# Patient Record
Sex: Female | Born: 1955 | Race: White | Hispanic: No | Marital: Married | Smoking: Current every day smoker
Health system: Southern US, Community
[De-identification: ages and names within clinical notes are randomized; demographics above are authoritative.]

## PROBLEM LIST (undated history)

## (undated) DIAGNOSIS — I471 Supraventricular tachycardia, unspecified: Secondary | ICD-10-CM

## (undated) DIAGNOSIS — C50919 Malignant neoplasm of unspecified site of unspecified female breast: Secondary | ICD-10-CM

## (undated) HISTORY — PX: TUBAL LIGATION: SHX77

## (undated) HISTORY — PX: CHOLECYSTECTOMY: SHX55

## (undated) HISTORY — PX: BACK SURGERY: SHX140

## (undated) HISTORY — PX: CARPAL TUNNEL RELEASE: SHX101

---

## 2015-08-24 ENCOUNTER — Emergency Department (HOSPITAL_COMMUNITY): Payer: Self-pay

## 2015-08-24 ENCOUNTER — Emergency Department (HOSPITAL_COMMUNITY)
Admission: EM | Admit: 2015-08-24 | Discharge: 2015-08-24 | Disposition: A | Discharge status: Home / Self Care | Payer: Self-pay | Attending: Emergency Medicine | Admitting: Emergency Medicine

## 2015-08-24 ENCOUNTER — Encounter (HOSPITAL_COMMUNITY): Payer: Self-pay | Admitting: Emergency Medicine

## 2015-08-24 DIAGNOSIS — Z8679 Personal history of other diseases of the circulatory system: Secondary | ICD-10-CM | POA: Insufficient documentation

## 2015-08-24 DIAGNOSIS — M7981 Nontraumatic hematoma of soft tissue: Secondary | ICD-10-CM | POA: Insufficient documentation

## 2015-08-24 DIAGNOSIS — F17200 Nicotine dependence, unspecified, uncomplicated: Secondary | ICD-10-CM | POA: Insufficient documentation

## 2015-08-24 DIAGNOSIS — M25532 Pain in left wrist: Secondary | ICD-10-CM | POA: Insufficient documentation

## 2015-08-24 DIAGNOSIS — T148XXA Other injury of unspecified body region, initial encounter: Secondary | ICD-10-CM

## 2015-08-24 HISTORY — DX: Supraventricular tachycardia: I47.1

## 2015-08-24 HISTORY — DX: Supraventricular tachycardia, unspecified: I47.10

## 2015-08-24 MED ORDER — ACETAMINOPHEN 325 MG PO TABS
650.0000 mg | ORAL_TABLET | Freq: Once | ORAL | Status: AC
  Administered 2015-08-24: 650 mg via ORAL
  Filled 2015-08-24: qty 2

## 2015-08-24 NOTE — ED Provider Notes (Signed)
CSN: [id number]     Arrival date & time 08/24/15  2007 History   First MD Initiated Contact with Patient 08/24/15 2131     Chief Complaint  Patient presents with  . Wrist Problem     (Consider location/radiation/quality/duration/timing/severity/associated sxs/prior Treatment) HPI Comments: Kristina Mcfarland is a 60 y.o. female with a PMHx of SVT with a significant PSHx of b/l carpal tunnel release, who presents to the ED with complaints of left wrist pain that began around 7 PM. Patient states she had shoveled snow earlier in the day, and approximately 1-2 hours after she finished she developed some itching in her left wrist, with down and noticed that it was mildly swollen. Eventually it turned into a bruise. She went to urgent care and was told to go to the ER for evaluation of a "burst blood vessel". She describes the pain as 8/10 constant throbbing in the left wrist, nonradiating, worse with palpation to the area, and with no treatments tried prior to arrival. Associated symptoms include bruising and swelling to the volar aspect of the wrist. She denies any loss of range of motion, weakness, numbness, tingling, erythema, or warmth. No skin injuries. She also denies fevers, chills, CP, SOB, recent travel/surgery/immobilization, estrogen use, active cancer, hx of DVT/PE, abd pain, N/V/D/C, hematuria, dysuria, myalgias, or rashes. No other arm swelling. +smoker. States she has no other medical conditions aside from SVT.  Patient is a 60 y.o. female presenting with wrist pain. The history is provided by the patient. No language interpreter was used.  Wrist Pain This is a new problem. The current episode started today. The problem occurs constantly. The problem has been unchanged. Associated symptoms include arthralgias (L wrist) and joint swelling (L wrist). Pertinent negatives include no abdominal pain, chest pain, chills, fever, myalgias, nausea, numbness, rash, vomiting or weakness. Exacerbated by:  palpation of wrist. She has tried nothing for the symptoms. The treatment provided no relief.    Past Medical History  Diagnosis Date  . SVT (supraventricular tachycardia) [facility])    Past Surgical History  Procedure Laterality Date  . Carpal tunnel release Bilateral   . Tubal ligation    . Back surgery    . Cholecystectomy     No family history on file. Social History  Substance Use Topics  . Smoking status: Current Every Day Smoker  . Smokeless tobacco: Not on file  . Alcohol Use: No   OB History    No data available     Review of Systems  Constitutional: Negative for fever and chills.  Respiratory: Negative for shortness of breath.   Cardiovascular: Negative for chest pain.  Gastrointestinal: Negative for nausea, vomiting, abdominal pain, diarrhea and constipation.  Genitourinary: Negative for dysuria and hematuria.  Musculoskeletal: Positive for joint swelling (L wrist) and arthralgias (L wrist). Negative for myalgias.  Skin: Positive for color change (bruising to L wrist). Negative for rash and wound.  Allergic/Immunologic: Negative for immunocompromised state.  Neurological: Negative for weakness and numbness.  Psychiatric/Behavioral: Negative for confusion.   10 Systems reviewed and are negative for acute change except as noted in the HPI.    Allergies  Other  Home Medications   Prior to Admission medications   Medication Sig Start Date End Date Taking? Authorizing Provider  Aspirin-Salicylamide-Caffeine (ARTHRITIS STRENGTH BC POWDER PO) Take 1 Package by mouth daily as needed (arthritis pain).   Yes Historical Provider, MD   BP 137/93 mmHg  Pulse 89  Temp(Src) 98.1 F (36.7 C) (Oral)  Resp 20  SpO2 97% Physical Exam  Constitutional: She is oriented to person, place, and time. Vital signs are normal. She appears well-developed and well-nourished.  Non-toxic appearance. No distress.  Afebrile, nontoxic, NAD  HENT:  Head: Normocephalic and atraumatic.    Mouth/Throat: Oropharynx is clear and moist and mucous membranes are normal.  Eyes: Conjunctivae and EOM are normal. Right eye exhibits no discharge. Left eye exhibits no discharge.  Neck: Normal range of motion. Neck supple.  Cardiovascular: Normal rate, regular rhythm, normal heart sounds and intact distal pulses.  Exam reveals no gallop and no friction rub.   No murmur heard. Pulmonary/Chest: Effort normal and breath sounds normal. No respiratory distress. She has no decreased breath sounds. She has no wheezes. She has no rhonchi. She has no rales.  Abdominal: Soft. Normal appearance and bowel sounds are normal. She exhibits no distension. There is no tenderness. There is no rigidity, no rebound, no guarding and no CVA tenderness.  Musculoskeletal: Normal range of motion.       Left wrist: She exhibits tenderness, bony tenderness and swelling. She exhibits normal range of motion, no crepitus, no deformity and no laceration.       Arms: L wrist with FROM intact, with bruising and superficial swelling at volar aspect of wrist over what appears to be a superficial vein and hematoma, no effusion or crepitus, no deformity, no skin injury or warmth, no erythema. Strength and sensation grossly intact, distal pulses intact, cap refill brisk and present. SEE PICTURE BELOW  Neurological: She is alert and oriented to person, place, and time. She has normal strength. No sensory deficit.  Skin: Skin is warm, dry and intact. Bruising noted. No rash noted.  L wrist bruise as noted above and pictured below  Psychiatric: She has a normal mood and affect.  Nursing note and vitals reviewed.     ED Course  Procedures (including critical care time) Labs Review Labs Reviewed - No data to display  Imaging Review Dg Wrist Complete Left  08/24/2015  CLINICAL DATA:  Acute left wrist pain and swelling without known injury. EXAM: LEFT WRIST - COMPLETE 3+ VIEW COMPARISON:  None. FINDINGS: There is no evidence of  fracture or dislocation. There is no evidence of arthropathy or other focal bone abnormality. Soft tissues are unremarkable. IMPRESSION: Normal left wrist. Electronically Signed   By: Marijo Conception, M.D.   On: 08/24/2015 22:13   I have personally reviewed and evaluated these images and lab results as part of my medical decision-making.   EKG Interpretation None      MDM   Final diagnoses:  Wrist pain, acute, left  Hematoma    61 y.o. female here with sudden onset L wrist pain, bruising, and hematoma swelling after she was shoveling snow. Went to urgent care and they told her that it was a burst blood vessel and to come to the ER for ongoing management. Nursing note states she was sent for blood clot r/o but pt denies this. On exam, bruising and swelling around superficial vein of volar aspect of wrist, mild TTP in wrist. ROM intact. NVI with soft compartments. No other arm swelling. No risk factors for DVT, highly doubt this. Will obtain xray and give tylenol (pt does not want narcotic since she has to drive tonight), and reassess shortly.   10:21 PM Xray negative. Likely burst blood vessel vs possible partially torn ligament from repetitive motions performed today. Will give velcro wrist splint as needed for comfort.  Tylenol/motrin as needed for pain. RICE discussed. F/up with hand specialist in 5-7 days for ongoing management. I explained the diagnosis and have given explicit precautions to return to the ER including for any other new or worsening symptoms. The patient understands and accepts the medical plan as it's been dictated and I have answered their questions. Discharge instructions concerning home care and prescriptions have been given. The patient is STABLE and is discharged to home in good condition.  BP 118/84 mmHg  Pulse 77  Temp(Src) 98.1 F (36.7 C) (Oral)  Resp 20  SpO2 97%  Meds ordered this encounter  Medications  . acetaminophen (TYLENOL) tablet 650 mg    Sig:       Montae Stager Camprubi-Soms, PA-C 08/24/15 2226  Dorie Rank, MD 08/25/15 [phone]

## 2015-08-24 NOTE — ED Notes (Signed)
Pt states that she noticed today that her anterior wrist is swollen, bruised and painful. Sent here from [facility] to rule out blood clot. Alert and oriented. Denies injury.

## 2015-08-24 NOTE — ED Notes (Signed)
Patient transported to X-ray 

## 2015-08-24 NOTE — Discharge Instructions (Signed)
Your symptoms are likely from a small burst blood vessel or could be from a torn ligament. Wear wrist brace for as needed for comfort. Ice and elevate wrist throughout the day. Alternate between tylenol and motrin as needed for pain. Call hand specialist follow up today or tomorrow to schedule followup appointment for recheck of ongoing wrist pain in 5-7 days. Return to the ER for changes or worsening symptoms.    Wrist Pain There are many things that can cause wrist pain. Some common causes include:  An injury to the wrist area.  Overuse of the joint.  A condition that causes too much pressure to be put on a nerve in the wrist (carpal tunnel syndrome).  Wear and tear of the joints that happens as a person gets older (osteoarthritis).  Other types of arthritis. Sometimes, the cause is not known. The pain often goes away when you follow instructions from your doctor about relieving pain at home. If your wrist pain does not go away, tests may need to be done to find the cause. HOME CARE Pay attention to any changes in your symptoms. Take these actions to help with your pain:  Rest your wrist for at least 48 hours or as told by your doctor.  If your doctor tells you to, put ice on the injured area:  Put ice in a plastic bag.  Place a towel between your skin and the bag.  Leave the ice on for 20 minutes, 2-3 times per day.  Keep your arm raised (elevated) above the level of your heart while you are sitting or lying down.  If a splint or elastic bandage has been put on the injured area:  Wear it as told by your doctor.  Take the splint or bandage off only as told by your doctor.  Loosen the splint or bandage if your fingers lose feeling (are numb) or have a tingling feeling, or if they turn cold or blue.  Take over-the-counter and prescription medicines only as told by your doctor.  Keep all follow-up visits as told by your doctor. This is important. GET HELP IF:  Your pain is  not helped by treatment.  Your pain gets worse. GET HELP RIGHT AWAY IF:   Your fingers swell.  Your fingers turn white, very red, or cold and blue.  Your fingers lose feeling or have a tingling feeling.  You have trouble moving your fingers.   This information is not intended to replace advice given to you by your health care provider. Make sure you discuss any questions you have with your health care provider.   Document Released: 01/17/2008 Document Revised: 04/21/2015 Document Reviewed: 12/16/2014 Elsevier Interactive Patient Education 2016 Elsevier [employer]  Cryotherapy Cryotherapy is when you put ice on your injury. Ice helps lessen pain and puffiness (swelling) after an injury. Ice works the best when you start using it in the first 24 to 48 hours after an injury. HOME CARE  Put a dry or damp towel between the ice pack and your skin.  You may press gently on the ice pack.  Leave the ice on for no more than 10 to 20 minutes at a time.  Check your skin after 5 minutes to make sure your skin is okay.  Rest at least 20 minutes between ice pack uses.  Stop using ice when your skin loses feeling (numbness).  Do not use ice on someone who cannot tell you when it hurts. This includes small children and people  with memory problems (dementia). GET HELP RIGHT AWAY IF:  You have white spots on your skin.  Your skin turns blue or pale.  Your skin feels waxy or hard.  Your puffiness gets worse. MAKE SURE YOU:   Understand these instructions.  Will watch your condition.  Will get help right away if you are not doing well or get worse.   This information is not intended to replace advice given to you by your health care provider. Make sure you discuss any questions you have with your health care provider.   Document Released: 01/17/2008 Document Revised: 10/23/2011 Document Reviewed: 03/23/2011 Elsevier Interactive Patient Education [employer].

## 2017-08-14 DIAGNOSIS — C50919 Malignant neoplasm of unspecified site of unspecified female breast: Secondary | ICD-10-CM

## 2017-08-14 HISTORY — DX: Malignant neoplasm of unspecified site of unspecified female breast: C50.919

## 2018-04-12 ENCOUNTER — Telehealth (HOSPITAL_COMMUNITY): Payer: Self-pay | Admitting: *Deleted

## 2018-04-12 NOTE — Telephone Encounter (Signed)
error 

## 2018-08-08 ENCOUNTER — Emergency Department (HOSPITAL_COMMUNITY): Payer: Self-pay

## 2018-08-08 ENCOUNTER — Other Ambulatory Visit: Payer: Self-pay

## 2018-08-08 ENCOUNTER — Encounter (HOSPITAL_COMMUNITY): Payer: Self-pay | Admitting: Emergency Medicine

## 2018-08-08 ENCOUNTER — Observation Stay (HOSPITAL_COMMUNITY): Payer: Self-pay

## 2018-08-08 ENCOUNTER — Ambulatory Visit
Admit: 2018-08-08 | Discharge: 2018-08-08 | Disposition: A | Discharge status: Home / Self Care | Payer: Self-pay | Source: Ambulatory Visit | Attending: Radiation Oncology | Admitting: Radiation Oncology

## 2018-08-08 ENCOUNTER — Inpatient Hospital Stay (HOSPITAL_COMMUNITY)
Admission: EM | Admit: 2018-08-08 | Discharge: 2018-08-18 | DRG: 478 | Disposition: A | Discharge status: Home / Self Care | Payer: Self-pay | Attending: Internal Medicine | Admitting: Internal Medicine

## 2018-08-08 DIAGNOSIS — M5441 Lumbago with sciatica, right side: Secondary | ICD-10-CM

## 2018-08-08 DIAGNOSIS — T380X5A Adverse effect of glucocorticoids and synthetic analogues, initial encounter: Secondary | ICD-10-CM | POA: Diagnosis present

## 2018-08-08 DIAGNOSIS — K5903 Drug induced constipation: Secondary | ICD-10-CM | POA: Diagnosis present

## 2018-08-08 DIAGNOSIS — D72829 Elevated white blood cell count, unspecified: Secondary | ICD-10-CM | POA: Diagnosis present

## 2018-08-08 DIAGNOSIS — M8458XA Pathological fracture in neoplastic disease, other specified site, initial encounter for fracture: Secondary | ICD-10-CM | POA: Diagnosis present

## 2018-08-08 DIAGNOSIS — R590 Localized enlarged lymph nodes: Secondary | ICD-10-CM | POA: Diagnosis present

## 2018-08-08 DIAGNOSIS — F17210 Nicotine dependence, cigarettes, uncomplicated: Secondary | ICD-10-CM | POA: Diagnosis present

## 2018-08-08 DIAGNOSIS — R627 Adult failure to thrive: Secondary | ICD-10-CM | POA: Diagnosis present

## 2018-08-08 DIAGNOSIS — G893 Neoplasm related pain (acute) (chronic): Secondary | ICD-10-CM

## 2018-08-08 DIAGNOSIS — Z515 Encounter for palliative care: Secondary | ICD-10-CM

## 2018-08-08 DIAGNOSIS — Z6821 Body mass index (BMI) 21.0-21.9, adult: Secondary | ICD-10-CM

## 2018-08-08 DIAGNOSIS — I471 Supraventricular tachycardia, unspecified: Secondary | ICD-10-CM | POA: Diagnosis present

## 2018-08-08 DIAGNOSIS — N6320 Unspecified lump in the left breast, unspecified quadrant: Secondary | ICD-10-CM

## 2018-08-08 DIAGNOSIS — C50912 Malignant neoplasm of unspecified site of left female breast: Secondary | ICD-10-CM | POA: Diagnosis present

## 2018-08-08 DIAGNOSIS — M549 Dorsalgia, unspecified: Secondary | ICD-10-CM | POA: Diagnosis present

## 2018-08-08 DIAGNOSIS — Z23 Encounter for immunization: Secondary | ICD-10-CM

## 2018-08-08 DIAGNOSIS — R945 Abnormal results of liver function studies: Secondary | ICD-10-CM | POA: Diagnosis present

## 2018-08-08 DIAGNOSIS — T40605A Adverse effect of unspecified narcotics, initial encounter: Secondary | ICD-10-CM | POA: Diagnosis present

## 2018-08-08 DIAGNOSIS — C7951 Secondary malignant neoplasm of bone: Principal | ICD-10-CM | POA: Diagnosis present

## 2018-08-08 DIAGNOSIS — C799 Secondary malignant neoplasm of unspecified site: Secondary | ICD-10-CM

## 2018-08-08 DIAGNOSIS — K5900 Constipation, unspecified: Secondary | ICD-10-CM

## 2018-08-08 DIAGNOSIS — R7989 Other specified abnormal findings of blood chemistry: Secondary | ICD-10-CM

## 2018-08-08 DIAGNOSIS — Z7189 Other specified counseling: Secondary | ICD-10-CM

## 2018-08-08 DIAGNOSIS — R911 Solitary pulmonary nodule: Secondary | ICD-10-CM

## 2018-08-08 HISTORY — DX: Malignant neoplasm of unspecified site of unspecified female breast: C50.919

## 2018-08-08 LAB — PROTIME-INR
INR: 1.06
Prothrombin Time: 13.7 seconds (ref 11.4–15.2)

## 2018-08-08 LAB — CBC WITH DIFFERENTIAL/PLATELET
Abs Immature Granulocytes: 0.08 10*3/uL — ABNORMAL HIGH (ref 0.00–0.07)
Basophils Absolute: 0 10*3/uL (ref 0.0–0.1)
Basophils Relative: 1 %
EOS ABS: 0.1 10*3/uL (ref 0.0–0.5)
EOS PCT: 1 %
HEMATOCRIT: 43.5 % (ref 36.0–46.0)
Hemoglobin: 14.1 g/dL (ref 12.0–15.0)
IMMATURE GRANULOCYTES: 1 %
LYMPHS ABS: 2.2 10*3/uL (ref 0.7–4.0)
Lymphocytes Relative: 26 %
MCH: 28.8 pg (ref 26.0–34.0)
MCHC: 32.4 g/dL (ref 30.0–36.0)
MCV: 89 fL (ref 80.0–100.0)
MONOS PCT: 9 %
Monocytes Absolute: 0.8 10*3/uL (ref 0.1–1.0)
Neutro Abs: 5.2 10*3/uL (ref 1.7–7.7)
Neutrophils Relative %: 62 %
Platelets: 213 10*3/uL (ref 150–400)
RBC: 4.89 MIL/uL (ref 3.87–5.11)
RDW: 16.2 % — AB (ref 11.5–15.5)
WBC: 8.4 10*3/uL (ref 4.0–10.5)
nRBC: 0 % (ref 0.0–0.2)

## 2018-08-08 LAB — COMPREHENSIVE METABOLIC PANEL
ALBUMIN: 4.1 g/dL (ref 3.5–5.0)
ALK PHOS: 134 U/L — AB (ref 38–126)
ALT: 50 U/L — AB (ref 0–44)
AST: 54 U/L — ABNORMAL HIGH (ref 15–41)
Anion gap: 12 (ref 5–15)
BUN: 18 mg/dL (ref 8–23)
CALCIUM: 10.1 mg/dL (ref 8.9–10.3)
CO2: 20 mmol/L — AB (ref 22–32)
CREATININE: 0.63 mg/dL (ref 0.44–1.00)
Chloride: 108 mmol/L (ref 98–111)
GFR calc Af Amer: >60 mL/min (ref >60)
GFR calc non Af Amer: >60 mL/min (ref >60)
GLUCOSE: 105 mg/dL — AB (ref 70–99)
Potassium: 3.5 mmol/L (ref 3.5–5.1)
SODIUM: 140 mmol/L (ref 135–145)
Total Bilirubin: 0.5 mg/dL (ref 0.3–1.2)
Total Protein: 6.8 g/dL (ref 6.5–8.1)

## 2018-08-08 LAB — PHOSPHORUS: PHOSPHORUS: 4.9 mg/dL — AB (ref 2.5–4.6)

## 2018-08-08 LAB — MAGNESIUM: Magnesium: 2.3 mg/dL (ref 1.7–2.4)

## 2018-08-08 MED ORDER — POTASSIUM CHLORIDE IN NACL 20-0.9 MEQ/L-% IV SOLN
INTRAVENOUS | Status: DC
  Administered 2018-08-08 – 2018-08-09 (×2): via INTRAVENOUS
  Filled 2018-08-08 (×2): qty 1000

## 2018-08-08 MED ORDER — DEXAMETHASONE SODIUM PHOSPHATE 10 MG/ML IJ SOLN
10.0000 mg | Freq: Once | INTRAMUSCULAR | Status: AC
  Administered 2018-08-08: 10 mg via INTRAVENOUS
  Filled 2018-08-08: qty 1

## 2018-08-08 MED ORDER — HYDROMORPHONE HCL 1 MG/ML IJ SOLN
0.5000 mg | Freq: Once | INTRAMUSCULAR | Status: AC
  Administered 2018-08-08: 0.5 mg via INTRAVENOUS
  Filled 2018-08-08: qty 1

## 2018-08-08 MED ORDER — DEXAMETHASONE 4 MG PO TABS
8.0000 mg | ORAL_TABLET | Freq: Three times a day (TID) | ORAL | Status: DC
  Administered 2018-08-08 – 2018-08-18 (×30): 8 mg via ORAL
  Filled 2018-08-08 (×31): qty 2

## 2018-08-08 MED ORDER — KETOROLAC TROMETHAMINE 30 MG/ML IJ SOLN
30.0000 mg | Freq: Once | INTRAMUSCULAR | Status: AC
  Administered 2018-08-08: 30 mg via INTRAVENOUS
  Filled 2018-08-08: qty 1

## 2018-08-08 MED ORDER — METHOCARBAMOL 1000 MG/10ML IJ SOLN
1000.0000 mg | Freq: Once | INTRAMUSCULAR | Status: AC
  Administered 2018-08-08: 1000 mg via INTRAVENOUS
  Filled 2018-08-08: qty 10

## 2018-08-08 MED ORDER — KETOROLAC TROMETHAMINE 30 MG/ML IJ SOLN
30.0000 mg | Freq: Four times a day (QID) | INTRAMUSCULAR | Status: DC | PRN
  Administered 2018-08-08: 30 mg via INTRAVENOUS
  Filled 2018-08-08: qty 1

## 2018-08-08 MED ORDER — ONDANSETRON HCL 4 MG/2ML IJ SOLN
4.0000 mg | Freq: Once | INTRAMUSCULAR | Status: AC
  Administered 2018-08-08: 4 mg via INTRAVENOUS
  Filled 2018-08-08: qty 2

## 2018-08-08 MED ORDER — HEPARIN SODIUM (PORCINE) 5000 UNIT/ML IJ SOLN
5000.0000 [IU] | Freq: Three times a day (TID) | INTRAMUSCULAR | Status: DC
  Administered 2018-08-08: 5000 [IU] via SUBCUTANEOUS
  Filled 2018-08-08: qty 1

## 2018-08-08 MED ORDER — ONDANSETRON HCL 4 MG PO TABS
4.0000 mg | ORAL_TABLET | Freq: Four times a day (QID) | ORAL | Status: DC | PRN
  Administered 2018-08-16: 4 mg via ORAL
  Filled 2018-08-08: qty 1

## 2018-08-08 MED ORDER — FAMOTIDINE 20 MG PO TABS
20.0000 mg | ORAL_TABLET | Freq: Two times a day (BID) | ORAL | Status: DC
  Administered 2018-08-08 – 2018-08-18 (×21): 20 mg via ORAL
  Filled 2018-08-08 (×21): qty 1

## 2018-08-08 MED ORDER — LORAZEPAM 2 MG/ML IJ SOLN
0.5000 mg | Freq: Once | INTRAMUSCULAR | Status: AC
  Administered 2018-08-08: 0.5 mg via INTRAVENOUS

## 2018-08-08 MED ORDER — LORAZEPAM 2 MG/ML IJ SOLN
INTRAMUSCULAR | Status: AC
  Administered 2018-08-08: 0.5 mg via INTRAVENOUS
  Filled 2018-08-08: qty 1

## 2018-08-08 MED ORDER — SODIUM CHLORIDE (PF) 0.9 % IJ SOLN
INTRAMUSCULAR | Status: AC
  Filled 2018-08-08: qty 50

## 2018-08-08 MED ORDER — IOHEXOL 300 MG/ML  SOLN
100.0000 mL | Freq: Once | INTRAMUSCULAR | Status: AC | PRN
  Administered 2018-08-08: 100 mL via INTRAVENOUS

## 2018-08-08 MED ORDER — ENSURE ENLIVE PO LIQD
237.0000 mL | Freq: Two times a day (BID) | ORAL | Status: DC
  Administered 2018-08-09 – 2018-08-14 (×9): 237 mL via ORAL

## 2018-08-08 MED ORDER — HYDROMORPHONE HCL 1 MG/ML IJ SOLN
1.0000 mg | Freq: Once | INTRAMUSCULAR | Status: AC
  Administered 2018-08-08: 1 mg via INTRAVENOUS
  Filled 2018-08-08: qty 1

## 2018-08-08 MED ORDER — HYDROMORPHONE HCL 1 MG/ML IJ SOLN
1.0000 mg | INTRAMUSCULAR | Status: DC | PRN
  Administered 2018-08-08 – 2018-08-09 (×2): 1 mg via INTRAVENOUS
  Filled 2018-08-08 (×2): qty 1

## 2018-08-08 MED ORDER — ONDANSETRON HCL 4 MG/2ML IJ SOLN
4.0000 mg | Freq: Four times a day (QID) | INTRAMUSCULAR | Status: DC | PRN
  Administered 2018-08-09 – 2018-08-15 (×4): 4 mg via INTRAVENOUS
  Filled 2018-08-08 (×4): qty 2

## 2018-08-08 MED ORDER — ALPRAZOLAM 0.5 MG PO TABS
0.5000 mg | ORAL_TABLET | Freq: Every evening | ORAL | Status: DC | PRN
  Administered 2018-08-08 – 2018-08-10 (×3): 0.5 mg via ORAL
  Filled 2018-08-08 (×3): qty 1

## 2018-08-08 NOTE — ED Notes (Signed)
ED TO INPATIENT HANDOFF REPORT  Name/Age/Gender Kristina Mcfarland 62 y.o. female  Code Status   Home/SNF/Other Home  Chief Complaint Back Pain  Level of Care/Admitting Diagnosis ED Disposition    ED Disposition Condition [facility]: [place] [[id number]]  Level of Care: Med-Surg [16]  Diagnosis: Breast cancer metastasized to bone [facility]) [[id number]]  Admitting Physician: Reubin Milan [[id number]]  Attending Physician: Reubin Milan [[id number]]  PT Class (Do Not Modify): Observation [104]  PT Acc Code (Do Not Modify): Observation [10022]       Medical History Past Medical History:  Diagnosis Date  . Breast cancer ([place]) 2019  . SVT (supraventricular tachycardia) ([facility])     Allergies Allergies  Allergen Reactions  . Oxycodone-Acetaminophen Nausea And Vomiting  . Other     Medication for SVT, Anderal?    IV Location/Drains/Wounds Patient Lines/Drains/Airways Status   Active Line/Drains/Airways    Name:   Placement date:   Placement time:   Site:   Days:   Peripheral IV 08/08/18 Right Forearm   08/08/18    0653    Forearm   less than 1          Labs/Imaging Results for orders placed or performed during the hospital encounter of 08/08/18 (from the past 48 hour(s))  Comprehensive metabolic panel     Status: Abnormal   Collection Time: 08/08/18  6:47 AM  Result Value Ref Range   Sodium 140 135 - 145 mmol/L   Potassium 3.5 3.5 - 5.1 mmol/L   Chloride 108 98 - 111 mmol/L   CO2 20 (L) 22 - 32 mmol/L   Glucose, Bld 105 (H) 70 - 99 mg/dL   BUN 18 8 - 23 mg/dL   Creatinine, Ser 0.63 0.44 - 1.00 mg/dL   Calcium 10.1 8.9 - 10.3 mg/dL   Total Protein 6.8 6.5 - 8.1 g/dL   Albumin 4.1 3.5 - 5.0 g/dL   AST 54 (H) 15 - 41 U/L   ALT 50 (H) 0 - 44 U/L   Alkaline Phosphatase 134 (H) 38 - 126 U/L   Total Bilirubin 0.5 0.3 - 1.2 mg/dL   GFR calc non Af Amer >60 >60 mL/min   GFR calc Af Amer >60 >60 mL/min   Anion gap 12 5 - 15     Comment: Performed at [facility], [place] [address] [place], [place] [address]  CBC with Differential     Status: Abnormal   Collection Time: 08/08/18  6:47 AM  Result Value Ref Range   WBC 8.4 4.0 - 10.5 K/uL   RBC 4.89 3.87 - 5.11 MIL/uL   Hemoglobin 14.1 12.0 - 15.0 g/dL   HCT 43.5 36.0 - 46.0 %   MCV 89.0 80.0 - 100.0 fL   MCH 28.8 26.0 - 34.0 pg   MCHC 32.4 30.0 - 36.0 g/dL   RDW 16.2 (H) 11.5 - 15.5 %   Platelets 213 150 - 400 K/uL   nRBC 0.0 0.0 - 0.2 %   Neutrophils Relative % 62 %   Neutro Abs 5.2 1.7 - 7.7 K/uL   Lymphocytes Relative 26 %   Lymphs Abs 2.2 0.7 - 4.0 K/uL   Monocytes Relative 9 %   Monocytes Absolute 0.8 0.1 - 1.0 K/uL   Eosinophils Relative 1 %   Eosinophils Absolute 0.1 0.0 - 0.5 K/uL   Basophils Relative 1 %   Basophils Absolute 0.0 0.0 - 0.1 K/uL   Immature  Granulocytes 1 %   Abs Immature Granulocytes 0.08 (H) 0.00 - 0.07 K/uL    Comment: Performed at [facility], [place] [address] [place], [place] [address]   Ct Chest Wo Contrast  Result Date: 08/08/2018 CLINICAL DATA:  Evaluate lung nodule EXAM: CT CHEST WITHOUT CONTRAST TECHNIQUE: Multidetector CT imaging of the chest was performed following the standard protocol without IV contrast. COMPARISON:  None available FINDINGS: Cardiovascular: No significant vascular findings. Normal heart size. No pericardial effusion. Mediastinum/Nodes: Noncalcified normal size mediastinal lymph nodes Lungs/Pleura: 6 mm nodule in the RIGHT upper lobe (image 61/5) Upper Abdomen: Limited view of the liver, kidneys, pancreas are unremarkable. Normal adrenal glands. Musculoskeletal: Extensive lytic skeletal metastasis throughout the thoracic spine. Several rib tubal bodies are nearly completely replaced by lytic process (for example T1). Example lesion at T8 extends through the pedicle measures 2.3 cm. Multiple lytic lesions within the ribs and several pathologic fractures. Lytic lesion within  the manubrium additionally. IMPRESSION: 1. Indeterminate RIGHT upper lobe pulmonary nodule. In patient with metastatic breast cancer, recommend short-term CT follow-up (3 months). 2. Calcified mediastinal lymph nodes could represent breast cancer metastasis, potentially treatment effect. 3. Extensive expansile metastatic lytic lesions throughout the spine and ribs. Electronically Signed   By: Suzy Bouchard M.D.   On: 08/08/2018 08:08   Mr Lumbar Spine Wo Contrast  Result Date: 08/08/2018 CLINICAL DATA:  Low back hip pain. EXAM: MRI LUMBAR SPINE WITHOUT CONTRAST TECHNIQUE: Multiplanar, multisequence MR imaging of the lumbar spine was performed. No intravenous contrast was administered. COMPARISON:  Overlapping portions from CT chest dated 08/08/2018 FINDINGS: Segmentation: The lowest lumbar type non-rib-bearing vertebra is labeled as L5. Alignment:  No vertebral subluxation is observed. Vertebrae: Extensive tumor infiltration of the visualized osseous structures. There are bulging margins of a variety of cortical surfaces including posteriorly and anteriorly along the sacrum, posteriorly along the L2 vertebral body eccentric to the right, and posteriorly along the L3 vertebral body as well as in both iliac bones, suspicious for malignancy. Schmorl's nodes or small central endplate fractures along the superior and inferior endplates of L3 raise the possibility that the posterior bulging at L3 could be from mild posterior bony retropulsion. Heterogeneous lesion throughout the posterior elements of the lumbar spine. Incidental 2.2 cm hemangioma the L2 vertebral body. Congenitally short pedicles. Conus medullaris and cauda equina: Conus extends to the L1 level. Conus and cauda equina appear normal. Paraspinal and other soft tissues: Unremarkable Disc levels: L1-2: Unremarkable. L2-3: Mild right subarticular lateral recess stenosis due to posterior tumor extension along the vertebral margin, epidural extension of  tumor is a distinct possibility. Borderline central narrowing of the thecal sac. L3-4: Posterior bony convexity at L3 potentially from epidural tumor or cortical bulging from tumor. Prominent central narrowing of the thecal sac primarily from short pedicles. L4-5: Prominent central narrowing of the thecal sac along with mild bilateral foraminal stenosis primarily from short pedicles, disc bulge, and facet arthropathy. L5-S1: Borderline right foraminal stenosis due to facet and intervertebral spurring. Suspected prior right laminectomy. IMPRESSION: 1. Nearly diffuse malignant involvement of the visualized bony structures. Posterior bony convexity at multiple levels especially L2 and L3 and also in the sacrum possibly from epidural extension of tumor as well as vertebral expansion from tumor. Given the left axillary adenopathy in left posterior breast mass on prior CT chest, this is presumably from metastatic breast cancer, correlate with patient history. 2. Lumbar spondylosis, degenerative disc disease, and congenitally short pedicles contribute to prominent central narrowing  of the thecal sac L3-4 and L4-5 allow with mild foraminal impingement at L2-3 and L4-5, as detailed above. Electronically Signed   By: Van Clines M.D.   On: 08/08/2018 09:53    Pending Labs Unresulted Labs (From admission, onward)   None      Vitals/Pain Today's Vitals   08/08/18 1138 08/08/18 1149 08/08/18 1200 08/08/18 1209  BP: 137/67  119/67   Pulse: 75  65   Resp: 16  16   Temp:      TempSrc:      SpO2: 94%  93%   Weight:      Height:      PainSc: 4  Asleep Asleep Asleep    Isolation Precautions No active isolations  Medications Medications  ondansetron (ZOFRAN) injection 4 mg (4 mg Intravenous Given 08/08/18 0654)  HYDROmorphone (DILAUDID) injection 1 mg (1 mg Intravenous Given 08/08/18 0654)  methocarbamol (ROBAXIN) 1,000 mg in dextrose 5 % 50 mL IVPB (0 mg Intravenous Stopped 08/08/18 0748)   ketorolac (TORADOL) 30 MG/ML injection 30 mg (30 mg Intravenous Given 08/08/18 0654)  LORazepam (ATIVAN) injection 0.5 mg (0.5 mg Intravenous Given 08/08/18 0904)  HYDROmorphone (DILAUDID) injection 0.5 mg (0.5 mg Intravenous Given 08/08/18 1138)  dexamethasone (DECADRON) injection 10 mg (10 mg Intravenous Given 08/08/18 1151)    Mobility walks

## 2018-08-08 NOTE — Progress Notes (Signed)
Aware of IR request for possible image-guided biopsy (lymph node vs bone vs breast mass).  Images reviewed by Dr. Pascal Lux who recommends CT chest/abdomen/pelvis (with contrast) to assess best location to biopsy. Anticipate for procedure 08/09/2018 pending CT. Will make NPO at midnight and hold all blood thinners. INR pending. PA to consent 08/09/2018 pending CT. Patient aware of plan.  Bea Graff Raymund Manrique, PA-C 08/08/2018, 4:15 PM

## 2018-08-08 NOTE — ED Notes (Signed)
Per Pt: Pt can tolerate MRI. Pt is alert and oriented.

## 2018-08-08 NOTE — ED Notes (Signed)
Patient transported to MRI 

## 2018-08-08 NOTE — ED Notes (Signed)
MRI called and informed RN that patient is now claustrophobic. RN informed Primary RN and Primary RN is informing ED Provider now.

## 2018-08-08 NOTE — ED Provider Notes (Signed)
[facility] DEPT Provider Note   CSN: [id number] Arrival date & time: 08/08/18  0551     History   Chief Complaint Chief Complaint  Patient presents with  . Back Pain    HPI Kristina Mcfarland is a 62 y.o. female.  The history is provided by the patient.  She comes in complaining of severe right lumbar pain radiating down the right thigh and into the right calf.  She has been having ongoing pain for the last several months and it has been getting worse.  She has been taking hydrocodone-acetaminophen which gives slight, temporary relief.  She has also been taking BC powder.  She is often take any other medication.  She rates pain at 10/10.  This morning, she was unable to get up out of bed because of pain.  Pain seems to be worse with any movement, nothing seems to make it better.  She has had numbness in the same area.  She denies urinary or fecal incontinence.  She has been having problems with constipation.  She has been seeing an orthopedic doctor, but states that she has not been getting relief with the treatments.  She was recently found to have breast lumps with some enlarged lymph nodes in her neck on the left side and in the left axilla and was supposed to go for biopsy, but missed that appointment.  She does endorse approximately 30 pound weight loss over the last 6 months, and also has had night sweats.  Past Medical History:  Diagnosis Date  . Breast cancer ([place]) 2019  . SVT (supraventricular tachycardia) ([facility])     There are no active problems to display for this patient.   Past Surgical History:  Procedure Laterality Date  . BACK SURGERY    . CARPAL TUNNEL RELEASE Bilateral   . CHOLECYSTECTOMY    . TUBAL LIGATION       OB History   No obstetric history on file.      Home Medications    Prior to Admission medications   Medication Sig Start Date End Date Taking? Authorizing Provider  Aspirin-Salicylamide-Caffeine (ARTHRITIS STRENGTH  BC POWDER PO) Take 1 Package by mouth daily as needed (arthritis pain).    [provider]    Family History No family history on file.  Social History Social History   Tobacco Use  . Smoking status: Current Every Day Smoker  Substance Use Topics  . Alcohol use: No  . Drug use: No     Allergies   Other   Review of Systems Review of Systems  All other systems reviewed and are negative.    Physical Exam Updated Vital Signs BP (!) 154/93 (BP Location: Right Arm)   Pulse 79   Temp 97.6 F (36.4 C) (Oral)   Resp 16   Ht 5\' 2"  (1.575 m)   Wt 54.4 kg   SpO2 97%   BMI 21.95 kg/m   Physical Exam Vitals signs and nursing note reviewed.    62 year old female, laying almost in a fetal position in the left lateral decubitus position and appears uncomfortable, but is in no acute distress. Vital signs are significant for elevated blood pressure. Oxygen saturation is 97%, which is normal. Head is normocephalic and atraumatic. PERRLA, EOMI. Oropharynx is clear. Neck has moderate tenderness rather diffusely in the lower cervical region. Back has moderate to severe tenderness rather diffusely throughout the lumbar region and extending up into the thoracic region.  There is no  point tenderness.  Unable to perform straight leg raise due to baseline pain.  There is no CVA tenderness. Lungs are clear without rales, wheezes, or rhonchi. Chest is nontender.  Left supraclavicular and axillary lymph nodes are palpable.  Left axillary lymph nodes are tender.  There hard, but movable measure up to about 1 cm. Heart has regular rate and rhythm without murmur. Abdomen is soft, flat, nontender without masses or hepatosplenomegaly and peristalsis is normoactive. Extremities have no cyanosis or edema. Skin is warm and dry without rash. Neurologic: Mental status is normal, cranial nerves are intact, there are no motor or sensory deficits.  ED Treatments / Results  Labs (all labs  ordered are listed, but only abnormal results are displayed) Labs Reviewed - No data to display  EKG None  Radiology No results found.  Procedures Procedures  Medications Ordered in ED Medications - No data to display   Initial Impression / Assessment and Plan / ED Course  I have reviewed the triage vital signs and the nursing notes.  Pertinent labs & imaging results that were available during my care of the patient were reviewed by me and considered in my medical decision making (see chart for details).  Low back pain with sciatica.  I have reviewed her records on care everywhere, and she has been having musculoskeletal pain since April.  Lumbar spine x-rays showed spondylolysis.  She had cervical x-rays which showed degenerative changes.  Chest x-ray showed possible lung nodule.  Mammogram and breast ultrasound showed 3 rest nodules on the left and enlarged lymph nodes.  CBC and chemistry profile in August were unremarkable.  She also had ED visits for chest wall pain.  Over approximately the last 8 months, she has had new onset of back, neck, rib cage pain, weight loss, night sweats.  This is certainly concerning for metastatic cancer.  Of note, she does smoke 1 pack of cigarettes a day.  It is possible that her low back pain is strictly musculoskeletal, but I do believe that metastatic disease needs to be ruled out.  She will be sent for MRI of the lumbar spine, will get CT of her chest, which would include visualization of thoracic spine and portions of the cervical and lumbar spine.  In the meantime, she will be given hydromorphone for pain and will be given methocarbamol to try to treat muscle spasm.  Case is signed out to Dr. Winfred Leeds.  Final Clinical Impressions(s) / ED Diagnoses   Final diagnoses:  Low back pain with right-sided sciatica, unspecified back pain laterality, unspecified chronicity  Left breast mass  Lung nodule    ED Discharge Orders    None       Delora Fuel, MD 05/69/79 [id number]

## 2018-08-08 NOTE — ED Notes (Signed)
Bed: WA14 Expected date:  Expected time:  Means of arrival:  Comments: EMS 

## 2018-08-08 NOTE — H&P (Signed)
History and Physical    Kristina Mcfarland VEL:[id number] DOB: March 23, 1956 DOA: 08/08/2018  PCP: Willeen Niece, PA   Patient coming from: Home.  I have personally briefly reviewed patient's old medical records in [place]  Chief Complaint: Back pain.  HPI: Kristina Mcfarland is a 62 y.o. female with medical history significant of breast cancer diagnosed in August after mammogram who is coming to the emergency department with complaints of progressively worse back pain radiated to her legs, particularly her right side, for 5 to 6 weeks.  She also complains of having LUE numbness and weakness.  She denies fever, chills, headache, sore throat, rhinorrhea, dyspnea, chest pain, palpitations, dizziness, diaphoresis, orthopnea, PND or lower extremity edema.  No abdominal pain, nausea, emesis, diarrhea, constipation, melena or hematochezia.  No dysuria, frequency or hematuria.  No fecal or urinary incontinence.  Denies polyuria, polydipsia, polyphagia or blurred vision.  ED Course: Initial vital signs temperature 36.4 F, pulse 79, respirations 16, blood pressure 154/93 mmHg and O2 sat 98% on room air.  White count is 8.4, hemoglobin 14.1 and platelets 213.  PT and INR were normal.  CO2 is 20 mmol/L and phosphorus 4.9 mg/dL.  All other electrolytes are within normal limits.  Glucose, nonfasting, was 105 mg/dL.  Normal renal function, total protein and albumin.  However AST is 54, ALT 50 and alkaline phosphatase 134 units/L.  The [place] Hives is normal.  Review of Systems: As per HPI otherwise 10 point review of systems negative.  Past Medical History:  Diagnosis Date  . Breast cancer ([place]) 2019  . SVT (supraventricular tachycardia) (HCC)     Past Surgical History:  Procedure Laterality Date  . BACK SURGERY    . CARPAL TUNNEL RELEASE Bilateral   . CHOLECYSTECTOMY    . TUBAL LIGATION       reports that she has been smoking. She does not have any smokeless tobacco history on file. She reports that  she does not drink alcohol or use drugs.  Allergies  Allergen Reactions  . Oxycodone-Acetaminophen Nausea And Vomiting  . Other     Medication for SVT, Anderal?   Family history. MGM- Hypertension  Prior to Admission medications   Medication Sig Start Date End Date Taking? Authorizing Provider  Aspirin-Salicylamide-Caffeine (ARTHRITIS STRENGTH BC POWDER PO) Take 1 Package by mouth daily as needed (arthritis pain).   Yes [provider]  MELATONIN PO Take 1 tablet by mouth at bedtime as needed (sleep).   Yes [provider]    Physical Exam: Vitals:   08/08/18 1138 08/08/18 1200 08/08/18 1304 08/08/18 1404  BP: 137/67 119/67 110/70 117/66  Pulse: 75 65 65 70  Resp: 16 16  16   Temp:   98.3 F (36.8 C) 98 F (36.7 C)  TempSrc:   Oral Oral  SpO2: 94% 93% 97% 98%  Weight:    54.4 kg  Height:    5\' 2"  (1.575 m)    Constitutional: Looks chronically ill.  NAD, calm, comfortable Eyes: PERRL, lids and conjunctivae normal ENMT: Mucous membranes are moist. Posterior pharynx clear of any exudate or lesions. Neck: normal, supple, no masses, no thyromegaly Respiratory: clear to auscultation bilaterally, no wheezing, no crackles. Normal respiratory effort. No accessory muscle use.  Cardiovascular: Regular rate and rhythm, no murmurs / rubs / gallops. No extremity edema. 2+ pedal pulses. No carotid bruits.  Abdomen: Soft, no tenderness, no masses palpated. No hepatosplenomegaly. Bowel sounds positive.  Musculoskeletal: no clubbing / cyanosis.  Good ROM, no contractures.  Normal muscle tone.  Skin: no rashes, lesions, ulcers on limited dermatological examination. Neurologic: CN 2-12 grossly intact. Sensation intact, DTR normal.  4.5/5 weakness on LUE.  Positive tremor on left upper extremity, when checking pronator drift, Psychiatric: Normal judgment and insight. Alert and oriented x 3. Normal mood.   Labs on Admission: I have personally reviewed following labs and imaging  studies  CBC: Recent Labs  Lab 08/08/18 0647  WBC 8.4  NEUTROABS 5.2  HGB 14.1  HCT 43.5  MCV 89.0  PLT 503   Basic Metabolic Panel: Recent Labs  Lab 08/08/18 0647 08/08/18 0700  NA 140  --   K 3.5  --   CL 108  --   CO2 20*  --   GLUCOSE 105*  --   BUN 18  --   CREATININE 0.63  --   CALCIUM 10.1  --   MG  --  2.3  PHOS  --  4.9*   GFR: Estimated Creatinine Clearance: 57.7 mL/min (by C-G formula based on SCr of 0.63 mg/dL). Liver Function Tests: Recent Labs  Lab 08/08/18 0647  AST 54*  ALT 50*  ALKPHOS 134*  BILITOT 0.5  PROT 6.8  ALBUMIN 4.1   No results for input(s): LIPASE, AMYLASE in the last 168 hours. No results for input(s): AMMONIA in the last 168 hours. Coagulation Profile: Recent Labs  Lab 08/08/18 1632  INR 1.06   Cardiac Enzymes: No results for input(s): CKTOTAL, CKMB, CKMBINDEX, TROPONINI in the last 168 hours. BNP (last 3 results) No results for input(s): PROBNP in the last 8760 hours. HbA1C: No results for input(s): HGBA1C in the last 72 hours. CBG: No results for input(s): GLUCAP in the last 168 hours. Lipid Profile: No results for input(s): CHOL, HDL, LDLCALC, TRIG, CHOLHDL, LDLDIRECT in the last 72 hours. Thyroid Function Tests: No results for input(s): TSH, T4TOTAL, FREET4, T3FREE, THYROIDAB in the last 72 hours. Anemia Panel: No results for input(s): VITAMINB12, FOLATE, FERRITIN, TIBC, IRON, RETICCTPCT in the last 72 hours. Urine analysis: No results found for: COLORURINE, APPEARANCEUR, LABSPEC, [place], GLUCOSEU, [place], BILIRUBINUR, KETONESUR, PROTEINUR, UROBILINOGEN, NITRITE, LEUKOCYTESUR  Radiological Exams on Admission: Ct Chest Wo Contrast  Result Date: 08/08/2018 CLINICAL DATA:  Evaluate lung nodule EXAM: CT CHEST WITHOUT CONTRAST TECHNIQUE: Multidetector CT imaging of the chest was performed following the standard protocol without IV contrast. COMPARISON:  None available FINDINGS: Cardiovascular: No significant vascular  findings. Normal heart size. No pericardial effusion. Mediastinum/Nodes: Noncalcified normal size mediastinal lymph nodes Lungs/Pleura: 6 mm nodule in the RIGHT upper lobe (image 61/5) Upper Abdomen: Limited view of the liver, kidneys, pancreas are unremarkable. Normal adrenal glands. Musculoskeletal: Extensive lytic skeletal metastasis throughout the thoracic spine. Several rib tubal bodies are nearly completely replaced by lytic process (for example T1). Example lesion at T8 extends through the pedicle measures 2.3 cm. Multiple lytic lesions within the ribs and several pathologic fractures. Lytic lesion within the manubrium additionally. IMPRESSION: 1. Indeterminate RIGHT upper lobe pulmonary nodule. In patient with metastatic breast cancer, recommend short-term CT follow-up (3 months). 2. Calcified mediastinal lymph nodes could represent breast cancer metastasis, potentially treatment effect. 3. Extensive expansile metastatic lytic lesions throughout the spine and ribs. Electronically Signed   By: Suzy Bouchard M.D.   On: 08/08/2018 08:08   Ct Chest W Contrast  Result Date: 08/08/2018 CLINICAL DATA:  Metastatic breast cancer. EXAM: CT CHEST, ABDOMEN, AND PELVIS WITH CONTRAST TECHNIQUE: Multidetector CT imaging of the chest, abdomen and pelvis was performed following the standard  protocol during bolus administration of intravenous contrast. CONTRAST:  162mL OMNIPAQUE IOHEXOL 300 MG/ML  SOLN COMPARISON:  Chest CT 08/08/2018 and lumbar spine MRI FINDINGS: CT CHEST FINDINGS Cardiovascular: The heart is normal in size. No pericardial effusion. Normal caliber thoracic aorta without dissection or significant atherosclerotic calcifications. The branch vessels are patent. No definite coronary artery calcifications. Mediastinum/Nodes: Mediastinal and bilateral hilar lymphadenopathy as demonstrated on the prior recent chest CT. 8.5 mm pretracheal lymph node on image number 22. 13 mm AP window node on image number  21. 15 mm right hilar node on image number 28. 11 mm left hilar lymph node on image number 23. Lungs/Pleura: Stable 6 mm right upper lobe pulmonary nodule on image number 65 most likely a metastatic focus. A do not however see any other definite metastatic pulmonary nodules. Stable emphysematous changes and areas of pulmonary scarring. Chest wall/musculoskeletal: Skin thickening of the left breast is noted along with subareolar density. Deeper in the left breast there is a 16 mm soft tissue mass with a probable biopsy clip nearby. Extensive left axillary lymphadenopathy. Largest node measures 2.1 x 2.3 cm on image number 13. 10.5 mm left subclavicular node on image 4. 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 Evidence of tumor in the spinal canal on the left at T5, dorsally at T7, on the left side at T8 and ventrally and to the right at L2. There is no significant canal compromise at this time but findings are worrisome. CT ABDOMEN PELVIS FINDINGS Hepatobiliary: No focal hepatic lesions to suggest metastatic disease. Simple appearing cyst noted in segment 3. The portal and hepatic veins are patent. The gallbladder is surgically absent. No common bile duct dilatation. Pancreas: No mass, inflammation or ductal dilatation. Spleen: Normal size.  No gall lesions. Adrenals/Urinary Tract: The adrenal glands and kidneys are unremarkable. Small bilateral lower pole renal calculi. The bladder is normal. Stomach/Bowel: The stomach, duodenum, small bowel and colon are unremarkable. No acute inflammatory changes, mass lesions or obstructive findings. The terminal ileum and appendix are normal. Vascular/Lymphatic: Scattered atherosclerotic calcifications involving the abdominal aorta and iliac arteries, advanced for age. No aneurysm or dissection. The branch vessels are patent. The major venous structures are patent. No mesenteric or retroperitoneal mass or adenopathy.  Reproductive: The uterus and ovaries are normal. Other: No pelvic mass or adenopathy. No free pelvic fluid collections. No inguinal adenopathy. No subcutaneous lesions. 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 Areas of cortical breakthrough and associated soft tissue masses involving the spine and pelvis. IMPRESSION: 1. Skin thickening of the left breast, subareolar masslike density and a smaller mass in the deep aspect of the left breast. Associated bulky metastatic left axillary lymphadenopathy. 2. Mediastinal and hilar lymphadenopathy and a single right upper lobe pulmonary nodule which is likely metastatic. 3. Widespread aggressive/lytic/destructive bony metastatic disease with areas of spinal canal involvement and areas of cortical breakthrough and associated soft tissue mass is most notably in the pelvis. 4. No findings to suggest abdominal/pelvic metastatic disease. Electronically Signed   By: Marijo Sanes M.D.   On: 08/08/2018 17:40   Mr Lumbar Spine Wo Contrast  Result Date: 08/08/2018 CLINICAL DATA:  Low back hip pain. EXAM: MRI LUMBAR SPINE WITHOUT CONTRAST TECHNIQUE: Multiplanar, multisequence MR imaging of the lumbar spine was performed. No intravenous contrast was administered. COMPARISON:  Overlapping portions from CT chest dated 08/08/2018 FINDINGS: Segmentation: The lowest lumbar type non-rib-bearing vertebra is labeled as L5. Alignment:  No vertebral subluxation is observed. Vertebrae: Extensive tumor infiltration of the visualized osseous structures. There are bulging margins of a variety of cortical surfaces including posteriorly and anteriorly along the sacrum, posteriorly along the L2 vertebral body eccentric to the right, and posteriorly along the L3 vertebral body as well as in both iliac bones, suspicious for malignancy. Schmorl's nodes or small central endplate fractures along the superior and  inferior endplates of L3 raise the possibility that the posterior bulging at L3 could be from mild posterior bony retropulsion. Heterogeneous lesion throughout the posterior elements of the lumbar spine. Incidental 2.2 cm hemangioma the L2 vertebral body. Congenitally short pedicles. Conus medullaris and cauda equina: Conus extends to the L1 level. Conus and cauda equina appear normal. Paraspinal and other soft tissues: Unremarkable Disc levels: L1-2: Unremarkable. L2-3: Mild right subarticular lateral recess stenosis due to posterior tumor extension along the vertebral margin, epidural extension of tumor is a distinct possibility. Borderline central narrowing of the thecal sac. L3-4: Posterior bony convexity at L3 potentially from epidural tumor or cortical bulging from tumor. Prominent central narrowing of the thecal sac primarily from short pedicles. L4-5: Prominent central narrowing of the thecal sac along with mild bilateral foraminal stenosis primarily from short pedicles, disc bulge, and facet arthropathy. L5-S1: Borderline right foraminal stenosis due to facet and intervertebral spurring. Suspected prior right laminectomy. IMPRESSION: 1. Nearly diffuse malignant involvement of the visualized bony structures. Posterior bony convexity at multiple levels especially L2 and L3 and also in the sacrum possibly from epidural extension of tumor as well as vertebral expansion from tumor. Given the left axillary adenopathy in left posterior breast mass on prior CT chest, this is presumably from metastatic breast cancer, correlate with patient history. 2. Lumbar spondylosis, degenerative disc disease, and congenitally short pedicles contribute to prominent central narrowing of the thecal sac L3-4 and L4-5 allow with mild foraminal impingement at L2-3 and L4-5, as detailed above. Electronically Signed   By: Van Clines M.D.   On: 08/08/2018 09:53   Ct Abdomen Pelvis W Contrast  Result Date:  08/08/2018 CLINICAL DATA:  Metastatic breast cancer. EXAM: CT CHEST, ABDOMEN, AND PELVIS WITH CONTRAST TECHNIQUE: Multidetector CT imaging of the chest, abdomen and pelvis was performed following the standard protocol during bolus administration of intravenous contrast. CONTRAST:  131mL OMNIPAQUE IOHEXOL 300 MG/ML  SOLN COMPARISON:  Chest CT 08/08/2018 and lumbar spine MRI FINDINGS: CT CHEST FINDINGS Cardiovascular: The heart is normal in size. No pericardial effusion. Normal caliber thoracic aorta without dissection or significant atherosclerotic calcifications. The branch vessels are patent. No definite coronary artery calcifications. Mediastinum/Nodes: Mediastinal and bilateral hilar lymphadenopathy as demonstrated on the prior recent chest CT. 8.5 mm pretracheal lymph node on image number 22. 13 mm AP window node on image number 21. 15 mm right hilar node on image number 28. 11 mm left hilar lymph node on image number 23. Lungs/Pleura: Stable 6 mm right upper lobe pulmonary nodule on image number 65 most likely a metastatic focus. A do not however see any other definite metastatic pulmonary nodules. Stable emphysematous changes and areas of pulmonary scarring. Chest wall/musculoskeletal: Skin thickening of the left breast is noted along with subareolar density. Deeper in the left breast there is a 16 mm soft tissue mass with a probable biopsy clip nearby. Extensive left axillary lymphadenopathy. Largest node measures 2.1 x 2.3 cm on image number 13. 10.5 mm left subclavicular node on image 4. 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 Evidence  of tumor in the spinal canal on the left at T5, dorsally at T7, on the left side at T8 and ventrally and to the right at L2. There is no significant canal compromise at this time but findings are worrisome. CT ABDOMEN PELVIS FINDINGS Hepatobiliary: No focal hepatic lesions to suggest metastatic disease. Simple appearing cyst  noted in segment 3. The portal and hepatic veins are patent. The gallbladder is surgically absent. No common bile duct dilatation. Pancreas: No mass, inflammation or ductal dilatation. Spleen: Normal size.  No gall lesions. Adrenals/Urinary Tract: The adrenal glands and kidneys are unremarkable. Small bilateral lower pole renal calculi. The bladder is normal. Stomach/Bowel: The stomach, duodenum, small bowel and colon are unremarkable. No acute inflammatory changes, mass lesions or obstructive findings. The terminal ileum and appendix are normal. Vascular/Lymphatic: Scattered atherosclerotic calcifications involving the abdominal aorta and iliac arteries, advanced for age. No aneurysm or dissection. The branch vessels are patent. The major venous structures are patent. No mesenteric or retroperitoneal mass or adenopathy. Reproductive: The uterus and ovaries are normal. Other: No pelvic mass or adenopathy. No free pelvic fluid collections. No inguinal adenopathy. No subcutaneous lesions. 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 Areas of cortical breakthrough and associated soft tissue masses involving the spine and pelvis. IMPRESSION: 1. Skin thickening of the left breast, subareolar masslike density and a smaller mass in the deep aspect of the left breast. Associated bulky metastatic left axillary lymphadenopathy. 2. Mediastinal and hilar lymphadenopathy and a single right upper lobe pulmonary nodule which is likely metastatic. 3. Widespread aggressive/lytic/destructive bony metastatic disease with areas of spinal canal involvement and areas of cortical breakthrough and associated soft tissue mass is most notably in the pelvis. 4. No findings to suggest abdominal/pelvic metastatic disease. Electronically Signed   By: Marijo Sanes M.D.   On: 08/08/2018 17:40    EKG: Independently reviewed.    Assessment/Plan Principal  Problem:   Breast cancer metastasized to bone [facility]) Observation/telemetry. Continue dexamethasone 8 mg p.o. every 8 hours.. Continue Toradol 30 mg IVP every 6 hours as needed. Continue hydromorphone 1 mg IVP every 4 hours as needed for pain. Oncology consult appreciated.  Active Problems:   Back pain Secondary to metastatic disease. As above. Continue analgesics as needed.    Paroxysmal SVT (supraventricular tachycardia) (HCC) Keep electrolytes optimized. Consider low-dose beta-blocker if she becomes tachycardic.    Abnormal LFTs CT abdomen/pelvis to be done tomorrow. Follow-up hepatic functions in the morning.   DVT prophylaxis: SCDs. Code Status: Full code. Family Communication: Disposition Plan: Observation for pain control and further work-up. Consults called: Oncology (Dr. Burr Medico). Admission status: Observation/MedSurg.   Reubin Milan MD [facility]   If 7PM-7AM, please contact night-coverage [email or web address] Password [facility]  08/08/2018, 6:33 PM

## 2018-08-08 NOTE — ED Triage Notes (Signed)
Pt arriving via [facility] from home for lower back and hip pain Pt has hx of ruptured disc and sciatica. Pt reports the pain woke her up last night. Pt reports numbness in the left arm that has been present x2 weeks. Denies chest pain, N/V.

## 2018-08-08 NOTE — ED Notes (Signed)
Patient transported to CT 

## 2018-08-08 NOTE — Consult Note (Signed)
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  Her MRI of the lumbar spine showed a diffuse bone metastasis.  I was called to evaluate her.   Patient noticed palpable mass in the left breast in August 2019, and went in for mammogram at [facility] on April 01, 2018, which showed cluster of 3 solid nodules at the lateral and slightly superior aspect of the left breast, largest 1.9 cm, with additional enlarged abnormal lymph nodes at the left axilla.  Biopsy was recommended but the patient did not follow.  Around same time, she also noticed intermittent low back pain, per pt, she has been busy taking care of her elderly father-in-law, and her husband who is alcoholic.  She has not seen any doctor for the back pain.  She has been taking over-the-counter pain medication.  In the past 2 weeks, her back pain has been getting much worse, almost unbearable, with radiation to bilateral hip area, she also developed numbness on the lateral of left arm and shoulder.  She is only able to walk with a walker for short distance.  She presented to [facility] emergency room today for further management.  She was tearful when I interviewed her, has been under a lot of stress stress during her family situation, she has 3 children, only her son lives close to her and has been able to help her some.   Patient lives with her husband, father-in-law.  She has heavy smoking history with 1 pack daily for 40 years, she has cut  down to half pack a day lately.  She drinks alcohol occasionally.  She denies any illicit drug abuse.  MEDICAL HISTORY:  Past Medical History:  Diagnosis Date  . Breast cancer ([place]) 2019  . SVT (supraventricular tachycardia) ([place])     SURGICAL HISTORY: Past Surgical History:  Procedure Laterality Date  . BACK SURGERY    . CARPAL TUNNEL RELEASE Bilateral   . CHOLECYSTECTOMY    . TUBAL LIGATION      SOCIAL HISTORY: Social History   Socioeconomic History  . Marital status: Married    Spouse name: Not on file  . Number of children: Not on file  . Years of education: Not on file  . Highest education level: Not on file  Occupational History  . Not on file  Social Needs  . Financial resource strain: Not on file  . Food insecurity:    Worry: Not on file    Inability: Not on file  . Transportation needs:    Medical: Not on file    Non-medical: Not on file  Tobacco Use  . Smoking status: Current Every Day Smoker  Substance and Sexual Activity  . Alcohol use: No  . Drug use: No  . Sexual activity: Not on file  Lifestyle  . Physical activity:    Days per week: Not on file    Minutes per session: Not on file  . Stress: Not on file  Relationships  . Social connections:    Talks on phone: Not  on file    Gets together: Not on file    Attends religious service: Not on file    Active member of club or organization: Not on file    Attends meetings of clubs or organizations: Not on file    Relationship status: Not on file  . Intimate partner violence:    Fear of current or ex partner: Not on file    Emotionally abused: Not on file    Physically abused: Not on file    Forced sexual activity: Not on file  Other Topics Concern  . Not on file  Social History Narrative  . Not on file    FAMILY HISTORY: History reviewed. No pertinent family history.  ALLERGIES:  is allergic to oxycodone-acetaminophen and other.  MEDICATIONS:  Current Facility-Administered Medications    Medication Dose Route Frequency Provider Last Rate Last Dose  . 0.9 % NaCl with KCl 20 mEq/ L  infusion   Intravenous Continuous Reubin Milan, MD 88 mL/hr at 08/08/18 1517    . famotidine (PEPCID) tablet 20 mg  20 mg Oral BID Reubin Milan, MD   20 mg at 08/08/18 1417  . heparin injection 5,000 Units  5,000 Units Subcutaneous Q8H Reubin Milan, MD   5,000 Units at 08/08/18 1417  . HYDROmorphone (DILAUDID) injection 1 mg  1 mg Intravenous Q4H PRN Reubin Milan, MD      . ketorolac (TORADOL) 30 MG/ML injection 30 mg  30 mg Intravenous Q6H PRN Reubin Milan, MD      . ondansetron [facility]) tablet 4 mg  4 mg Oral Q6H PRN Reubin Milan, MD       Or  . ondansetron [facility]) injection 4 mg  4 mg Intravenous Q6H PRN Reubin Milan, MD        REVIEW OF SYSTEMS:   Constitutional: Denies fevers, chills or abnormal night sweats Eyes: Denies blurriness of vision, double vision or watery eyes Ears, nose, mouth, throat, and face: Denies mucositis or sore throat Respiratory: Denies cough, dyspnea or wheezes Cardiovascular: Denies palpitation, chest discomfort or lower extremity swelling Gastrointestinal:  Denies nausea, heartburn or change in bowel habits Skin: Denies abnormal skin rashes Lymphatics: Denies new lymphadenopathy or easy bruising Neurological:Denies numbness, tingling or new weaknesses Behavioral/Psych: Mood is stable, no new changes  All other systems were reviewed with the patient and are negative.  PHYSICAL EXAMINATION: ECOG PERFORMANCE STATUS: 4 - Bedbound  Vitals:   08/08/18 1304 08/08/18 1404  BP: 110/70 117/66  Pulse: 65 70  Resp:  16  Temp: 98.3 F (36.8 C) 98 F (36.7 C)  SpO2: 97% 98%   Filed Weights   08/08/18 0613 08/08/18 1404  Weight: 120 lb (54.4 kg) 120 lb (54.4 kg)    GENERAL:alert, mild distress due to her back pain SKIN: skin color, texture, turgor are normal, no rashes or significant lesions EYES: normal,  conjunctiva are pink and non-injected, sclera clear OROPHARYNX:no exudate, no erythema and lips, buccal mucosa, and tongue normal  NECK: supple, thyroid normal size, non-tender, without nodularity LYMPH: There is a 1.5 x 2.5 cm firm lymph nodes in the left supraclavicular area, and a 2 cm movable lymph node in the left axilla. LUNGS: clear to auscultation and percussion with normal breathing effort HEART: regular rate & rhythm and no murmurs and no lower extremity edema ABDOMEN:abdomen soft, non-tender and normal bowel sounds Musculoskeletal:no cyanosis of digits and no clubbing, diffuse mild tenderness in the lumbar spine. PSYCH: alert & oriented  x 3 with fluent speech NEURO: Oriented with fluent speech.  She moves all extremity, but could not lift either lower extremity above the bed, mainly due to her hip and back pain  Breasts: Breast inspection showed them to be symmetrical with no nipple discharge. Palpation of the breasts and axilla revealed a 1X3cm mass at 2:00 position of left breast, and a 2cm movable lymph node in the left axilla.     LABORATORY DATA:  I have reviewed the data as listed Lab Results  Component Value Date   WBC 8.4 08/08/2018   HGB 14.1 08/08/2018   HCT 43.5 08/08/2018   MCV 89.0 08/08/2018   PLT 213 08/08/2018   Recent Labs    08/08/18 0647  NA 140  K 3.5  CL 108  CO2 20*  GLUCOSE 105*  BUN 18  CREATININE 0.63  CALCIUM 10.1  GFRNONAA >60  GFRAA >60  PROT 6.8  ALBUMIN 4.1  AST 54*  ALT 50*  ALKPHOS 134*  BILITOT 0.5    RADIOGRAPHIC STUDIES: I have personally reviewed the radiological images as listed and agreed with the findings in the report. Ct Chest Wo Contrast  Result Date: 08/08/2018 CLINICAL DATA:  Evaluate lung nodule EXAM: CT CHEST WITHOUT CONTRAST TECHNIQUE: Multidetector CT imaging of the chest was performed following the standard protocol without IV contrast. COMPARISON:  None available FINDINGS: Cardiovascular: No significant  vascular findings. Normal heart size. No pericardial effusion. Mediastinum/Nodes: Noncalcified normal size mediastinal lymph nodes Lungs/Pleura: 6 mm nodule in the RIGHT upper lobe (image 61/5) Upper Abdomen: Limited view of the liver, kidneys, pancreas are unremarkable. Normal adrenal glands. Musculoskeletal: Extensive lytic skeletal metastasis throughout the thoracic spine. Several rib tubal bodies are nearly completely replaced by lytic process (for example T1). Example lesion at T8 extends through the pedicle measures 2.3 cm. Multiple lytic lesions within the ribs and several pathologic fractures. Lytic lesion within the manubrium additionally. IMPRESSION: 1. Indeterminate RIGHT upper lobe pulmonary nodule. In patient with metastatic breast cancer, recommend short-term CT follow-up (3 months). 2. Calcified mediastinal lymph nodes could represent breast cancer metastasis, potentially treatment effect. 3. Extensive expansile metastatic lytic lesions throughout the spine and ribs. Electronically Signed   By: Suzy Bouchard M.D.   On: 08/08/2018 08:08   Mr Lumbar Spine Wo Contrast  Result Date: 08/08/2018 CLINICAL DATA:  Low back hip pain. EXAM: MRI LUMBAR SPINE WITHOUT CONTRAST TECHNIQUE: Multiplanar, multisequence MR imaging of the lumbar spine was performed. No intravenous contrast was administered. COMPARISON:  Overlapping portions from CT chest dated 08/08/2018 FINDINGS: Segmentation: The lowest lumbar type non-rib-bearing vertebra is labeled as L5. Alignment:  No vertebral subluxation is observed. Vertebrae: Extensive tumor infiltration of the visualized osseous structures. There are bulging margins of a variety of cortical surfaces including posteriorly and anteriorly along the sacrum, posteriorly along the L2 vertebral body eccentric to the right, and posteriorly along the L3 vertebral body as well as in both iliac bones, suspicious for malignancy. Schmorl's nodes or small central endplate fractures  along the superior and inferior endplates of L3 raise the possibility that the posterior bulging at L3 could be from mild posterior bony retropulsion. Heterogeneous lesion throughout the posterior elements of the lumbar spine. Incidental 2.2 cm hemangioma the L2 vertebral body. Congenitally short pedicles. Conus medullaris and cauda equina: Conus extends to the L1 level. Conus and cauda equina appear normal. Paraspinal and other soft tissues: Unremarkable Disc levels: L1-2: Unremarkable. L2-3: Mild right subarticular lateral recess stenosis due to posterior tumor  extension along the vertebral margin, epidural extension of tumor is a distinct possibility. Borderline central narrowing of the thecal sac. L3-4: Posterior bony convexity at L3 potentially from epidural tumor or cortical bulging from tumor. Prominent central narrowing of the thecal sac primarily from short pedicles. L4-5: Prominent central narrowing of the thecal sac along with mild bilateral foraminal stenosis primarily from short pedicles, disc bulge, and facet arthropathy. L5-S1: Borderline right foraminal stenosis due to facet and intervertebral spurring. Suspected prior right laminectomy. IMPRESSION: 1. Nearly diffuse malignant involvement of the visualized bony structures. Posterior bony convexity at multiple levels especially L2 and L3 and also in the sacrum possibly from epidural extension of tumor as well as vertebral expansion from tumor. Given the left axillary adenopathy in left posterior breast mass on prior CT chest, this is presumably from metastatic breast cancer, correlate with patient history. 2. Lumbar spondylosis, degenerative disc disease, and congenitally short pedicles contribute to prominent central narrowing of the thecal sac L3-4 and L4-5 allow with mild foraminal impingement at L2-3 and L4-5, as detailed above. Electronically Signed   By: Van Clines M.D.   On: 08/08/2018 09:53    ASSESSMENT & PLAN: 61 year old  postmenopausal Caucasian female, with past medical history of SVT, palpable left breast mass 4 months ago, presented with severe low back and bilateral hip pain  1. 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 Left breast mass and axillary, [place] adenopathy, likely malignant  3.  Severe back pain and low extremity weakness, secondary to #1 4. History of SVT    Recommendations: -please obtain CT abdomen and pelvis with contrast to complete a staging -Please obtain MRI of cervical and thoracic spine  -I have spoke with IR about biopsy, they will likely biopsy the left [place] node, hopefully tomorrow  -pending neurosurgery evaluation, called by ED physician  -I have informed rad/onc Dr. Lisbeth Renshaw and his PA Bryson Ha, they will see her and evaluate her candidacy for palliative RT  -she received dexa 10mg  in ED this morning, I will order dexa 8mg  q8h  -palliative consult for her pain control  -I will f/u    All questions were answered. The patient knows to call the clinic with any problems, questions or concerns.      Truitt Merle, MD 08/08/2018 3:43 PM

## 2018-08-08 NOTE — ED Notes (Signed)
ED Provider at bedside. 

## 2018-08-08 NOTE — ED Provider Notes (Addendum)
89 aM seen by me.  Complains of low back pain rating to right leg to just below the calf onset approximately a month ago.  She complains of numbness in left arm at ulnar aspect of the little finger for the past 2 weeks.  She has no trouble moving her arm or hand.  She denies fever denies loss of bladder or bowel control.  She is been walking with a walker since October 2019.  Her pain is improved since treatment here.  On exam she is alert nontoxic appearing lungs clear to auscultation abdomen nontender.  Back without point tenderness neurologic moves all extremities well motor strength 5/5 overall.  Cranial nerves II through XII grossly intact   Orlie Dakin, MD 08/08/18 0804   11:35 AM patient requesting more pain medicine.  Additional intravenous hydromorphone ordered.  I consulted Dr.Feng from oncology service who will see patient in hospital.  She requested Decadron 10 mg IV.  Ordered by me.  I also spoke with Dr. Saintclair Halsted by telephone who reviewed patient's MRI scan and deemed that she does not require neurosurgical consultation as her spinal lesions are not surgical.  Hospitalist service consulted Dr Olevia Bowens for admission   Orlie Dakin, MD 08/08/18 1217 Palliative care consult ordered to discuss goals of care.   Orlie Dakin, MD 08/08/18 1218

## 2018-08-09 ENCOUNTER — Observation Stay (HOSPITAL_COMMUNITY): Payer: Self-pay

## 2018-08-09 ENCOUNTER — Ambulatory Visit
Admit: 2018-08-09 | Discharge: 2018-08-09 | Disposition: A | Discharge status: Home / Self Care | Payer: Self-pay | Attending: Radiation Oncology | Admitting: Radiation Oncology

## 2018-08-09 ENCOUNTER — Encounter (HOSPITAL_COMMUNITY): Payer: Self-pay

## 2018-08-09 DIAGNOSIS — C7951 Secondary malignant neoplasm of bone: Secondary | ICD-10-CM

## 2018-08-09 DIAGNOSIS — Z7189 Other specified counseling: Secondary | ICD-10-CM

## 2018-08-09 DIAGNOSIS — Z515 Encounter for palliative care: Secondary | ICD-10-CM

## 2018-08-09 LAB — CBC
HCT: 41.2 % (ref 36.0–46.0)
Hemoglobin: 13 g/dL (ref 12.0–15.0)
MCH: 28.4 pg (ref 26.0–34.0)
MCHC: 31.6 g/dL (ref 30.0–36.0)
MCV: 90 fL (ref 80.0–100.0)
NRBC: 0 % (ref 0.0–0.2)
Platelets: 233 10*3/uL (ref 150–400)
RBC: 4.58 MIL/uL (ref 3.87–5.11)
RDW: 15.8 % — ABNORMAL HIGH (ref 11.5–15.5)
WBC: 6.8 10*3/uL (ref 4.0–10.5)

## 2018-08-09 LAB — COMPREHENSIVE METABOLIC PANEL
ALBUMIN: 3.4 g/dL — AB (ref 3.5–5.0)
ALT: 51 U/L — ABNORMAL HIGH (ref 0–44)
AST: 54 U/L — ABNORMAL HIGH (ref 15–41)
Alkaline Phosphatase: 123 U/L (ref 38–126)
Anion gap: 11 (ref 5–15)
BUN: 20 mg/dL (ref 8–23)
CO2: 21 mmol/L — ABNORMAL LOW (ref 22–32)
Calcium: 9.4 mg/dL (ref 8.9–10.3)
Chloride: 108 mmol/L (ref 98–111)
Creatinine, Ser: 0.6 mg/dL (ref 0.44–1.00)
GFR calc Af Amer: >60 mL/min (ref >60)
GFR calc non Af Amer: >60 mL/min (ref >60)
Glucose, Bld: 166 mg/dL — ABNORMAL HIGH (ref 70–99)
POTASSIUM: 4.4 mmol/L (ref 3.5–5.1)
Sodium: 140 mmol/L (ref 135–145)
Total Bilirubin: 0.5 mg/dL (ref 0.3–1.2)
Total Protein: 6.3 g/dL — ABNORMAL LOW (ref 6.5–8.1)

## 2018-08-09 MED ORDER — HYDROCODONE-ACETAMINOPHEN 5-325 MG PO TABS: 1.0000 | ORAL_TABLET | ORAL | Status: DC | PRN

## 2018-08-09 MED ORDER — SODIUM CHLORIDE 0.9 % IV SOLN
INTRAVENOUS | Status: DC
  Administered 2018-08-09 – 2018-08-10 (×3): via INTRAVENOUS
  Administered 2018-08-11: 1000 mL via INTRAVENOUS
  Administered 2018-08-11 – 2018-08-13 (×3): via INTRAVENOUS

## 2018-08-09 MED ORDER — METHOCARBAMOL 500 MG PO TABS
500.0000 mg | ORAL_TABLET | Freq: Three times a day (TID) | ORAL | Status: DC
  Administered 2018-08-09 – 2018-08-18 (×28): 500 mg via ORAL
  Filled 2018-08-09 (×28): qty 1

## 2018-08-09 MED ORDER — KETOROLAC TROMETHAMINE 30 MG/ML IJ SOLN
30.0000 mg | Freq: Four times a day (QID) | INTRAMUSCULAR | Status: AC
  Administered 2018-08-09 – 2018-08-14 (×19): 30 mg via INTRAVENOUS
  Filled 2018-08-09 (×19): qty 1

## 2018-08-09 MED ORDER — NALOXONE HCL 0.4 MG/ML IJ SOLN
INTRAMUSCULAR | Status: AC
  Filled 2018-08-09: qty 1

## 2018-08-09 MED ORDER — POLYETHYLENE GLYCOL 3350 17 G PO PACK
17.0000 g | PACK | Freq: Every day | ORAL | Status: DC
  Administered 2018-08-09 – 2018-08-11 (×3): 17 g via ORAL
  Filled 2018-08-09 (×3): qty 1

## 2018-08-09 MED ORDER — LIDOCAINE HCL (PF) 1 % IJ SOLN
INTRAMUSCULAR | Status: AC | PRN
  Administered 2018-08-09: 10 mL via SUBCUTANEOUS

## 2018-08-09 MED ORDER — ACETAMINOPHEN 325 MG PO TABS
650.0000 mg | ORAL_TABLET | Freq: Four times a day (QID) | ORAL | Status: DC | PRN
  Administered 2018-08-12: 650 mg via ORAL
  Filled 2018-08-09: qty 2

## 2018-08-09 MED ORDER — MIDAZOLAM HCL 2 MG/2ML IJ SOLN
INTRAMUSCULAR | Status: AC
  Filled 2018-08-09: qty 4

## 2018-08-09 MED ORDER — HYDROMORPHONE HCL 1 MG/ML IJ SOLN
0.5000 mg | INTRAMUSCULAR | Status: DC | PRN
  Administered 2018-08-09 – 2018-08-15 (×13): 1 mg via INTRAVENOUS
  Filled 2018-08-09 (×13): qty 1

## 2018-08-09 MED ORDER — FLUMAZENIL 0.5 MG/5ML IV SOLN
INTRAVENOUS | Status: AC
  Filled 2018-08-09: qty 5

## 2018-08-09 MED ORDER — FENTANYL CITRATE (PF) 100 MCG/2ML IJ SOLN
INTRAMUSCULAR | Status: AC
  Filled 2018-08-09: qty 4

## 2018-08-09 MED ORDER — MIDAZOLAM HCL 2 MG/2ML IJ SOLN
INTRAMUSCULAR | Status: AC | PRN
  Administered 2018-08-09 (×3): 1 mg via INTRAVENOUS

## 2018-08-09 MED ORDER — FENTANYL CITRATE (PF) 100 MCG/2ML IJ SOLN
INTRAMUSCULAR | Status: AC | PRN
  Administered 2018-08-09 (×2): 50 ug via INTRAVENOUS

## 2018-08-09 MED ORDER — OXYCODONE HCL 5 MG PO TABS
5.0000 mg | ORAL_TABLET | ORAL | Status: DC | PRN
  Administered 2018-08-12 (×3): 5 mg via ORAL
  Filled 2018-08-09 (×4): qty 1

## 2018-08-09 MED ORDER — SENNOSIDES-DOCUSATE SODIUM 8.6-50 MG PO TABS
1.0000 | ORAL_TABLET | Freq: Two times a day (BID) | ORAL | Status: DC
  Administered 2018-08-09 – 2018-08-18 (×15): 1 via ORAL
  Filled 2018-08-09 (×16): qty 1

## 2018-08-09 NOTE — Progress Notes (Signed)
[facility] Progress Note  Patient: Kristina Mcfarland PXT:[id number]   PCP: Willeen Niece, PA DOB: [birth year]   DOA: 08/08/2018   DOS: 08/09/2018   Date of Service: the patient was seen and examined on 08/09/2018  Brief hospital course: Pt. with PMH of recently diagnosed breast cancer, chronic back pain; admitted on 08/08/2018, presented with complaint of worsening back pain, was found to have extensive metastatic disease. Currently further plan is continue pain control and further work-up.  Subjective: Reports multiple trips are causing severe pain.  No nausea no vomiting.  Also reports constipation.  No abdominal pain.  Breathing is okay.  Assessment and Plan: 1.  Stage IV metastatic breast cancer. Extensive metastatic disease based on the CT and MRI. Oncology consulted. Recommended radiology consultation.  Patient underwent CT simulation. IR was consulted for biopsy of the lesions as well.  2.  Cancer-related pain. Reports severe pain in his back. Will adjust pain medication regimen and monitor. Palliative care is also consulted for pain management. Appreciate their assistance.  3.  History of SVT. Monitor for now.  4.  Abnormal LFT. Monitor for now. Secondary to static disease.   Diet: regular diet DVT Prophylaxis: subcutaneous Heparin  Advance goals of care discussion: full code  Family Communication: no family was present at bedside, at the time of interview.   Disposition:  Discharge to be determined.  Consultants: Oncology, palliative care, interventional radiology, radiation oncology Procedures: IR guided biopsy  Scheduled Meds: . dexamethasone  8 mg Oral Q8H  . famotidine  20 mg Oral BID  . feeding supplement (ENSURE ENLIVE)  237 mL Oral BID BM  . ketorolac  30 mg Intravenous Q6H  . methocarbamol  500 mg Oral TID  . polyethylene glycol  17 g Oral Daily  . senna-docusate  1 tablet Oral BID   Continuous Infusions: . sodium chloride 75 mL/hr at  08/09/18 1724   PRN Meds: acetaminophen, ALPRAZolam, HYDROmorphone (DILAUDID) injection, ondansetron **OR** ondansetron (ZOFRAN) IV, oxyCODONE Antibiotics: Anti-infectives (From admission, onward)   None       Objective: Physical Exam: Vitals:   08/09/18 1340 08/09/18 1345 08/09/18 1350 08/09/18 1424  BP: 122/72 119/63 111/64 122/81  Pulse: 72 68 76 70  Resp: (!) 9 10 10 18   Temp:    97.8 F (36.6 C)  TempSrc:    Oral  SpO2: 98% 98% 98% 97%  Weight:      Height:        Intake/Output Summary (Last 24 hours) at 08/09/2018 1805 Last data filed at 08/09/2018 1724 Gross per 24 hour  Intake 1565.78 ml  Output 2 ml  Net 1563.78 ml   Filed Weights   08/08/18 0613 08/08/18 1404  Weight: 54.4 kg 54.4 kg   General: Alert, Awake and Oriented to Time, Place and Person. Appear in marked distress, affect appropriate Eyes: PERRL, Conjunctiva normal ENT: Oral Mucosa clear moist. Neck: no JVD, no Abnormal Mass Or lumps Cardiovascular: S1 and S2 Present, no Murmur, Peripheral Pulses Present Respiratory: normal respiratory effort, Bilateral Air entry equal and Decreased, no use of accessory muscle, Clear to Auscultation, no Crackles, no wheezes Abdomen: Bowel Sound present, Soft and no tenderness, no hernia Skin: no redness, no Rash, no induration Extremities: no Pedal edema, no calf tenderness Neurologic: Grossly no focal neuro deficit. Bilaterally Equal motor strength  Data Reviewed: CBC: Recent Labs  Lab 08/08/18 0647 08/09/18 0355  WBC 8.4 6.8  NEUTROABS 5.2  --   HGB 14.1 13.0  HCT 43.5 41.2  MCV 89.0 90.0  PLT 213 388   Basic Metabolic Panel: Recent Labs  Lab 08/08/18 0647 08/08/18 0700 08/29/2018 0355  NA 140  --  140  K 3.5  --  4.4  CL 108  --  108  CO2 20*  --  21*  GLUCOSE 105*  --  166*  BUN 18  --  20  CREATININE 0.63  --  0.60  CALCIUM 10.1  --  9.4  MG  --  2.3  --   PHOS  --  4.9*  --     Liver Function Tests: Recent Labs  Lab 08/08/18 0647  2018/08/29 0355  AST 54* 54*  ALT 50* 51*  ALKPHOS 134* 123  BILITOT 0.5 0.5  PROT 6.8 6.3*  ALBUMIN 4.1 3.4*   No results for input(s): LIPASE, AMYLASE in the last 168 hours. No results for input(s): AMMONIA in the last 168 hours. Coagulation Profile: Recent Labs  Lab 08/08/18 1632  INR 1.06   Cardiac Enzymes: No results for input(s): CKTOTAL, CKMB, CKMBINDEX, TROPONINI in the last 168 hours. BNP (last 3 results) No results for input(s): PROBNP in the last 8760 hours. CBG: No results for input(s): GLUCAP in the last 168 hours. Studies: Ct Biopsy  Result Date: 08-29-18 INDICATION: Multiple lytic bone lesions EXAM: CT BIOPSY MEDICATIONS: None. ANESTHESIA/SEDATION: Fentanyl 100 mcg IV; Versed 3 mg IV Moderate Sedation Time:  14 minutes The patient was continuously monitored during the procedure by the interventional radiology nurse under my direct supervision. FLUOROSCOPY TIME:  Fluoroscopy Time:  minutes  seconds ( mGy). COMPLICATIONS: None immediate. PROCEDURE: Informed written consent was obtained from the patient after a thorough discussion of the procedural risks, benefits and alternatives. All questions were addressed. Maximal Sterile Barrier Technique was utilized including caps, mask, sterile gowns, sterile gloves, sterile drape, hand hygiene and skin antiseptic. A timeout was performed prior to the initiation of the procedure. Under CT guidance, a(n) 17 gauge guide needle was advanced into the left sacral bone lesion. Subsequently, 3 18 gauge core biopsies were obtained. The guide needle was removed. Post biopsy images demonstrate no hemorrhage. Patient tolerated the procedure well without complication. Vital sign monitoring by nursing staff during the procedure will continue as patient is in the special procedures unit for post procedure observation. FINDINGS: The images document guide needle placement within the left sacral bone lesion. Post biopsy images demonstrate no  hemorrhage. IMPRESSION: Successful CT-guided core biopsy of a left sacral bone lesion. Electronically Signed   By: Marybelle Killings M.D.   On: 08-29-2018 15:47     Time spent: 35 minutes  Author: Berle Mull, MD [facility] Pager: [phone] 08/29/18 6:05 PM  Between 7PM-7AM, please contact night-coverage at [email or web address], password [facility]

## 2018-08-09 NOTE — Consult Note (Signed)
Consultation Note Date: 08/09/2018   Patient Name: Kristina Mcfarland  DOB: [birth year]  MRN: [id number]  Age / Sex: 62 y.o., female  PCP: Willeen Niece, [place] Referring Physician: Lavina Hamman, MD  Reason for Consultation: Establishing goals of care  HPI/Patient Profile: 62 y.o. female  admitted on 08/08/2018   Clinical Assessment and Goals of Care:  Pt. with PMH of recently diagnosed breast cancer, chronic back pain; admitted on 08/08/2018, presented with complaint of worsening back pain, was found to have extensive metastatic disease.  Patient complains of pain. I introduced myself and palliative care as follows: Palliative medicine is specialized medical care for people living with serious illness. It focuses on providing relief from the symptoms and stress of a serious illness. The goal is to improve quality of life for both the patient and the family.  See below  NEXT OF KIN  patient elects for her daughter Docia Barrier to be her designated HCPOA, she has no paper work. The number we have for Leona [phone] is not her number.    SUMMARY OF RECOMMENDATIONS    1. Remains full code, full scope for now. The patient becomes tearful and overwhelmed when discussing about code status, she states,"I now what you are asking, but I'm not ready for this question." 2. Pain regimen noted, will add low dose PO Oxy IR, continue IV Dilaudid PRN. Agree with dexamethasone for bone pain.  3. Spiritual care consult for HCPOA paper work completion, as well as for additional support for the family.  4. Continue current mode of care, monitor hospital course and disease trajectory to help guide further discussions, decision making and appropriate disposition planning.  Thank you for the consult.   Code Status/Advance Care Planning:  Full code    Symptom Management:    as above   Palliative Prophylaxis:   Delirium  Protocol  Additional Recommendations (Limitations, Scope, Preferences):  Full Scope Treatment  Psycho-social/Spiritual:   Desire for further Chaplaincy support:yes  Additional Recommendations: Caregiving  Support/Resources  Prognosis:   Unable to determine  Discharge Planning: To Be Determined      Primary Diagnoses: Present on Admission: . Breast cancer metastasized to bone ([place]) . Paroxysmal SVT (supraventricular tachycardia) ([place]) . Abnormal LFTs . Back pain   I have reviewed the medical record, interviewed the patient and family, and examined the patient. The following aspects are pertinent.  Past Medical History:  Diagnosis Date  . Breast cancer ([place]) 2019  . SVT (supraventricular tachycardia) ([facility])    Social History   Socioeconomic History  . Marital status: Married    Spouse name: Not on file  . Number of children: Not on file  . Years of education: Not on file  . Highest education level: Not on file  Occupational History  . Not on file  Social Needs  . Financial resource strain: Not on file  . Food insecurity:    Worry: Not on file    Inability: Not on file  . Transportation needs:    Medical:  Not on file    Non-medical: Not on file  Tobacco Use  . Smoking status: Current Every Day Smoker  Substance and Sexual Activity  . Alcohol use: No  . Drug use: No  . Sexual activity: Not on file  Lifestyle  . Physical activity:    Days per week: Not on file    Minutes per session: Not on file  . Stress: Not on file  Relationships  . Social connections:    Talks on phone: Not on file    Gets together: Not on file    Attends religious service: Not on file    Active member of club or organization: Not on file    Attends meetings of clubs or organizations: Not on file    Relationship status: Not on file  Other Topics Concern  . Not on file  Social History Narrative  . Not on file   History reviewed. No pertinent family history. Scheduled Meds: .  dexamethasone  8 mg Oral Q8H  . famotidine  20 mg Oral BID  . feeding supplement (ENSURE ENLIVE)  237 mL Oral BID BM  . fentaNYL      . flumazenil      . ketorolac  30 mg Intravenous Q6H  . methocarbamol  500 mg Oral TID  . midazolam      . naloxone      . polyethylene glycol  17 g Oral Daily  . senna-docusate  1 tablet Oral BID   Continuous Infusions: . sodium chloride     PRN Meds:.acetaminophen, ALPRAZolam, HYDROmorphone (DILAUDID) injection, midazolam, ondansetron **OR** ondansetron (ZOFRAN) IV, oxyCODONE Medications Prior to Admission:  Prior to Admission medications   Medication Sig Start Date End Date Taking? Authorizing Provider  Aspirin-Salicylamide-Caffeine (ARTHRITIS STRENGTH BC POWDER PO) Take 1 Package by mouth daily as needed (arthritis pain).   Yes [provider]  MELATONIN PO Take 1 tablet by mouth at bedtime as needed (sleep).   Yes [provider]   Allergies  Allergen Reactions  . Oxycodone-Acetaminophen Nausea And Vomiting  . Other     Medication for SVT, Anderal?   Review of Systems +back pain  Physical Exam Awake alert In mild to moderate distress Tearful Has enlarged lymph nodes in the L axilla area as well as in the left supraclavicular area S 1 S 2  Clear Noted to have mass in L breast.  Abdomen is soft She has pain in lumbo sacral spine  Vital Signs: BP (!) 147/85 (BP Location: Right Arm)   Pulse 75   Temp 97.7 F (36.5 C) (Oral)   Resp (!) 21   Ht 5\' 2"  (1.575 m)   Wt 54.4 kg   SpO2 100%   BMI 21.95 kg/m  Pain Scale: 0-10 POSS *See Group Information*: S-Acceptable,Sleep, easy to arouse Pain Score: Asleep   SpO2: SpO2: 100 % O2 Device:SpO2: 100 % O2 Flow Rate: .O2 Flow Rate (L/min): 2 L/min  IO: Intake/output summary:   Intake/Output Summary (Last 24 hours) at 08/09/2018 1334 Last data filed at 08/09/2018 1228 Gross per 24 hour  Intake 1392.69 ml  Output 2 ml  Net 1390.69 ml    LBM: Last BM Date:  08/07/18 Baseline Weight: Weight: 54.4 kg Most recent weight: Weight: 54.4 kg     Palliative Assessment/Data:   Flowsheet Rows     Most Recent Value  Intake Tab  Referral Department  -- [ED]  Unit at Time of Referral  ER  Palliative Care Primary Diagnosis  Cancer  Date Notified  08/08/18  Palliative Care Type  New Palliative care  Reason for referral  Clarify Goals of Care  Date of Admission  08/08/18  # of days IP prior to Palliative referral  0  Clinical Assessment  Psychosocial & Spiritual Assessment  Palliative Care Outcomes    PPS 40%  Time In:  11 Time Out:  12.10 Time Total:  70 min  Greater than 50%  of this time was spent counseling and coordinating care related to the above assessment and plan.  Signed by: Loistine Chance, MD [phone] Please contact Palliative Medicine Team phone at [phone] for questions and concerns.  For individual provider: See Shea Evans

## 2018-08-09 NOTE — Consult Note (Signed)
Chief Complaint: Patient was seen in consultation today for CT-guided biopsy of sacral mass Chief Complaint  Patient presents with  . Back Pain    Referring Physician(s): Feng,Y  Supervising Physician: Marybelle Killings  Patient Status: [facility] - In-pt  History of Present Illness: Kristina Mcfarland is a 62 y.o. female smoker who was admitted to [facility] yesterday with persistent back and hip pain along with left lateral arm/shoulder numbness and known left breast mass.  Further imaging has now revealed associated left axillary lymphadenopathy, mediastinal/hilar lymphadenopathy with right  upper lobe pulmonary nodule, widespread aggressive/lytic/destructive bony metastatic disease and associated soft tissue mass in pelvis.  She has no known history of cancer.  Request received for image guided biopsy for further evaluation.  Past Medical History:  Diagnosis Date  . Breast cancer ([place]) 2019  . SVT (supraventricular tachycardia) ([facility])     Past Surgical History:  Procedure Laterality Date  . BACK SURGERY    . CARPAL TUNNEL RELEASE Bilateral   . CHOLECYSTECTOMY    . TUBAL LIGATION      Allergies: Oxycodone-acetaminophen and Other  Medications: Prior to Admission medications   Medication Sig Start Date End Date Taking? Authorizing Provider  Aspirin-Salicylamide-Caffeine (ARTHRITIS STRENGTH BC POWDER PO) Take 1 Package by mouth daily as needed (arthritis pain).   Yes [provider]  MELATONIN PO Take 1 tablet by mouth at bedtime as needed (sleep).   Yes [provider]     History reviewed. No pertinent family history.  Social History   Socioeconomic History  . Marital status: Married    Spouse name: Not on file  . Number of children: Not on file  . Years of education: Not on file  . Highest education level: Not on file  Occupational History  . Not on file  Social Needs  . Financial resource strain: Not on file  . Food insecurity:    Worry: Not  on file    Inability: Not on file  . Transportation needs:    Medical: Not on file    Non-medical: Not on file  Tobacco Use  . Smoking status: Current Every Day Smoker  Substance and Sexual Activity  . Alcohol use: No  . Drug use: No  . Sexual activity: Not on file  Lifestyle  . Physical activity:    Days per week: Not on file    Minutes per session: Not on file  . Stress: Not on file  Relationships  . Social connections:    Talks on phone: Not on file    Gets together: Not on file    Attends religious service: Not on file    Active member of club or organization: Not on file    Attends meetings of clubs or organizations: Not on file    Relationship status: Not on file  Other Topics Concern  . Not on file  Social History Narrative  . Not on file      Review of Systems see above; denies fever, headache, chest pain, dyspnea, abdominal pain, nausea, vomiting or bleeding.  Vital Signs: BP 128/77 (BP Location: Left Arm)   Pulse 71   Temp 97.7 F (36.5 C) (Oral)   Resp 16   Ht 5\' 2"  (1.575 m)   Wt 120 lb (54.4 kg)   SpO2 99%   BMI 21.95 kg/m   Physical Exam awake, alert.  Chest clear to auscultation bilaterally.  Heart with regular rate and rhythm.  Abdomen soft, positive bowel sounds, nontender.  No lower extremity edema.  She does have some weakness/ numbness /tremor left upper extremity  Imaging: Ct Chest Wo Contrast  Result Date: 08/08/2018 CLINICAL DATA:  Evaluate lung nodule EXAM: CT CHEST WITHOUT CONTRAST TECHNIQUE: Multidetector CT imaging of the chest was performed following the standard protocol without IV contrast. COMPARISON:  None available FINDINGS: Cardiovascular: No significant vascular findings. Normal heart size. No pericardial effusion. Mediastinum/Nodes: Noncalcified normal size mediastinal lymph nodes Lungs/Pleura: 6 mm nodule in the RIGHT upper lobe (image 61/5) Upper Abdomen: Limited view of the liver, kidneys, pancreas are unremarkable. Normal  adrenal glands. Musculoskeletal: Extensive lytic skeletal metastasis throughout the thoracic spine. Several rib tubal bodies are nearly completely replaced by lytic process (for example T1). Example lesion at T8 extends through the pedicle measures 2.3 cm. Multiple lytic lesions within the ribs and several pathologic fractures. Lytic lesion within the manubrium additionally. IMPRESSION: 1. Indeterminate RIGHT upper lobe pulmonary nodule. In patient with metastatic breast cancer, recommend short-term CT follow-up (3 months). 2. Calcified mediastinal lymph nodes could represent breast cancer metastasis, potentially treatment effect. 3. Extensive expansile metastatic lytic lesions throughout the spine and ribs. Electronically Signed   By: Suzy Bouchard M.D.   On: 08/08/2018 08:08   Ct Chest W Contrast  Result Date: 08/08/2018 CLINICAL DATA:  Metastatic breast cancer. EXAM: CT CHEST, ABDOMEN, AND PELVIS WITH CONTRAST TECHNIQUE: Multidetector CT imaging of the chest, abdomen and pelvis was performed following the standard protocol during bolus administration of intravenous contrast. CONTRAST:  150mL OMNIPAQUE IOHEXOL 300 MG/ML  SOLN COMPARISON:  Chest CT 08/08/2018 and lumbar spine MRI FINDINGS: CT CHEST FINDINGS Cardiovascular: The heart is normal in size. No pericardial effusion. Normal caliber thoracic aorta without dissection or significant atherosclerotic calcifications. The branch vessels are patent. No definite coronary artery calcifications. Mediastinum/Nodes: Mediastinal and bilateral hilar lymphadenopathy as demonstrated on the prior recent chest CT. 8.5 mm pretracheal lymph node on image number 22. 13 mm AP window node on image number 21. 15 mm right hilar node on image number 28. 11 mm left hilar lymph node on image number 23. Lungs/Pleura: Stable 6 mm right upper lobe pulmonary nodule on image number 65 most likely a metastatic focus. A do not however see any other definite metastatic pulmonary  nodules. Stable emphysematous changes and areas of pulmonary scarring. Chest wall/musculoskeletal: Skin thickening of the left breast is noted along with subareolar density. Deeper in the left breast there is a 16 mm soft tissue mass with a probable biopsy clip nearby. Extensive left axillary lymphadenopathy. Largest node measures 2.1 x 2.3 cm on image number 13. 10.5 mm left subclavicular node on image 4. 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 Evidence of tumor in the spinal canal on the left at T5, dorsally at T7, on the left side at T8 and ventrally and to the right at L2. There is no significant canal compromise at this time but findings are worrisome. CT ABDOMEN PELVIS FINDINGS Hepatobiliary: No focal hepatic lesions to suggest metastatic disease. Simple appearing cyst noted in segment 3. The portal and hepatic veins are patent. The gallbladder is surgically absent. No common bile duct dilatation. Pancreas: No mass, inflammation or ductal dilatation. Spleen: Normal size.  No gall lesions. Adrenals/Urinary Tract: The adrenal glands and kidneys are unremarkable. Small bilateral lower pole renal calculi. The bladder is normal. Stomach/Bowel: The stomach, duodenum, small bowel and colon are unremarkable. No acute inflammatory changes, mass lesions or obstructive findings. The terminal ileum and appendix are normal.  Vascular/Lymphatic: Scattered atherosclerotic calcifications involving the abdominal aorta and iliac arteries, advanced for age. No aneurysm or dissection. The branch vessels are patent. The major venous structures are patent. No mesenteric or retroperitoneal mass or adenopathy. Reproductive: The uterus and ovaries are normal. Other: No pelvic mass or adenopathy. No free pelvic fluid collections. No inguinal adenopathy. No subcutaneous lesions. Musculoskeletal: Extensive/widespread aggressive lytic destructive metastatic bone disease involving the  spine, pelvis and hips. Areas of cortical breakthrough and associated soft tissue masses involving the spine and pelvis. IMPRESSION: 1. Skin thickening of the left breast, subareolar masslike density and a smaller mass in the deep aspect of the left breast. Associated bulky metastatic left axillary lymphadenopathy. 2. Mediastinal and hilar lymphadenopathy and a single right upper lobe pulmonary nodule which is likely metastatic. 3. Widespread aggressive/lytic/destructive bony metastatic disease with areas of spinal canal involvement and areas of cortical breakthrough and associated soft tissue mass is most notably in the pelvis. 4. No findings to suggest abdominal/pelvic metastatic disease. Electronically Signed   By: Marijo Sanes M.D.   On: 08/08/2018 17:40   Mr Lumbar Spine Wo Contrast  Result Date: 08/08/2018 CLINICAL DATA:  Low back hip pain. EXAM: MRI LUMBAR SPINE WITHOUT CONTRAST TECHNIQUE: Multiplanar, multisequence MR imaging of the lumbar spine was performed. No intravenous contrast was administered. COMPARISON:  Overlapping portions from CT chest dated 08/08/2018 FINDINGS: Segmentation: The lowest lumbar type non-rib-bearing vertebra is labeled as L5. Alignment:  No vertebral subluxation is observed. Vertebrae: Extensive tumor infiltration of the visualized osseous structures. There are bulging margins of a variety of cortical surfaces including posteriorly and anteriorly along the sacrum, posteriorly along the L2 vertebral body eccentric to the right, and posteriorly along the L3 vertebral body as well as in both iliac bones, suspicious for malignancy. Schmorl's nodes or small central endplate fractures along the superior and inferior endplates of L3 raise the possibility that the posterior bulging at L3 could be from mild posterior bony retropulsion. Heterogeneous lesion throughout the posterior elements of the lumbar spine. Incidental 2.2 cm hemangioma the L2 vertebral body. Congenitally short  pedicles. Conus medullaris and cauda equina: Conus extends to the L1 level. Conus and cauda equina appear normal. Paraspinal and other soft tissues: Unremarkable Disc levels: L1-2: Unremarkable. L2-3: Mild right subarticular lateral recess stenosis due to posterior tumor extension along the vertebral margin, epidural extension of tumor is a distinct possibility. Borderline central narrowing of the thecal sac. L3-4: Posterior bony convexity at L3 potentially from epidural tumor or cortical bulging from tumor. Prominent central narrowing of the thecal sac primarily from short pedicles. L4-5: Prominent central narrowing of the thecal sac along with mild bilateral foraminal stenosis primarily from short pedicles, disc bulge, and facet arthropathy. L5-S1: Borderline right foraminal stenosis due to facet and intervertebral spurring. Suspected prior right laminectomy. IMPRESSION: 1. Nearly diffuse malignant involvement of the visualized bony structures. Posterior bony convexity at multiple levels especially L2 and L3 and also in the sacrum possibly from epidural extension of tumor as well as vertebral expansion from tumor. Given the left axillary adenopathy in left posterior breast mass on prior CT chest, this is presumably from metastatic breast cancer, correlate with patient history. 2. Lumbar spondylosis, degenerative disc disease, and congenitally short pedicles contribute to prominent central narrowing of the thecal sac L3-4 and L4-5 allow with mild foraminal impingement at L2-3 and L4-5, as detailed above. Electronically Signed   By: Van Clines M.D.   On: 08/08/2018 09:53   Ct Abdomen Pelvis W Contrast  Result Date: 08/08/2018 CLINICAL DATA:  Metastatic breast cancer. EXAM: CT CHEST, ABDOMEN, AND PELVIS WITH CONTRAST TECHNIQUE: Multidetector CT imaging of the chest, abdomen and pelvis was performed following the standard protocol during bolus administration of intravenous contrast. CONTRAST:  170mL  OMNIPAQUE IOHEXOL 300 MG/ML  SOLN COMPARISON:  Chest CT 08/08/2018 and lumbar spine MRI FINDINGS: CT CHEST FINDINGS Cardiovascular: The heart is normal in size. No pericardial effusion. Normal caliber thoracic aorta without dissection or significant atherosclerotic calcifications. The branch vessels are patent. No definite coronary artery calcifications. Mediastinum/Nodes: Mediastinal and bilateral hilar lymphadenopathy as demonstrated on the prior recent chest CT. 8.5 mm pretracheal lymph node on image number 22. 13 mm AP window node on image number 21. 15 mm right hilar node on image number 28. 11 mm left hilar lymph node on image number 23. Lungs/Pleura: Stable 6 mm right upper lobe pulmonary nodule on image number 65 most likely a metastatic focus. A do not however see any other definite metastatic pulmonary nodules. Stable emphysematous changes and areas of pulmonary scarring. Chest wall/musculoskeletal: Skin thickening of the left breast is noted along with subareolar density. Deeper in the left breast there is a 16 mm soft tissue mass with a probable biopsy clip nearby. Extensive left axillary lymphadenopathy. Largest node measures 2.1 x 2.3 cm on image number 13. 10.5 mm left subclavicular node on image 4. 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 Evidence of tumor in the spinal canal on the left at T5, dorsally at T7, on the left side at T8 and ventrally and to the right at L2. There is no significant canal compromise at this time but findings are worrisome. CT ABDOMEN PELVIS FINDINGS Hepatobiliary: No focal hepatic lesions to suggest metastatic disease. Simple appearing cyst noted in segment 3. The portal and hepatic veins are patent. The gallbladder is surgically absent. No common bile duct dilatation. Pancreas: No mass, inflammation or ductal dilatation. Spleen: Normal size.  No gall lesions. Adrenals/Urinary Tract: The adrenal glands and kidneys are  unremarkable. Small bilateral lower pole renal calculi. The bladder is normal. Stomach/Bowel: The stomach, duodenum, small bowel and colon are unremarkable. No acute inflammatory changes, mass lesions or obstructive findings. The terminal ileum and appendix are normal. Vascular/Lymphatic: Scattered atherosclerotic calcifications involving the abdominal aorta and iliac arteries, advanced for age. No aneurysm or dissection. The branch vessels are patent. The major venous structures are patent. No mesenteric or retroperitoneal mass or adenopathy. Reproductive: The uterus and ovaries are normal. Other: No pelvic mass or adenopathy. No free pelvic fluid collections. No inguinal adenopathy. No subcutaneous lesions. 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 Areas of cortical breakthrough and associated soft tissue masses involving the spine and pelvis. IMPRESSION: 1. Skin thickening of the left breast, subareolar masslike density and a smaller mass in the deep aspect of the left breast. Associated bulky metastatic left axillary lymphadenopathy. 2. Mediastinal and hilar lymphadenopathy and a single right upper lobe pulmonary nodule which is likely metastatic. 3. Widespread aggressive/lytic/destructive bony metastatic disease with areas of spinal canal involvement and areas of cortical breakthrough and associated soft tissue mass is most notably in the pelvis. 4. No findings to suggest abdominal/pelvic metastatic disease. Electronically Signed   By: Marijo Sanes M.D.   On: 08/08/2018 17:40    Labs:  CBC: Recent Labs    08/08/18 0647 08/09/18 0355  WBC 8.4 6.8  HGB 14.1 13.0  HCT 43.5 41.2  PLT 213 233  COAGS: Recent Labs    08/08/18 1632  INR 1.06    BMP: Recent Labs    08/08/18 0647 08/09/18 0355  NA 140 140  K 3.5 4.4  CL 108 108  CO2 20* 21*  GLUCOSE 105* 166*  BUN 18 20  CALCIUM 10.1 9.4    CREATININE 0.63 0.60  GFRNONAA >60 >60  GFRAA >60 >60    LIVER FUNCTION TESTS: Recent Labs    08/08/18 0647 08/09/18 0355  BILITOT 0.5 0.5  AST 54* 54*  ALT 50* 51*  ALKPHOS 134* 123  PROT 6.8 6.3*  ALBUMIN 4.1 3.4*    TUMOR MARKERS: No results for input(s): AFPTM, CEA, CA199, CHROMGRNA in the last 8760 hours.  Assessment and Plan: 62 y.o. female smoker who was admitted to [facility] yesterday with persistent back and hip pain along with left lateral arm/shoulder numbness and known left breast mass.  Further imaging has now revealed associated left axillary lymphadenopathy, mediastinal/hilar lymphadenopathy with right  upper lobe pulmonary nodule, widespread aggressive/lytic/destructive bony metastatic disease and associated soft tissue mass in pelvis.  She has no known history of cancer.  Request received for image guided biopsy for further evaluation.  Imaging studies have been reviewed by Dr. Barbie Banner and sacral mass appears amenable to biopsy.Risks and benefits discussed with the patient including, but not limited to bleeding, infection, damage to adjacent structures or low yield requiring additional tests.  All of the patient's questions were answered, patient is agreeable to proceed. Consent signed and in chart.  Procedure scheduled for today   Thank you for this interesting consult.  I greatly enjoyed meeting Gianina Olinde and look forward to participating in their care.  A copy of this report was sent to the requesting provider on this date.  Electronically Signed: D. Rowe Robert, PA-C 08/09/2018, 8:59 AM   I spent a total of  30 minutes   in face to face in clinical consultation, greater than 50% of which was counseling/coordinating care for CT guided sacral mass biopsy

## 2018-08-09 NOTE — Progress Notes (Signed)
Initial Nutrition Assessment  DOCUMENTATION CODES:   Not applicable  INTERVENTION:  Monitor for diet advancement and rec ONS, MVI at that time    NUTRITION DIAGNOSIS:   Increased nutrient needs related to cancer and cancer related treatments(breast cancer mets to bone) as evidenced by estimated needs, NPO status.   GOAL:   Patient will meet greater than or equal to 90% of their needs   MONITOR:   Diet advancement, Labs, Weight trends  REASON FOR ASSESSMENT:   Malnutrition Screening Tool    ASSESSMENT:  62 year old patient with medical history significant of breast cancer diagnosed in August after mammogram presented to ED with back and leg pain and LUE numbness x 5-6 weeks. CT shows widespread aggressive bone mets and mass in pelvis  Unable to see patient today, patient out of room for procedure  Per oncology note,patient was recommended to have breast biopsy and patient did not follow. 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   Patient has 100% of  1 documented meal and currently NPO  Medications reviewed and include: dexamethasone, pepcid, dilaudid  0.9% NaCl with KCl 63mEq/L @ 89mL/hr  Labs: Glucose 166 (H)    NUTRITION - FOCUSED PHYSICAL EXAM:  Unable to assess at initial visit  Diet Order:   Diet Order            Diet regular Room service appropriate? Yes; Fluid consistency: Thin  Diet effective now              EDUCATION NEEDS:   Not appropriate for education at this time  Skin:  Skin Assessment: Reviewed RN Assessment  Last BM:  08/07/2018  Height:   Ht Readings from Last 1 Encounters:  08/08/18 5\' 2"  (1.575 m)    Weight:   Wt Readings from Last 1 Encounters:  08/08/18 54.4 kg    Ideal Body Weight:  50 kg  BMI:  Body mass index is 21.95 kg/m.  Estimated Nutritional Needs:   Kcal:  9371-6967 (30-35g/kg)  Protein:  70-89g  Fluid:  </=1.9L/day    Lajuan Lines, RD, LDN  After  Hours/Weekend Pager: [phone]

## 2018-08-09 NOTE — Progress Notes (Signed)
   08/09/18 1500  Clinical Encounter Type  Visited With Patient  Visit Type Initial;Psychological support;Spiritual support  Referral From Nurse  Consult/Referral To Chaplain  Spiritual Encounters  Spiritual Needs Emotional;Grief support;Other (Comment) (Spiritual Care conversation/support)  Stress Factors  Patient Stress Factors Family relationships;Health changes;Major life changes;Financial concerns   I visited with Kristina Mcfarland per spiritual care consult. Kristina Mcfarland was very tearful during our visit. She stated that she was having a hard time dealing with her recent diagnosis and the thought of dying before seeing her grandchildren. Kristina Mcfarland talked about needing to "shut down for the night." She lacks family support due to her fear of being a burden on her family . Her children are currently helping to take care of her mother who has breast cancer. She said that her son's paternal grandparents and father passed away of cancer. She also has a long line of cancer in her own family. Kristina Mcfarland states that her father passed away from leukemia.  She currently is having anxiety around having to wait for her biopsy results. She fears that she won't get "100 percent" answers next week. She wants to wait and tell her family all at once about her medical issues so that she doesn't have to keep repeating herself. I provided spiritual support and talked with her about ways to ease her anxiety over the weekend.  She asked for me to follow-up with her.   Please, contact Spiritual care for further assistance.   Chaplain Shanon Ace M.Div., [facility]

## 2018-08-09 NOTE — Progress Notes (Signed)
Kristina Mcfarland   VQQ:[id number]   FI#:[id number]   CZY#:[id number]  Oncology follow up   Subjective: Patient underwent left sacral bone biopsy by Dr. Barbie Banner this afternoon, still has some pain, but overall tolerated procedure well.  She was seen by radiation oncology yesterday, and had CT simulation today.  She was sitting in the bed and eating dinner when I saw her.  She states her pain was severely aggravated during the transportation for multiple trips today.    Objective:  Vitals:   08/09/18 1350 08/09/18 1424  BP: 111/64 122/81  Pulse: 76 70  Resp: 10 18  Temp:  97.8 F (36.6 C)  SpO2: 98% 97%    Body mass index is 21.95 kg/m.  Intake/Output Summary (Last 24 hours) at 08/09/2018 1745 Last data filed at 08/09/2018 1724 Gross per 24 hour  Intake 1565.78 ml  Output 2 ml  Net 1563.78 ml     Sclerae unicteric  Oropharynx clear  (+) Bulky left axillary and left supraclavicular adenopathy  Lungs clear -- no rales or rhonchi  Heart regular rate and rhythm  Abdomen benign  MSK no focal spinal tenderness, no peripheral edema  Neuro nonfocal   CBG (last 3)  No results for input(s): GLUCAP in the last 72 hours.   Labs:  Lab Results  Component Value Date   WBC 6.8 08/09/2018   HGB 13.0 08/09/2018   HCT 41.2 08/09/2018   MCV 90.0 08/09/2018   PLT 233 08/09/2018   NEUTROABS 5.2 08/08/2018   CMP Latest Ref Rng & Units 08/09/2018 08/08/2018  Glucose 70 - 99 mg/dL 166(H) 105(H)  BUN 8 - 23 mg/dL 20 18  Creatinine 0.44 - 1.00 mg/dL 0.60 0.63  Sodium 135 - 145 mmol/L 140 140  Potassium 3.5 - 5.1 mmol/L 4.4 3.5  Chloride 98 - 111 mmol/L 108 108  CO2 22 - 32 mmol/L 21(L) 20(L)  Calcium 8.9 - 10.3 mg/dL 9.4 10.1  Total Protein 6.5 - 8.1 g/dL 6.3(L) 6.8  Total Bilirubin 0.3 - 1.2 mg/dL 0.5 0.5  Alkaline Phos 38 - 126 U/L 123 134(H)  AST 15 - 41 U/L 54(H) 54(H)  ALT 0 - 44 U/L 51(H) 50(H)    Urine Studies No results for input(s): UHGB, CRYS in the last 72 hours.  Invalid  input(s): UACOL, UAPR, USPG, UPH, UTP, UGL, UKET, UBIL, UNIT, UROB, ULEU, UEPI, UWBC, URBC, UBAC, CAST, UCOM, [place]  Basic Metabolic Panel: Recent Labs  Lab 08/08/18 0647 08/08/18 0700 08/09/18 0355  NA 140  --  140  K 3.5  --  4.4  CL 108  --  108  CO2 20*  --  21*  GLUCOSE 105*  --  166*  BUN 18  --  20  CREATININE 0.63  --  0.60  CALCIUM 10.1  --  9.4  MG  --  2.3  --   PHOS  --  4.9*  --    GFR Estimated Creatinine Clearance: 57.7 mL/min (by C-G formula based on SCr of 0.6 mg/dL). Liver Function Tests: Recent Labs  Lab 08/08/18 0647 08/09/18 0355  AST 54* 54*  ALT 50* 51*  ALKPHOS 134* 123  BILITOT 0.5 0.5  PROT 6.8 6.3*  ALBUMIN 4.1 3.4*   No results for input(s): LIPASE, AMYLASE in the last 168 hours. No results for input(s): AMMONIA in the last 168 hours. Coagulation profile Recent Labs  Lab 08/08/18 1632  INR 1.06    CBC: Recent Labs  Lab 08/08/18 0647 08/09/18 0355  WBC 8.4 6.8  NEUTROABS 5.2  --   HGB 14.1 13.0  HCT 43.5 41.2  MCV 89.0 90.0  PLT 213 233   Cardiac Enzymes: No results for input(s): CKTOTAL, CKMB, CKMBINDEX, TROPONINI in the last 168 hours. BNP: Invalid input(s): POCBNP CBG: No results for input(s): GLUCAP in the last 168 hours. D-Dimer No results for input(s): DDIMER in the last 72 hours. Hgb A1c No results for input(s): HGBA1C in the last 72 hours. Lipid Profile No results for input(s): CHOL, HDL, LDLCALC, TRIG, CHOLHDL, LDLDIRECT in the last 72 hours. Thyroid function studies No results for input(s): TSH, T4TOTAL, T3FREE, THYROIDAB in the last 72 hours.  Invalid input(s): FREET3 Anemia work up No results for input(s): VITAMINB12, FOLATE, FERRITIN, TIBC, IRON, RETICCTPCT in the last 72 hours. Microbiology No results found for this or any previous visit (from the past 240 hour(s)).    Studies:  Ct Chest Wo Contrast  Result Date: 08/08/2018 CLINICAL DATA:  Evaluate lung nodule EXAM: CT CHEST WITHOUT CONTRAST  TECHNIQUE: Multidetector CT imaging of the chest was performed following the standard protocol without IV contrast. COMPARISON:  None available FINDINGS: Cardiovascular: No significant vascular findings. Normal heart size. No pericardial effusion. Mediastinum/Nodes: Noncalcified normal size mediastinal lymph nodes Lungs/Pleura: 6 mm nodule in the RIGHT upper lobe (image 61/5) Upper Abdomen: Limited view of the liver, kidneys, pancreas are unremarkable. Normal adrenal glands. Musculoskeletal: Extensive lytic skeletal metastasis throughout the thoracic spine. Several rib tubal bodies are nearly completely replaced by lytic process (for example T1). Example lesion at T8 extends through the pedicle measures 2.3 cm. Multiple lytic lesions within the ribs and several pathologic fractures. Lytic lesion within the manubrium additionally. IMPRESSION: 1. Indeterminate RIGHT upper lobe pulmonary nodule. In patient with metastatic breast cancer, recommend short-term CT follow-up (3 months). 2. Calcified mediastinal lymph nodes could represent breast cancer metastasis, potentially treatment effect. 3. Extensive expansile metastatic lytic lesions throughout the spine and ribs. Electronically Signed   By: Suzy Bouchard M.D.   On: 08/08/2018 08:08   Ct Chest W Contrast  Result Date: 08/08/2018 CLINICAL DATA:  Metastatic breast cancer. EXAM: CT CHEST, ABDOMEN, AND PELVIS WITH CONTRAST TECHNIQUE: Multidetector CT imaging of the chest, abdomen and pelvis was performed following the standard protocol during bolus administration of intravenous contrast. CONTRAST:  174m OMNIPAQUE IOHEXOL 300 MG/ML  SOLN COMPARISON:  Chest CT 08/08/2018 and lumbar spine MRI FINDINGS: CT CHEST FINDINGS Cardiovascular: The heart is normal in size. No pericardial effusion. Normal caliber thoracic aorta without dissection or significant atherosclerotic calcifications. The branch vessels are patent. No definite coronary artery calcifications.  Mediastinum/Nodes: Mediastinal and bilateral hilar lymphadenopathy as demonstrated on the prior recent chest CT. 8.5 mm pretracheal lymph node on image number 22. 13 mm AP window node on image number 21. 15 mm right hilar node on image number 28. 11 mm left hilar lymph node on image number 23. Lungs/Pleura: Stable 6 mm right upper lobe pulmonary nodule on image number 65 most likely a metastatic focus. A do not however see any other definite metastatic pulmonary nodules. Stable emphysematous changes and areas of pulmonary scarring. Chest wall/musculoskeletal: Skin thickening of the left breast is noted along with subareolar density. Deeper in the left breast there is a 16 mm soft tissue mass with a probable biopsy clip nearby. Extensive left axillary lymphadenopathy. Largest node measures 2.1 x 2.3 cm on image number 13. 10.5 mm left subclavicular node on image 4. 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  Evidence of tumor in the spinal canal on the left at T5, dorsally at T7, on the left side at T8 and ventrally and to the right at L2. There is no significant canal compromise at this time but findings are worrisome. CT ABDOMEN PELVIS FINDINGS Hepatobiliary: No focal hepatic lesions to suggest metastatic disease. Simple appearing cyst noted in segment 3. The portal and hepatic veins are patent. The gallbladder is surgically absent. No common bile duct dilatation. Pancreas: No mass, inflammation or ductal dilatation. Spleen: Normal size.  No gall lesions. Adrenals/Urinary Tract: The adrenal glands and kidneys are unremarkable. Small bilateral lower pole renal calculi. The bladder is normal. Stomach/Bowel: The stomach, duodenum, small bowel and colon are unremarkable. No acute inflammatory changes, mass lesions or obstructive findings. The terminal ileum and appendix are normal. Vascular/Lymphatic: Scattered atherosclerotic calcifications involving the abdominal aorta  and iliac arteries, advanced for age. No aneurysm or dissection. The branch vessels are patent. The major venous structures are patent. No mesenteric or retroperitoneal mass or adenopathy. Reproductive: The uterus and ovaries are normal. Other: No pelvic mass or adenopathy. No free pelvic fluid collections. No inguinal adenopathy. No subcutaneous lesions. 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 Areas of cortical breakthrough and associated soft tissue masses involving the spine and pelvis. IMPRESSION: 1. Skin thickening of the left breast, subareolar masslike density and a smaller mass in the deep aspect of the left breast. Associated bulky metastatic left axillary lymphadenopathy. 2. Mediastinal and hilar lymphadenopathy and a single right upper lobe pulmonary nodule which is likely metastatic. 3. Widespread aggressive/lytic/destructive bony metastatic disease with areas of spinal canal involvement and areas of cortical breakthrough and associated soft tissue mass is most notably in the pelvis. 4. No findings to suggest abdominal/pelvic metastatic disease. Electronically Signed   By: Marijo Sanes M.D.   On: 08/08/2018 17:40   Mr Lumbar Spine Wo Contrast  Result Date: 08/08/2018 CLINICAL DATA:  Low back hip pain. EXAM: MRI LUMBAR SPINE WITHOUT CONTRAST TECHNIQUE: Multiplanar, multisequence MR imaging of the lumbar spine was performed. No intravenous contrast was administered. COMPARISON:  Overlapping portions from CT chest dated 08/08/2018 FINDINGS: Segmentation: The lowest lumbar type non-rib-bearing vertebra is labeled as L5. Alignment:  No vertebral subluxation is observed. Vertebrae: Extensive tumor infiltration of the visualized osseous structures. There are bulging margins of a variety of cortical surfaces including posteriorly and anteriorly along the sacrum, posteriorly along the L2 vertebral body eccentric to the  right, and posteriorly along the L3 vertebral body as well as in both iliac bones, suspicious for malignancy. Schmorl's nodes or small central endplate fractures along the superior and inferior endplates of L3 raise the possibility that the posterior bulging at L3 could be from mild posterior bony retropulsion. Heterogeneous lesion throughout the posterior elements of the lumbar spine. Incidental 2.2 cm hemangioma the L2 vertebral body. Congenitally short pedicles. Conus medullaris and cauda equina: Conus extends to the L1 level. Conus and cauda equina appear normal. Paraspinal and other soft tissues: Unremarkable Disc levels: L1-2: Unremarkable. L2-3: Mild right subarticular lateral recess stenosis due to posterior tumor extension along the vertebral margin, epidural extension of tumor is a distinct possibility. Borderline central narrowing of the thecal sac. L3-4: Posterior bony convexity at L3 potentially from epidural tumor or cortical bulging from tumor. Prominent central narrowing of the thecal sac primarily from short pedicles. L4-5: Prominent central narrowing of the thecal sac along with mild bilateral foraminal stenosis primarily from short pedicles, disc  bulge, and facet arthropathy. L5-S1: Borderline right foraminal stenosis due to facet and intervertebral spurring. Suspected prior right laminectomy. IMPRESSION: 1. Nearly diffuse malignant involvement of the visualized bony structures. Posterior bony convexity at multiple levels especially L2 and L3 and also in the sacrum possibly from epidural extension of tumor as well as vertebral expansion from tumor. Given the left axillary adenopathy in left posterior breast mass on prior CT chest, this is presumably from metastatic breast cancer, correlate with patient history. 2. Lumbar spondylosis, degenerative disc disease, and congenitally short pedicles contribute to prominent central narrowing of the thecal sac L3-4 and L4-5 allow with mild foraminal  impingement at L2-3 and L4-5, as detailed above. Electronically Signed   By: Van Clines M.D.   On: 08/08/2018 09:53   Ct Abdomen Pelvis W Contrast  Result Date: 08/08/2018 CLINICAL DATA:  Metastatic breast cancer. EXAM: CT CHEST, ABDOMEN, AND PELVIS WITH CONTRAST TECHNIQUE: Multidetector CT imaging of the chest, abdomen and pelvis was performed following the standard protocol during bolus administration of intravenous contrast. CONTRAST:  113m OMNIPAQUE IOHEXOL 300 MG/ML  SOLN COMPARISON:  Chest CT 08/08/2018 and lumbar spine MRI FINDINGS: CT CHEST FINDINGS Cardiovascular: The heart is normal in size. No pericardial effusion. Normal caliber thoracic aorta without dissection or significant atherosclerotic calcifications. The branch vessels are patent. No definite coronary artery calcifications. Mediastinum/Nodes: Mediastinal and bilateral hilar lymphadenopathy as demonstrated on the prior recent chest CT. 8.5 mm pretracheal lymph node on image number 22. 13 mm AP window node on image number 21. 15 mm right hilar node on image number 28. 11 mm left hilar lymph node on image number 23. Lungs/Pleura: Stable 6 mm right upper lobe pulmonary nodule on image number 65 most likely a metastatic focus. A do not however see any other definite metastatic pulmonary nodules. Stable emphysematous changes and areas of pulmonary scarring. Chest wall/musculoskeletal: Skin thickening of the left breast is noted along with subareolar density. Deeper in the left breast there is a 16 mm soft tissue mass with a probable biopsy clip nearby. Extensive left axillary lymphadenopathy. Largest node measures 2.1 x 2.3 cm on image number 13. 10.5 mm left subclavicular node on image 4. 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 Evidence of tumor in the spinal canal on the left at T5, dorsally at T7, on the left side at T8 and ventrally and to the right at L2. There is no significant  canal compromise at this time but findings are worrisome. CT ABDOMEN PELVIS FINDINGS Hepatobiliary: No focal hepatic lesions to suggest metastatic disease. Simple appearing cyst noted in segment 3. The portal and hepatic veins are patent. The gallbladder is surgically absent. No common bile duct dilatation. Pancreas: No mass, inflammation or ductal dilatation. Spleen: Normal size.  No gall lesions. Adrenals/Urinary Tract: The adrenal glands and kidneys are unremarkable. Small bilateral lower pole renal calculi. The bladder is normal. Stomach/Bowel: The stomach, duodenum, small bowel and colon are unremarkable. No acute inflammatory changes, mass lesions or obstructive findings. The terminal ileum and appendix are normal. Vascular/Lymphatic: Scattered atherosclerotic calcifications involving the abdominal aorta and iliac arteries, advanced for age. No aneurysm or dissection. The branch vessels are patent. The major venous structures are patent. No mesenteric or retroperitoneal mass or adenopathy. Reproductive: The uterus and ovaries are normal. Other: No pelvic mass or adenopathy. No free pelvic fluid collections. No inguinal adenopathy. No subcutaneous lesions. 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 Areas of  cortical breakthrough and associated soft tissue masses involving the spine and pelvis. IMPRESSION: 1. Skin thickening of the left breast, subareolar masslike density and a smaller mass in the deep aspect of the left breast. Associated bulky metastatic left axillary lymphadenopathy. 2. Mediastinal and hilar lymphadenopathy and a single right upper lobe pulmonary nodule which is likely metastatic. 3. Widespread aggressive/lytic/destructive bony metastatic disease with areas of spinal canal involvement and areas of cortical breakthrough and associated soft tissue mass is most notably in the pelvis. 4. No findings to suggest  abdominal/pelvic metastatic disease. Electronically Signed   By: Marijo Sanes M.D.   On: 08/08/2018 17:40   Ct Biopsy  Result Date: 08/09/2018 INDICATION: Multiple lytic bone lesions EXAM: CT BIOPSY MEDICATIONS: None. ANESTHESIA/SEDATION: Fentanyl 100 mcg IV; Versed 3 mg IV Moderate Sedation Time:  14 minutes The patient was continuously monitored during the procedure by the interventional radiology nurse under my direct supervision. FLUOROSCOPY TIME:  Fluoroscopy Time:  minutes  seconds ( mGy). COMPLICATIONS: None immediate. PROCEDURE: Informed written consent was obtained from the patient after a thorough discussion of the procedural risks, benefits and alternatives. All questions were addressed. Maximal Sterile Barrier Technique was utilized including caps, mask, sterile gowns, sterile gloves, sterile drape, hand hygiene and skin antiseptic. A timeout was performed prior to the initiation of the procedure. Under CT guidance, a(n) 17 gauge guide needle was advanced into the left sacral bone lesion. Subsequently, 3 18 gauge core biopsies were obtained. The guide needle was removed. Post biopsy images demonstrate no hemorrhage. Patient tolerated the procedure well without complication. Vital sign monitoring by nursing staff during the procedure will continue as patient is in the special procedures unit for post procedure observation. FINDINGS: The images document guide needle placement within the left sacral bone lesion. Post biopsy images demonstrate no hemorrhage. IMPRESSION: Successful CT-guided core biopsy of a left sacral bone lesion. Electronically Signed   By: Marybelle Killings M.D.   On: 08/09/2018 15:47    Assessment: 61 y.o. postmenopausal Caucasian female, with past medical history of SVT, palpable left breast mass 4 months ago, presented with severe low back and bilateral hip pain  1. 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 Left breast mass and axillary, [place] adenopathy, likely  malignant  3.  Severe back pain and low extremity weakness, secondary to #1 4. History of SVT    Recommendations: -I reviewed her staging CT scan findings with patient, which showed diffuse bone metastasis, left breast mass, left axillary and mediastinal adenopathy.  -Her bone biopsy results still pending, probably will be available by Monday.  However immunostain results, including ER, PR, HER-2 will take additional several days. -She has been seen by right arm, plan to start palliative radiation on Monday. -Please consult palliative care, to discuss the goal of care and symptom management.  Patient is very anxious and distressed due to the probable metastatic breast cancer.  I did reassure her that it is very treatable disease, and that she will improve significantly with the treatment. -I will see her back on Monday    Truitt Merle, MD 08/09/2018  5:45 PM

## 2018-08-09 NOTE — Consult Note (Signed)
Radiation Oncology         [phone]) [phone] ________________________________  Name: Kristina Mcfarland        MRN: [id number]  Date of Service: 08/08/2018 DOB: Jun 20, 1956  CC:[place], [place], [place]  No ref. provider found     REFERRING PHYSICIAN: Dr. Burr Medico  DIAGNOSIS: The primary encounter diagnosis was Low back pain with right-sided sciatica, unspecified back pain laterality, unspecified chronicity. 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   HISTORY OF PRESENT ILLNESS: Kristina Mcfarland is a 62 y.o. female seen at the request of Dr. 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 The patient noted a left breast mass several months ago and it's unclear exactly why she didn't proceed with evaluation but she states taking care of her husband and father in law have taken priority over her receiving medical care. She has been having progressive low back pain and in October quit her job due to this. She noticed increasing pain in the last 48 hours and presented to the ED where a CT chest without contrast revealed multiple lytic lesions throughout the spine and ribs. At T8 there was a lytic lesion extending through the pedicle measuring 2.3 cm, and sites like T1 that are almost completely replaced by tumor. She had an MRI of the lumbar spine without contrast revealed significant disease at L3 with central narrowing of the thecal sac, and similar findings along the L4-5 sites. There was nearly diffuse malignant appearing changes throughout the visualized bone. We were asked to see the patient to discuss options of palliative radiotherapy. She was seen leaving her room, and our consult was had in radiology while she was having her CT CAP and following this back in her room on the floor.     PREVIOUS RADIATION THERAPY: No   PAST MEDICAL HISTORY:    Past Medical History:  Diagnosis Date  . Breast cancer ([place]) 2019  . SVT (supraventricular tachycardia) ([place])        PAST SURGICAL HISTORY: Past Surgical History:  Procedure Laterality Date  . BACK SURGERY    . CARPAL TUNNEL RELEASE Bilateral   . CHOLECYSTECTOMY    . TUBAL LIGATION       FAMILY HISTORY: History reviewed. No pertinent family history.   SOCIAL HISTORY:  reports that she has been smoking. She does not have any smokeless tobacco history on file. She reports that she does not drink alcohol or use drugs. The patient is married and lives in [place]. 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 She has one son who helps her care for her husband and ailing father in [employer].    ALLERGIES: Oxycodone-acetaminophen and Other   MEDICATIONS:  Current Facility-Administered Medications  Medication Dose Route Frequency Provider Last Rate Last Dose  . 0.9 % NaCl with KCl 20 mEq/ L  infusion   Intravenous Continuous Reubin Milan, MD 88 mL/hr at 08/09/18 0500    . ALPRAZolam Duanne Moron) tablet 0.5 mg  0.5 mg Oral QHS PRN Reubin Milan, MD   0.5 mg at 08/08/18 2214  . dexamethasone (DECADRON) tablet 8 mg  8 mg Oral Q8H Truitt Merle, MD   8 mg at 08/09/18 0430  . famotidine (PEPCID) tablet 20 mg  20 mg Oral BID Reubin Milan, MD   20 mg at  08/08/18 2213  . feeding supplement (ENSURE ENLIVE) (ENSURE ENLIVE) liquid 237 mL  237 mL Oral BID BM Reubin Milan, MD      . HYDROmorphone (DILAUDID) injection 1 mg  1 mg Intravenous Q4H PRN Reubin Milan, MD   1 mg at 08/09/18 0426  . ketorolac (TORADOL) 30 MG/ML injection 30 mg  30 mg Intravenous Q6H PRN Reubin Milan, MD   30 mg at 08/08/18 1933  . ondansetron (ZOFRAN) tablet 4 mg  4 mg Oral Q6H PRN Reubin Milan, MD       Or  . ondansetron [facility]) injection 4 mg  4 mg Intravenous Q6H PRN Reubin Milan, MD         REVIEW OF SYSTEMS: On review of systems, the  patient reports that she is doing okay. She admits she's been under a lot of stress recently with caring for her alcoholic husband and ailing 57 year old father in [employer]. She recently found that she was having a harder time with stamina and had a few episodes of instability with walking due to pain she quit her job in October. She reports she has noted a left breat mass for several months. She was seen by a surgeon at [facility] but reports she did not know how to follow up with that provider. She reports she is not having any skin disruption over the breast on the left or nipple bleeding/discharge. She reports she is not having any loss of control of her bowel or bladder activities. She describes her pain as gnawing and aching in the mid back along her left shoulder blade and from her left shoulder all the way down her arm. She has terrible pain that is described as similar in the low back throughout the posterior hips bilaterally. She describes her pain has kept her from being able to stand at times. Her symptoms have been gradual, but in the last 2 days progressed rather quickly. She denies any chest pain, shortness of breath, cough, fevers, chills, night sweats. She's lost  denies any bowel or bladder disturbances, and denies abdominal pain, nausea or vomiting. She denies any additional musculoskeletal or joint aches or pains. A complete review of systems is obtained and is otherwise negative.     PHYSICAL EXAM:  Wt Readings from Last 3 Encounters:  08/08/18 120 lb (54.4 kg)   Temp Readings from Last 3 Encounters:  08/09/18 97.7 F (36.5 C) (Oral)  08/24/15 98.1 F (36.7 C) (Oral)   BP Readings from Last 3 Encounters:  08/09/18 128/77  08/24/15 123/88   Pulse Readings from Last 3 Encounters:  08/09/18 71  08/24/15 72   Pain Assessment Pain Score: Asleep/10  In general this is a chronically ill appearing unkept caucasian female in no acute distress. She is alert and oriented x4 and appropriate  throughout the examination. HEENT reveals that the patient is normocephalic, atraumatic. EOMs are intact. Skin is intact without any evidence of gross lesions. Cardiopulmonary assessment is negative for acute distress and she exhibits normal effort. She has about a 4 cm mass in the lateral left breast. It does not errode through the skin. She has fullness in the left axilla and palpable adenopathy along the left supraclavicular region. Lower extremities are negative for pretibial pitting edema, deep calf tenderness, cyanosis or clubbing. She has intact sensory perception to light touch in the upper and lower extremities bilaterally with 4/5 strength in bilateral lower extremities.    ECOG = 3  0 - Asymptomatic (Fully active, able to carry on all predisease activities without restriction)  1 - Symptomatic but completely ambulatory (Restricted in physically strenuous activity but ambulatory and able to carry out work of a light or sedentary nature. For example, light housework, office work)  2 - Symptomatic, <50% in bed during the day (Ambulatory and capable of all self care but unable to carry out any work activities. Up and about more than 50% of waking hours)  3 - Symptomatic, >50% in bed, but not bedbound (Capable of only limited self-care, confined to bed or chair 50% or more of waking hours)  4 - Bedbound (Completely disabled. Cannot carry on any self-care. Totally confined to bed or chair)  5 - Death   Eustace Pen MM, Creech RH, Tormey DC, et al. [phone]). "Toxicity and response criteria of the [facility]". [place] Oncol. 5 (6): 649-55    LABORATORY DATA:  Lab Results  Component Value Date   WBC 6.8 08/09/2018   HGB 13.0 08/09/2018   HCT 41.2 08/09/2018   MCV 90.0 08/09/2018   PLT 233 08/09/2018   Lab Results  Component Value Date   NA 140 08/09/2018   K 4.4 08/09/2018   CL 108 08/09/2018   CO2 21 (L) 08/09/2018   Lab Results  Component Value Date   ALT  51 (H) 08/09/2018   AST 54 (H) 08/09/2018   ALKPHOS 123 08/09/2018   BILITOT 0.5 08/09/2018      RADIOGRAPHY: Ct Chest Wo Contrast  Result Date: 08/08/2018 CLINICAL DATA:  Evaluate lung nodule EXAM: CT CHEST WITHOUT CONTRAST TECHNIQUE: Multidetector CT imaging of the chest was performed following the standard protocol without IV contrast. COMPARISON:  None available FINDINGS: Cardiovascular: No significant vascular findings. Normal heart size. No pericardial effusion. Mediastinum/Nodes: Noncalcified normal size mediastinal lymph nodes Lungs/Pleura: 6 mm nodule in the RIGHT upper lobe (image 61/5) Upper Abdomen: Limited view of the liver, kidneys, pancreas are unremarkable. Normal adrenal glands. Musculoskeletal: Extensive lytic skeletal metastasis throughout the thoracic spine. Several rib tubal bodies are nearly completely replaced by lytic process (for example T1). Example lesion at T8 extends through the pedicle measures 2.3 cm. Multiple lytic lesions within the ribs and several pathologic fractures. Lytic lesion within the manubrium additionally. IMPRESSION: 1. Indeterminate RIGHT upper lobe pulmonary nodule. In patient with metastatic breast cancer, recommend short-term CT follow-up (3 months). 2. Calcified mediastinal lymph nodes could represent breast cancer metastasis, potentially treatment effect. 3. Extensive expansile metastatic lytic lesions throughout the spine and ribs. Electronically Signed   By: Suzy Bouchard M.D.   On: 08/08/2018 08:08   Ct Chest W Contrast  Result Date: 08/08/2018 CLINICAL DATA:  Metastatic breast cancer. EXAM: CT CHEST, ABDOMEN, AND PELVIS WITH CONTRAST TECHNIQUE: Multidetector CT imaging of the chest, abdomen and pelvis was performed following the standard protocol during bolus administration of intravenous contrast. CONTRAST:  139mL OMNIPAQUE IOHEXOL 300 MG/ML  SOLN COMPARISON:  Chest CT 08/08/2018 and lumbar spine MRI FINDINGS: CT CHEST FINDINGS  Cardiovascular: The heart is normal in size. No pericardial effusion. Normal caliber thoracic aorta without dissection or significant atherosclerotic calcifications. The branch vessels are patent. No definite coronary artery calcifications. Mediastinum/Nodes: Mediastinal and bilateral hilar lymphadenopathy as demonstrated on the prior recent chest CT. 8.5 mm pretracheal lymph node on image number 22. 13 mm AP window node on image number 21. 15 mm right hilar node on image number 28. 11 mm left hilar lymph node on image number 23. Lungs/Pleura:  Stable 6 mm right upper lobe pulmonary nodule on image number 65 most likely a metastatic focus. A do not however see any other definite metastatic pulmonary nodules. Stable emphysematous changes and areas of pulmonary scarring. Chest wall/musculoskeletal: Skin thickening of the left breast is noted along with subareolar density. Deeper in the left breast there is a 16 mm soft tissue mass with a probable biopsy clip nearby. Extensive left axillary lymphadenopathy. Largest node measures 2.1 x 2.3 cm on image number 13. 10.5 mm left subclavicular node on image 4. 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 Evidence of tumor in the spinal canal on the left at T5, dorsally at T7, on the left side at T8 and ventrally and to the right at L2. There is no significant canal compromise at this time but findings are worrisome. CT ABDOMEN PELVIS FINDINGS Hepatobiliary: No focal hepatic lesions to suggest metastatic disease. Simple appearing cyst noted in segment 3. The portal and hepatic veins are patent. The gallbladder is surgically absent. No common bile duct dilatation. Pancreas: No mass, inflammation or ductal dilatation. Spleen: Normal size.  No gall lesions. Adrenals/Urinary Tract: The adrenal glands and kidneys are unremarkable. Small bilateral lower pole renal calculi. The bladder is normal. Stomach/Bowel: The stomach, duodenum,  small bowel and colon are unremarkable. No acute inflammatory changes, mass lesions or obstructive findings. The terminal ileum and appendix are normal. Vascular/Lymphatic: Scattered atherosclerotic calcifications involving the abdominal aorta and iliac arteries, advanced for age. No aneurysm or dissection. The branch vessels are patent. The major venous structures are patent. No mesenteric or retroperitoneal mass or adenopathy. Reproductive: The uterus and ovaries are normal. Other: No pelvic mass or adenopathy. No free pelvic fluid collections. No inguinal adenopathy. No subcutaneous lesions. 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 Areas of cortical breakthrough and associated soft tissue masses involving the spine and pelvis. IMPRESSION: 1. Skin thickening of the left breast, subareolar masslike density and a smaller mass in the deep aspect of the left breast. Associated bulky metastatic left axillary lymphadenopathy. 2. Mediastinal and hilar lymphadenopathy and a single right upper lobe pulmonary nodule which is likely metastatic. 3. Widespread aggressive/lytic/destructive bony metastatic disease with areas of spinal canal involvement and areas of cortical breakthrough and associated soft tissue mass is most notably in the pelvis. 4. No findings to suggest abdominal/pelvic metastatic disease. Electronically Signed   By: Marijo Sanes M.D.   On: 08/08/2018 17:40   Mr Lumbar Spine Wo Contrast  Result Date: 08/08/2018 CLINICAL DATA:  Low back hip pain. EXAM: MRI LUMBAR SPINE WITHOUT CONTRAST TECHNIQUE: Multiplanar, multisequence MR imaging of the lumbar spine was performed. No intravenous contrast was administered. COMPARISON:  Overlapping portions from CT chest dated 08/08/2018 FINDINGS: Segmentation: The lowest lumbar type non-rib-bearing vertebra is labeled as L5. Alignment:  No vertebral subluxation is observed.  Vertebrae: Extensive tumor infiltration of the visualized osseous structures. There are bulging margins of a variety of cortical surfaces including posteriorly and anteriorly along the sacrum, posteriorly along the L2 vertebral body eccentric to the right, and posteriorly along the L3 vertebral body as well as in both iliac bones, suspicious for malignancy. Schmorl's nodes or small central endplate fractures along the superior and inferior endplates of L3 raise the possibility that the posterior bulging at L3 could be from mild posterior bony retropulsion. Heterogeneous lesion throughout the posterior elements of the lumbar spine. Incidental 2.2 cm hemangioma the L2 vertebral body. Congenitally short pedicles. Conus  medullaris and cauda equina: Conus extends to the L1 level. Conus and cauda equina appear normal. Paraspinal and other soft tissues: Unremarkable Disc levels: L1-2: Unremarkable. L2-3: Mild right subarticular lateral recess stenosis due to posterior tumor extension along the vertebral margin, epidural extension of tumor is a distinct possibility. Borderline central narrowing of the thecal sac. L3-4: Posterior bony convexity at L3 potentially from epidural tumor or cortical bulging from tumor. Prominent central narrowing of the thecal sac primarily from short pedicles. L4-5: Prominent central narrowing of the thecal sac along with mild bilateral foraminal stenosis primarily from short pedicles, disc bulge, and facet arthropathy. L5-S1: Borderline right foraminal stenosis due to facet and intervertebral spurring. Suspected prior right laminectomy. IMPRESSION: 1. Nearly diffuse malignant involvement of the visualized bony structures. Posterior bony convexity at multiple levels especially L2 and L3 and also in the sacrum possibly from epidural extension of tumor as well as vertebral expansion from tumor. Given the left axillary adenopathy in left posterior breast mass on prior CT chest, this is presumably  from metastatic breast cancer, correlate with patient history. 2. Lumbar spondylosis, degenerative disc disease, and congenitally short pedicles contribute to prominent central narrowing of the thecal sac L3-4 and L4-5 allow with mild foraminal impingement at L2-3 and L4-5, as detailed above. Electronically Signed   By: Van Clines M.D.   On: 08/08/2018 09:53   Ct Abdomen Pelvis W Contrast  Result Date: 08/08/2018 CLINICAL DATA:  Metastatic breast cancer. EXAM: CT CHEST, ABDOMEN, AND PELVIS WITH CONTRAST TECHNIQUE: Multidetector CT imaging of the chest, abdomen and pelvis was performed following the standard protocol during bolus administration of intravenous contrast. CONTRAST:  138mL OMNIPAQUE IOHEXOL 300 MG/ML  SOLN COMPARISON:  Chest CT 08/08/2018 and lumbar spine MRI FINDINGS: CT CHEST FINDINGS Cardiovascular: The heart is normal in size. No pericardial effusion. Normal caliber thoracic aorta without dissection or significant atherosclerotic calcifications. The branch vessels are patent. No definite coronary artery calcifications. Mediastinum/Nodes: Mediastinal and bilateral hilar lymphadenopathy as demonstrated on the prior recent chest CT. 8.5 mm pretracheal lymph node on image number 22. 13 mm AP window node on image number 21. 15 mm right hilar node on image number 28. 11 mm left hilar lymph node on image number 23. Lungs/Pleura: Stable 6 mm right upper lobe pulmonary nodule on image number 65 most likely a metastatic focus. A do not however see any other definite metastatic pulmonary nodules. Stable emphysematous changes and areas of pulmonary scarring. Chest wall/musculoskeletal: Skin thickening of the left breast is noted along with subareolar density. Deeper in the left breast there is a 16 mm soft tissue mass with a probable biopsy clip nearby. Extensive left axillary lymphadenopathy. Largest node measures 2.1 x 2.3 cm on image number 13. 10.5 mm left subclavicular node on image 4.  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 Evidence of tumor in the spinal canal on the left at T5, dorsally at T7, on the left side at T8 and ventrally and to the right at L2. There is no significant canal compromise at this time but findings are worrisome. CT ABDOMEN PELVIS FINDINGS Hepatobiliary: No focal hepatic lesions to suggest metastatic disease. Simple appearing cyst noted in segment 3. The portal and hepatic veins are patent. The gallbladder is surgically absent. No common bile duct dilatation. Pancreas: No mass, inflammation or ductal dilatation. Spleen: Normal size.  No gall lesions. Adrenals/Urinary Tract: The adrenal glands and kidneys are unremarkable. Small bilateral lower pole renal calculi. The bladder is normal.  Stomach/Bowel: The stomach, duodenum, small bowel and colon are unremarkable. No acute inflammatory changes, mass lesions or obstructive findings. The terminal ileum and appendix are normal. Vascular/Lymphatic: Scattered atherosclerotic calcifications involving the abdominal aorta and iliac arteries, advanced for age. No aneurysm or dissection. The branch vessels are patent. The major venous structures are patent. No mesenteric or retroperitoneal mass or adenopathy. Reproductive: The uterus and ovaries are normal. Other: No pelvic mass or adenopathy. No free pelvic fluid collections. No inguinal adenopathy. No subcutaneous lesions. 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 Areas of cortical breakthrough and associated soft tissue masses involving the spine and pelvis. IMPRESSION: 1. Skin thickening of the left breast, subareolar masslike density and a smaller mass in the deep aspect of the left breast. Associated bulky metastatic left axillary lymphadenopathy. 2. Mediastinal and hilar lymphadenopathy and a single right upper lobe pulmonary nodule which is  likely metastatic. 3. Widespread aggressive/lytic/destructive bony metastatic disease with areas of spinal canal involvement and areas of cortical breakthrough and associated soft tissue mass is most notably in the pelvis. 4. No findings to suggest abdominal/pelvic metastatic disease. Electronically Signed   By: Marijo Sanes M.D.   On: 08/08/2018 17:40       IMPRESSION/PLAN: 1. Probable Stage IV left breast cancer metastasized to nodes and bone. Dr. Lisbeth Renshaw has reviewed this patient's case with Dr. Burr Medico and we've discussed the findings so far. I explained the concerns for advanced breast cancer and we reviewed the CT findings from her ED visit as well as her MRI of the lumbar spine. Her CT from tonight is not yet read but I also discussed that in addition to disease in T1, T8, and the lumbosacral spine, the survey films that the CT techs showed me indicated significant disease in the sacrum at the SI joint and particularly along the right iliac region. These sites account for her sites of pain. She also has symptoms suspicious for disease in the cervical spine. I've discussed with Dr. Lisbeth Renshaw who will decide if she needs additional spinal imaging with MRI. I spent time with the patient and discussed that we would need tissue confirmation of her suspected disease. She is scheduled for IR biopsy of her left supraclavicular adenopathy. We did discuss the  the nature of breast disease. While her systemic therapy is to be determined based on pathologic findings and prognostic panels however we reviewed the rationale for radiotherapy to palliative her symptoms in the thoracic, lumbar, sacral, and iliac sites. We discussed the risks, benefits, short, and long term effects of radiotherapy, and the patient is interested in proceeding. I reviewed the delivery and logistics of radiotherapy and Dr. Lisbeth Renshaw anticipates a course of 2 weeks of radiotherapy. Written consent is obtained and placed in the chart, a copy was provided  to the patient. She will simulate in the morning, and we anticipate starting her therapy on 08/12/18 provided we have her preliminary pathologic results. She is in agreement. She will continue her current pain regimen.   In a visit lasting 70 minutes, greater than 50% of the time was spent face to face discussing her case, and coordinating the patient's care.     Carola Rhine, PAC

## 2018-08-09 NOTE — Procedures (Signed)
Left sacral bone biopsy 18g times three EBL 0 Comp 0

## 2018-08-10 ENCOUNTER — Observation Stay (HOSPITAL_COMMUNITY): Payer: Self-pay

## 2018-08-10 DIAGNOSIS — Z515 Encounter for palliative care: Secondary | ICD-10-CM

## 2018-08-10 DIAGNOSIS — R945 Abnormal results of liver function studies: Secondary | ICD-10-CM

## 2018-08-10 DIAGNOSIS — Z7189 Other specified counseling: Secondary | ICD-10-CM

## 2018-08-10 DIAGNOSIS — G893 Neoplasm related pain (acute) (chronic): Secondary | ICD-10-CM

## 2018-08-10 DIAGNOSIS — K5900 Constipation, unspecified: Secondary | ICD-10-CM

## 2018-08-10 MED ORDER — GADOBUTROL 1 MMOL/ML IV SOLN
7.5000 mL | Freq: Once | INTRAVENOUS | Status: AC | PRN
  Administered 2018-08-10: 6 mL via INTRAVENOUS

## 2018-08-10 MED ORDER — ZOLPIDEM TARTRATE 5 MG PO TABS
5.0000 mg | ORAL_TABLET | Freq: Every evening | ORAL | Status: DC | PRN
  Administered 2018-08-10: 5 mg via ORAL
  Filled 2018-08-10: qty 1

## 2018-08-10 NOTE — Progress Notes (Signed)
PROGRESS NOTE  Kristina Mcfarland TFT:[id number] DOB: [birth year] DOA: 08/08/2018 PCP: Willeen Niece, PA  HPI/Recap of past 24 hours:  Pain is better controlled today, no fever Reports being constipated Poor oral intake, on iv hydration Emotional when talking about her diagnosis  Assessment/Plan: Principal Problem:   Breast cancer metastasized to bone [facility]) Active Problems:   Paroxysmal SVT (supraventricular tachycardia) (HCC)   Abnormal LFTs   Back pain   Palliative care by specialist   Goals of care, counseling/discussion    1.  Stage IV metastatic breast cancer. Extensive metastatic disease based on the CT and MRI. Oncology /rad onc consulted, input appreciated IR was consulted for biopsy of the lesions, biopsy done on 12/27, path result pending  2.  Cancer-related pain. Reports severe pain in his back. Will adjust pain medication regimen and monitor. Palliative care is also consulted for pain management. Appreciate their assistance.  3.  History of SVT. Monitor for now.  4.  Abnormal LFT. Monitor for now. Secondary to static disease.  Constipation: stool softener  FTT: will need PT eval once pain control is adequate   Code Status: full  Family Communication: patient   Disposition Plan: pending   Consultants:  Oncology  Rad onc  IR  Procedures:  Biopsy left sacral bone lesion   Antibiotics:  none   Objective: BP 129/74 (BP Location: Left Arm)   Pulse 72   Temp 97.9 F (36.6 C) (Oral)   Resp 18   Ht 5\' 2"  (1.575 m)   Wt 54.4 kg   SpO2 99%   BMI 21.95 kg/m   Intake/Output Summary (Last 24 hours) at 113-Sep-202019 1405 Last data filed at 113-Sep-202019 0405 Gross per 24 hour  Intake 1454.34 ml  Output 1 ml  Net 1453.34 ml   Filed Weights   08/08/18 0613 08/08/18 1404  Weight: 54.4 kg 54.4 kg    Exam: Patient is examined daily including today on 113-Sep-202019, exams remain the same as of yesterday except that has changed     General:  NAD  Cardiovascular: RRR  Respiratory: CTABL  Abdomen: Soft/ND/NT, positive BS  Musculoskeletal: No Edema  Neuro: alert, oriented   Data Reviewed: Basic Metabolic Panel: Recent Labs  Lab 08/08/18 0647 08/08/18 0700 08/09/18 0355  NA 140  --  140  K 3.5  --  4.4  CL 108  --  108  CO2 20*  --  21*  GLUCOSE 105*  --  166*  BUN 18  --  20  CREATININE 0.63  --  0.60  CALCIUM 10.1  --  9.4  MG  --  2.3  --   PHOS  --  4.9*  --    Liver Function Tests: Recent Labs  Lab 08/08/18 0647 08/09/18 0355  AST 54* 54*  ALT 50* 51*  ALKPHOS 134* 123  BILITOT 0.5 0.5  PROT 6.8 6.3*  ALBUMIN 4.1 3.4*   No results for input(s): LIPASE, AMYLASE in the last 168 hours. No results for input(s): AMMONIA in the last 168 hours. CBC: Recent Labs  Lab 08/08/18 0647 08/09/18 0355  WBC 8.4 6.8  NEUTROABS 5.2  --   HGB 14.1 13.0  HCT 43.5 41.2  MCV 89.0 90.0  PLT 213 233   Cardiac Enzymes:   No results for input(s): CKTOTAL, CKMB, CKMBINDEX, TROPONINI in the last 168 hours. BNP (last 3 results) No results for input(s): BNP in the last 8760 hours.  ProBNP (last 3 results) No results for input(s): PROBNP in  the last 8760 hours.  CBG: No results for input(s): GLUCAP in the last 168 hours.  No results found for this or any previous visit (from the past 240 hour(s)).   Studies: Ct Biopsy  Result Date: Sep 04, 2018 INDICATION: Multiple lytic bone lesions EXAM: CT BIOPSY MEDICATIONS: None. ANESTHESIA/SEDATION: Fentanyl 100 mcg IV; Versed 3 mg IV Moderate Sedation Time:  14 minutes The patient was continuously monitored during the procedure by the interventional radiology nurse under my direct supervision. FLUOROSCOPY TIME:  Fluoroscopy Time:  minutes  seconds ( mGy). COMPLICATIONS: None immediate. PROCEDURE: Informed written consent was obtained from the patient after a thorough discussion of the procedural risks, benefits and alternatives. All questions were  addressed. Maximal Sterile Barrier Technique was utilized including caps, mask, sterile gowns, sterile gloves, sterile drape, hand hygiene and skin antiseptic. A timeout was performed prior to the initiation of the procedure. Under CT guidance, a(n) 17 gauge guide needle was advanced into the left sacral bone lesion. Subsequently, 3 18 gauge core biopsies were obtained. The guide needle was removed. Post biopsy images demonstrate no hemorrhage. Patient tolerated the procedure well without complication. Vital sign monitoring by nursing staff during the procedure will continue as patient is in the special procedures unit for post procedure observation. FINDINGS: The images document guide needle placement within the left sacral bone lesion. Post biopsy images demonstrate no hemorrhage. IMPRESSION: Successful CT-guided core biopsy of a left sacral bone lesion. Electronically Signed   By: Marybelle Killings M.D.   On: 09/04/2018 15:47    Scheduled Meds: . dexamethasone  8 mg Oral Q8H  . famotidine  20 mg Oral BID  . feeding supplement (ENSURE ENLIVE)  237 mL Oral BID BM  . ketorolac  30 mg Intravenous Q6H  . methocarbamol  500 mg Oral TID  . polyethylene glycol  17 g Oral Daily  . senna-docusate  1 tablet Oral BID    Continuous Infusions: . sodium chloride 75 mL/hr at 08/10/18 0433     Time spent: 65mins I have personally reviewed and interpreted on  110-12-202019 daily labs,  imagings as discussed above under date review session and assessment and plans.  I reviewed all nursing notes, pharmacy notes, consultant notes,  vitals, pertinent old records  I have discussed plan of care as described above with RN , patient  on 110-12-202019   Florencia Reasons MD, PhD  [facility] Pager [phone]. If 7PM-7AM, please contact night-coverage at [email or web address], password [facility] 110-12-202019, 2:05 PM  LOS: 0 days

## 2018-08-10 NOTE — Progress Notes (Signed)
The patient has undergone simulation for radiation treatment planning.  We will follow-up on her upcoming biopsy results and plan to proceed radiation treatment as soon as possible early next week.  2 separate target areas have been identified to treat both the lumbar spine/pelvis as well as the mid to lower thoracic spine.  The patient has extensive lytic disease throughout the spine and elsewhere as seen on her recent CT imaging.  Multiple areas are noted in close proximity to the spinal canal.  I have therefore ordered an MRI scan of the cervical and thoracic spine to be completed as well to make sure there are not additional concerning areas which would require radiation treatment.  ------------------------------------------------  Jodelle Gross, MD, PhD

## 2018-08-11 ENCOUNTER — Encounter (HOSPITAL_COMMUNITY): Payer: Self-pay | Admitting: *Deleted

## 2018-08-11 MED ORDER — POLYETHYLENE GLYCOL 3350 17 G PO PACK
17.0000 g | PACK | Freq: Two times a day (BID) | ORAL | Status: DC
  Administered 2018-08-11 – 2018-08-18 (×8): 17 g via ORAL
  Filled 2018-08-11 (×9): qty 1

## 2018-08-11 MED ORDER — INFLUENZA VAC SPLIT QUAD 0.5 ML IM SUSY
0.5000 mL | PREFILLED_SYRINGE | INTRAMUSCULAR | Status: AC
  Administered 2018-08-11: 0.5 mL via INTRAMUSCULAR
  Filled 2018-08-11: qty 0.5

## 2018-08-11 MED ORDER — LIDOCAINE 5 % EX PTCH
1.0000 | MEDICATED_PATCH | CUTANEOUS | Status: DC
  Administered 2018-08-11 – 2018-08-18 (×8): 1 via TRANSDERMAL
  Filled 2018-08-11 (×9): qty 1

## 2018-08-11 MED ORDER — ALPRAZOLAM 0.5 MG PO TABS
0.5000 mg | ORAL_TABLET | Freq: Every evening | ORAL | Status: DC | PRN
  Administered 2018-08-11 – 2018-08-14 (×4): 0.5 mg via ORAL
  Filled 2018-08-11 (×5): qty 1

## 2018-08-11 NOTE — Progress Notes (Signed)
MD, Patient needs VTE prophylaxis ordered please. Thanks.Roderick Pee

## 2018-08-11 NOTE — Progress Notes (Signed)
PMT progress note  The patient is resting in bed, on one side, with covers drawn over her face.   She states that her back is hurting all over.  She doesn't wish to talk further or discuss with me.   There is no family at the bedside.   BP 113/75 (BP Location: Right Arm)   Pulse 71   Temp 98.2 F (36.8 C) (Oral)   Resp 16   Ht 5\' 2"  (1.575 m)   Wt 54.4 kg   SpO2 97%   BMI 21.95 kg/m  Labs and imaging noted Chart reviewed  PPS 40%  Biopsy pending Rad onc following, appreciate their input  Pain medication needs noted, will add heating pad, will add Lidoderm patch and monitor. Continue current opioids.   Resting in bed Avoids physical exam Appears weak Breathing ok No edema  Stage IV metastatic breast cancer S/p biopsy, path results pending Seen by rad onc simulation done Continue opioids for cancer related pain Continue bowel regimen Will need SNF rehab with palliative care to follow on discharge.   25 minutes spent Loistine Chance MD [facility] health palliative medicine team [phone]

## 2018-08-11 NOTE — Progress Notes (Signed)
PROGRESS NOTE  Lorelle Macaluso JJH:[id number] DOB: [birth year] DOA: 08/08/2018 PCP: Willeen Niece, PA  HPI/Recap of past 24 hours:  Talking to visitors,   Still has pain (lower back, pelvic region) with movement, prefer stay in bed, no fever Reports still  being constipated Poor oral intake, on iv hydration, she prefers to stay on ivf today Emotional when talking about her diagnosis at times  Assessment/Plan: Principal Problem:   Breast cancer metastasized to bone [facility]) Active Problems:   Paroxysmal SVT (supraventricular tachycardia) (HCC)   Abnormal LFTs   Back pain   Palliative care by specialist   Goals of care, counseling/discussion   Cancer associated pain   Constipation    1.  Stage IV metastatic breast cancer. Extensive metastatic disease based on the CT and MRI. Diffuse metastatic disease involving the entire cervical and thoracic spine with multiple pathologic fractures, Epidural enhancement after contrast administration in the cervical and thoracic spine consistent with tumor On dexamthasone, toradol, lidocaine patch, robaxin, prn dilaudid Oncology /rad onc consulted, input appreciated IR was consulted for biopsy of the lesions, biopsy done on 12/27, path result pending  2.  Cancer-related pain. Reports severe pain in her back, pelvic region Will adjust pain medication regimen and monitor. Palliative care is also consulted for pain management. Appreciate their assistance.  3.  History of SVT. Monitor for now.  4.  Abnormal LFT. Monitor for now. Secondary to static disease.  Constipation: stool softener  FTT: will need PT eval once pain control is adequate   Code Status: full  Family Communication: patient   Disposition Plan: pending   Consultants:  Oncology  Rad onc  IR  Palliative care  Procedures:  Biopsy left sacral bone lesion   Antibiotics:  none   Objective: BP (!) 150/94 (BP Location: Left Arm)   Pulse 61   Temp  97.9 F (36.6 C) (Oral)   Resp 16   Ht 5\' 2"  (1.575 m)   Wt 54.4 kg   SpO2 95%   BMI 21.95 kg/m   Intake/Output Summary (Last 24 hours) at 08/11/2018 1359 Last data filed at 08/11/2018 1100 Gross per 24 hour  Intake 2465.84 ml  Output 1000 ml  Net 1465.84 ml   Filed Weights   08/08/18 0613 08/08/18 1404  Weight: 54.4 kg 54.4 kg    Exam: Patient is examined daily including today on 08/11/2018, exams remain the same as of yesterday except that has changed    General:  NAD  Cardiovascular: RRR  Respiratory: CTABL  Abdomen: Soft/ND/NT, positive BS  Musculoskeletal: No Edema  Neuro: alert, oriented   Data Reviewed: Basic Metabolic Panel: Recent Labs  Lab 08/08/18 0647 08/08/18 0700 08/09/18 0355  NA 140  --  140  K 3.5  --  4.4  CL 108  --  108  CO2 20*  --  21*  GLUCOSE 105*  --  166*  BUN 18  --  20  CREATININE 0.63  --  0.60  CALCIUM 10.1  --  9.4  MG  --  2.3  --   PHOS  --  4.9*  --    Liver Function Tests: Recent Labs  Lab 08/08/18 0647 08/09/18 0355  AST 54* 54*  ALT 50* 51*  ALKPHOS 134* 123  BILITOT 0.5 0.5  PROT 6.8 6.3*  ALBUMIN 4.1 3.4*   No results for input(s): LIPASE, AMYLASE in the last 168 hours. No results for input(s): AMMONIA in the last 168 hours. CBC: Recent Labs  Lab  08/08/18 0647 08/09/18 0355  WBC 8.4 6.8  NEUTROABS 5.2  --   HGB 14.1 13.0  HCT 43.5 41.2  MCV 89.0 90.0  PLT 213 233   Cardiac Enzymes:   No results for input(s): CKTOTAL, CKMB, CKMBINDEX, TROPONINI in the last 168 hours. BNP (last 3 results) No results for input(s): BNP in the last 8760 hours.  ProBNP (last 3 results) No results for input(s): PROBNP in the last 8760 hours.  CBG: No results for input(s): GLUCAP in the last 168 hours.  No results found for this or any previous visit (from the past 240 hour(s)).   Studies: Mr Cervical Spine W Wo Contrast  Result Date: 104-12-202019 CLINICAL DATA:  Back pain.  Metastatic breast cancer. EXAM:  MRI CERVICAL SPINE WITHOUT AND WITH CONTRAST TECHNIQUE: Multiplanar and multiecho pulse sequences of the cervical spine, to include the craniocervical junction and cervicothoracic junction, were obtained without and with intravenous contrast. CONTRAST:  6 cc Gadavist. COMPARISON:  None. FINDINGS: Alignment: Physiologic. Vertebrae: There is metastatic disease involving the entire cervical spine and the visualized portion of the upper thoracic spine. There are pathologic fractures of C4 and T1 and T3. The tumor involves in the vertebral bodies as well as the posterior elements at multiple levels. There is abnormal dural enhancement throughout the visualized portion of the spine after contrast administration consistent with dural extension of tumor. The patient is at school significant risk for further pathologic fractures. Cord: There is no evidence of metastatic disease to the cervical spinal cord. The AP dimension of the spinal canal is narrowed from C3-4 through C6-7 by bulging discs and by posterior protrusion of the C4 vertebral body into the spinal canal. However, there is no myelopathy. Posterior Fossa, vertebral arteries, paraspinal tissues: No discrete abnormality of the paraspinal soft tissues. Disc levels: C2-3: No disc protrusion or bulging. Multiple areas of tumor within the L2 vertebra. C3-4: No disc protrusion. Tumor infiltrates C3 and C4 with a pathologic fracture of C4 with slight protrusion of the posterior margin of the C4 vertebral body into the spinal canal. Epidural enhancement consistent with tumor. C4-5: No disc protrusion. Tumor involves both vertebra. Epidural enhancement. C5-6: Disc space narrowing. Small broad-based disc bulge with accompanying osteophytes without neural impingement. Epidural enhancement. C6-7: Diffuse tumor in the bones. No disc protrusion. Epidural enhancement. C7-T1: Diffuse infiltration of the vertebral bodies and posterior elements by tumor. Pathologic fracture of T1  with slight protrusion of the posterior margin of T1 into the spinal canal without spinal cord impingement. Abnormal epidural enhancement. EXAM: MRI THORACIC WITHOUT AND WITH CONTRAST TECHNIQUE: Multiplanar and multiecho pulse sequences of the thoracic spine were obtained without and with intravenous contrast. CONTRAST:  6 mL Gadavist COMPARISON:  None.  CT scan of the chest dated 08/08/2018 FINDINGS: MRI THORACIC SPINE FINDINGS Alignment:  Physiologic. Vertebrae: Extensive metastatic disease involving the entire visualized portion of the spine including the vertebral bodies and posterior elements. Pathologic fractures of T1 and T3 and of the superior endplate of T5. Cord:  Normal.  Tip of the conus is at L1-2. Paraspinal and other soft tissues: Negative. Disc levels: T1-2: There is a pathologic compression fracture of T1. The posterior margin of the vertebral body extends slightly into the spinal canal without spinal cord compression. T4-5: Prominent tumor in the left pedicle and lamina of T5 slightly indents the thecal sac. Tumor in the lamina of T7 slightly compresses the posterior aspect of the thecal sac. The other levels in the thoracic spine demonstrate  no significant impingement upon the thecal sac or spinal cord. Tumor in the right side of the vertebral body of L2 and in the right pedicle and posterior elements does compress the thecal sac. There is slight epidural enhancement in the upper and midthoracic spine consistent with tumor. IMPRESSION: 1. 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 The patient is at substantial risk for more pathologic fractures. 2. No discrete spinal cord compression at this time 3. Epidural enhancement after contrast administration in the cervical and thoracic spine consistent with tumor. Electronically Signed   By: Lorriane Shire M.D.   On: 12020/11/1417 17:47   Mr Thoracic Spine W Wo Contrast  Result Date:  12020/11/1417 CLINICAL DATA:  Back pain.  Metastatic breast cancer. EXAM: MRI CERVICAL SPINE WITHOUT AND WITH CONTRAST TECHNIQUE: Multiplanar and multiecho pulse sequences of the cervical spine, to include the craniocervical junction and cervicothoracic junction, were obtained without and with intravenous contrast. CONTRAST:  6 cc Gadavist. COMPARISON:  None. FINDINGS: Alignment: Physiologic. Vertebrae: There is metastatic disease involving the entire cervical spine and the visualized portion of the upper thoracic spine. There are pathologic fractures of C4 and T1 and T3. The tumor involves in the vertebral bodies as well as the posterior elements at multiple levels. There is abnormal dural enhancement throughout the visualized portion of the spine after contrast administration consistent with dural extension of tumor. The patient is at school significant risk for further pathologic fractures. Cord: There is no evidence of metastatic disease to the cervical spinal cord. The AP dimension of the spinal canal is narrowed from C3-4 through C6-7 by bulging discs and by posterior protrusion of the C4 vertebral body into the spinal canal. However, there is no myelopathy. Posterior Fossa, vertebral arteries, paraspinal tissues: No discrete abnormality of the paraspinal soft tissues. Disc levels: C2-3: No disc protrusion or bulging. Multiple areas of tumor within the L2 vertebra. C3-4: No disc protrusion. Tumor infiltrates C3 and C4 with a pathologic fracture of C4 with slight protrusion of the posterior margin of the C4 vertebral body into the spinal canal. Epidural enhancement consistent with tumor. C4-5: No disc protrusion. Tumor involves both vertebra. Epidural enhancement. C5-6: Disc space narrowing. Small broad-based disc bulge with accompanying osteophytes without neural impingement. Epidural enhancement. C6-7: Diffuse tumor in the bones. No disc protrusion. Epidural enhancement. C7-T1: Diffuse infiltration of the  vertebral bodies and posterior elements by tumor. Pathologic fracture of T1 with slight protrusion of the posterior margin of T1 into the spinal canal without spinal cord impingement. Abnormal epidural enhancement. EXAM: MRI THORACIC WITHOUT AND WITH CONTRAST TECHNIQUE: Multiplanar and multiecho pulse sequences of the thoracic spine were obtained without and with intravenous contrast. CONTRAST:  6 mL Gadavist COMPARISON:  None.  CT scan of the chest dated 08/08/2018 FINDINGS: MRI THORACIC SPINE FINDINGS Alignment:  Physiologic. Vertebrae: Extensive metastatic disease involving the entire visualized portion of the spine including the vertebral bodies and posterior elements. Pathologic fractures of T1 and T3 and of the superior endplate of T5. Cord:  Normal.  Tip of the conus is at L1-2. Paraspinal and other soft tissues: Negative. Disc levels: T1-2: There is a pathologic compression fracture of T1. The posterior margin of the vertebral body extends slightly into the spinal canal without spinal cord compression. T4-5: Prominent tumor in the left pedicle and lamina of T5 slightly indents the thecal sac. Tumor in the lamina of T7 slightly compresses the posterior aspect of the thecal sac. The other  levels in the thoracic spine demonstrate no significant impingement upon the thecal sac or spinal cord. Tumor in the right side of the vertebral body of L2 and in the right pedicle and posterior elements does compress the thecal sac. There is slight epidural enhancement in the upper and midthoracic spine consistent with tumor. IMPRESSION: 1. 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 The patient is at substantial risk for more pathologic fractures. 2. No discrete spinal cord compression at this time 3. Epidural enhancement after contrast administration in the cervical and thoracic spine consistent with tumor. Electronically Signed   By: Lorriane Shire M.D.    On: 1Oct 14, 202019 17:47    Scheduled Meds: . dexamethasone  8 mg Oral Q8H  . famotidine  20 mg Oral BID  . feeding supplement (ENSURE ENLIVE)  237 mL Oral BID BM  . [START ON 08/12/2018] Influenza vac split quadrivalent PF  0.5 mL Intramuscular Tomorrow-1000  . ketorolac  30 mg Intravenous Q6H  . lidocaine  1 patch Transdermal Q24H  . methocarbamol  500 mg Oral TID  . polyethylene glycol  17 g Oral Daily  . senna-docusate  1 tablet Oral BID    Continuous Infusions: . sodium chloride 1,000 mL (08/11/18 1111)     Time spent: 46mins I have personally reviewed and interpreted on  08/11/2018 daily labs,  imagings as discussed above under date review session and assessment and plans.  I reviewed all nursing notes, pharmacy notes, consultant notes,  vitals, pertinent old records  I have discussed plan of care as described above with RN , patient  on 08/11/2018   Florencia Reasons MD, PhD  [facility] Pager [phone]. If 7PM-7AM, please contact night-coverage at [email or web address], password [facility] 08/11/2018, 1:59 PM  LOS: 0 days

## 2018-08-12 ENCOUNTER — Ambulatory Visit
Admit: 2018-08-12 | Discharge: 2018-08-12 | Disposition: A | Discharge status: Home / Self Care | Payer: Self-pay | Attending: Radiation Oncology | Admitting: Radiation Oncology

## 2018-08-12 DIAGNOSIS — M5441 Lumbago with sciatica, right side: Secondary | ICD-10-CM

## 2018-08-12 MED ORDER — LIDOCAINE 5 % EX PTCH
1.0000 | MEDICATED_PATCH | CUTANEOUS | Status: DC
  Administered 2018-08-12 – 2018-08-17 (×6): 1 via TRANSDERMAL
  Filled 2018-08-12 (×8): qty 1

## 2018-08-12 MED ORDER — BISACODYL 10 MG RE SUPP
10.0000 mg | Freq: Every day | RECTAL | Status: AC
  Administered 2018-08-12: 10 mg via RECTAL
  Filled 2018-08-12 (×2): qty 1

## 2018-08-12 MED ORDER — BISACODYL 10 MG RE SUPP: 10.0000 mg | Freq: Every day | RECTAL | Status: DC | PRN

## 2018-08-12 NOTE — Progress Notes (Addendum)
Kristina Mcfarland   HKF:[birth year]   LK#:[id number]   JQD#:[id number]  Oncology follow up   Subjective: Pt reports severe constipation, has not had BM since last Saturday. Back pain is still severe, on oral oxycodone, and IV Dilaudid as needed, and lidocaine patch. She is still emotional when we discussed about her cancer diagnosis, likely needs SNF placement when ready, started radiation today.    Objective:  Vitals:   08/12/18 0422 08/12/18 1408  BP: (!) 154/88 (!) 155/94  Pulse: (!) 58 82  Resp: 16 16  Temp: 97.7 F (36.5 C) 97.8 F (36.6 C)  SpO2: 96% 98%    Body mass index is 21.95 kg/m.  Intake/Output Summary (Last 24 hours) at 08/12/2018 2058 Last data filed at 08/12/2018 0940 Gross per 24 hour  Intake 913.97 ml  Output 900 ml  Net 13.97 ml     Sclerae unicteric  No peripheral adenopathy  Lungs clear -- no rales or rhonchi  Heart regular rate and rhythm  Abdomen benign  MSK no focal spinal tenderness, no peripheral edema  Neuro nonfocal   CBG (last 3)  No results for input(s): GLUCAP in the last 72 hours.   Labs:  Lab Results  Component Value Date   WBC 6.8 08/09/2018   HGB 13.0 08/09/2018   HCT 41.2 08/09/2018   MCV 90.0 08/09/2018   PLT 233 08/09/2018   NEUTROABS 5.2 08/08/2018   CMP Latest Ref Rng & Units 08/09/2018 08/08/2018  Glucose 70 - 99 mg/dL 166(H) 105(H)  BUN 8 - 23 mg/dL 20 18  Creatinine 0.44 - 1.00 mg/dL 0.60 0.63  Sodium 135 - 145 mmol/L 140 140  Potassium 3.5 - 5.1 mmol/L 4.4 3.5  Chloride 98 - 111 mmol/L 108 108  CO2 22 - 32 mmol/L 21(L) 20(L)  Calcium 8.9 - 10.3 mg/dL 9.4 10.1  Total Protein 6.5 - 8.1 g/dL 6.3(L) 6.8  Total Bilirubin 0.3 - 1.2 mg/dL 0.5 0.5  Alkaline Phos 38 - 126 U/L 123 134(H)  AST 15 - 41 U/L 54(H) 54(H)  ALT 0 - 44 U/L 51(H) 50(H)  \  Urine Studies No results for input(s): UHGB, CRYS in the last 72 hours.  Invalid input(s): UACOL, UAPR, USPG, UPH, UTP, UGL, UKET, UBIL, UNIT, UROB, ULEU, UEPI, UWBC, URBC, UBAC,  CAST, UCOM, [place]  Basic Metabolic Panel: Recent Labs  Lab 08/08/18 0647 08/08/18 0700 08/09/18 0355  NA 140  --  140  K 3.5  --  4.4  CL 108  --  108  CO2 20*  --  21*  GLUCOSE 105*  --  166*  BUN 18  --  20  CREATININE 0.63  --  0.60  CALCIUM 10.1  --  9.4  MG  --  2.3  --   PHOS  --  4.9*  --    GFR Estimated Creatinine Clearance: 57.7 mL/min (by C-G formula based on SCr of 0.6 mg/dL). Liver Function Tests: Recent Labs  Lab 08/08/18 0647 08/09/18 0355  AST 54* 54*  ALT 50* 51*  ALKPHOS 134* 123  BILITOT 0.5 0.5  PROT 6.8 6.3*  ALBUMIN 4.1 3.4*   No results for input(s): LIPASE, AMYLASE in the last 168 hours. No results for input(s): AMMONIA in the last 168 hours. Coagulation profile Recent Labs  Lab 08/08/18 1632  INR 1.06    CBC: Recent Labs  Lab 08/08/18 0647 08/09/18 0355  WBC 8.4 6.8  NEUTROABS 5.2  --   HGB 14.1 13.0  HCT 43.5 41.2  MCV 89.0 90.0  PLT 213 233   Cardiac Enzymes: No results for input(s): CKTOTAL, CKMB, CKMBINDEX, TROPONINI in the last 168 hours. BNP: Invalid input(s): POCBNP CBG: No results for input(s): GLUCAP in the last 168 hours. D-Dimer No results for input(s): DDIMER in the last 72 hours. Hgb A1c No results for input(s): HGBA1C in the last 72 hours. Lipid Profile No results for input(s): CHOL, HDL, LDLCALC, TRIG, CHOLHDL, LDLDIRECT in the last 72 hours. Thyroid function studies No results for input(s): TSH, T4TOTAL, T3FREE, THYROIDAB in the last 72 hours.  Invalid input(s): FREET3 Anemia work up No results for input(s): VITAMINB12, FOLATE, FERRITIN, TIBC, IRON, RETICCTPCT in the last 72 hours. Microbiology No results found for this or any previous visit (from the past 240 hour(s)).    Studies:  No results found.  Assessment: 62 y.o.  postmenopausal Caucasian female, with past medical history of SVT, palpable left breast mass 4 months ago, presented with severe low back and bilateral hip pain  1. 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 Left breast mass and axillary, [place] adenopathy, likely malignant  3.Severe back pain and low extremity weakness, secondary to #1 4. History of SVT   Plan:  -I spoke with pathologist Kristina Mcfarland this morning, her bone biopsy showed metastatic carcinoma, favor to be adenocarcinoma. IHC studies will be ordered and done tonight, the final pathology diagnosis will likely be available tomorrow. ER/PR/HER2 was be tested if it's breast primary. 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  -I will be out of office for the rest of this week, will follow-up when I return next Monday.  Please call my on-call partner's if needed this week. -PT recommended SNF placement, given her home situation, I think SNF is a better option for her to do PT  -please consult SW for her insurance application  -I discussed with Dr. Freida Busman, MD 08/12/2018

## 2018-08-12 NOTE — Evaluation (Addendum)
Physical Therapy Evaluation Patient Details Name: Kristina Mcfarland MRN: [id number] DOB: [birth year] Today's Date: 08/12/2018   History of Present Illness  Pt is a 62 yo female with diagnosis of breast cancer stage IV admitted to ED on 12/26 for LBP with R leg radiations x 1 month. Pt also with L arm numbness. Imaging revealed cervical and thoracic spine pathologic fractures, and lesions to spine. Other notable PMH includes SVT, back surgery.    Clinical Impression   Pt presents with pain along spine, bilateral hips R>L, and L breast/ribs; increased time and use of environment to perform bed mobility; LE weakness; generalized deconditioning; and decreased tolerance for activity at this time.  Pt to benefit from acute PT to address deficits. Pt refused transfer or attempted ambulation, but performed rolling and sidelying to sit with supervision for safety. Pt requires very increased time to perform mobility tasks, and requires assist to move anti-gravity. Pt very emotional during session today, becoming tearful x3 over diagnosis of cancer and home situation. PT recommending SNF placement at this time due to severe pain and mobility deficits. PT to progress mobility as tolerated, and will continue to follow acutely.      Follow Up Recommendations SNF    Equipment Recommendations  None recommended by PT;Other (comment)(defer to next setting )    Recommendations for Other Services       Precautions / Restrictions Precautions Precautions: Fall;Back Precaution Comments: Mets and pathologic fractues to C- and T-spine, log rolling technique utilized Restrictions Weight Bearing Restrictions: No      Mobility  Bed Mobility Overal bed mobility: Needs Assistance Bed Mobility: Rolling;Sidelying to Sit;Sit to Supine Rolling: Supervision Sidelying to sit: Supervision   Sit to supine: Min assist;HOB elevated   General bed mobility comments: Pt supervision for safety for rolling and sidelying to sit.  Pt with use of bedrails and increased time. Pt required min verbal cuing for rolling technique for spine protection, pt with experience with spinal dysfunction and utilizes technique regularly she states. Pt with min assist to return to supine for LE lifting to avoid hip, back and LE discomfort. Pt sat EOB ~5 minutes with PT, defers standing with RW today due to pain and fatigue.  Transfers Overall transfer level: (NT )                  Ambulation/Gait Ambulation/Gait assistance: (NT )              Stairs            Wheelchair Mobility    Modified Rankin (Stroke Patients Only)       Balance Overall balance assessment: Needs assistance Sitting-balance support: Bilateral upper extremity supported Sitting balance-Leahy Scale: Fair Sitting balance - Comments: unable to accept challenge    Standing balance support: (NT )                                 Pertinent Vitals/Pain Pain Assessment: 0-10 Pain Score: 10-Worst pain ever Pain Location: Pt states her pain is "15-20/10", in her back, L breast, and bilateral anterior hips  Pain Descriptors / Indicators: Sore;Aching;Constant Pain Intervention(s): Limited activity within patient's tolerance;Monitored during session;Repositioned    Home Living Family/patient expects to be discharged to:: Private residence Living Arrangements: Spouse/significant other;Children;Other relatives(Pt lives with 80 year old son, father in law who she cares for, and husband who is "an alcoholic") Available Help at Discharge: Family;Available PRN/intermittently Type of Home: Apartment  Home Access: Stairs to enter   Entrance Stairs-Number of Steps: 2 Home Layout: One level Home Equipment: Walker - 2 wheels;Grab bars - tub/shower      Prior Function Level of Independence: Needs assistance   Gait / Transfers Assistance Needed: Pt reports ambulating very short distances with RW in the home.   ADL's / Homemaking  Assistance Needed: Pt reports doing and less and less cooking/cleaning/other ADLs, and has great difficulty getting into and out of her bathtub. Pt states she uses a children's plastic chair to sit on while bathing, and her son installed a grab bar for her.   Comments: Pt tearful about home situation, stating her husband is an alcoholic and she was going to leave him but then she got sick, and does not want to leave her 41 year old father-in-law unattended. Pt states she wants her father-in-law to live elsewhere but he refuses to leave.     Hand Dominance   Dominant Hand: Right    Extremity/Trunk Assessment        Lower Extremity Assessment Lower Extremity Assessment: RLE deficits/detail;LLE deficits/detail RLE Deficits / Details: Pt reporting sciactic pain down RLE to ankle. MMT screen - 3/5: ankle DF/PF, knee extension. <3/5: hip flexion (only noticable contraction and pt uses UEs to perform hip flexion in supine); hip abduction and adduction not tested due to pt's high level of pain   RLE: Unable to fully assess due to pain LLE Deficits / Details: MMT screen - 3/5: ankle DF/PF, knee extension. <3/5: hip flexion (only noticable contraction and pt uses UEs to perform hip flexion in supine); hip abduction and adduction not tested due to pt's high level of pain   LLE: Unable to fully assess due to pain    Cervical / Trunk Assessment Cervical / Trunk Assessment: Normal  Communication   Communication: No difficulties  Cognition Arousal/Alertness: Awake/alert Behavior During Therapy: Anxious Overall Cognitive Status: Within Functional Limits for tasks assessed                                 General Comments: Pt very emotional during eval, crying x3 when discussing prognosis of diagnosis and her family. Pt states her radiation experience today was negative and she felt like she was being placed in a coffin.       General Comments      Exercises Total Joint  Exercises Ankle Circles/Pumps: AROM;Both;5 reps;Seated Quad Sets: AROM;Both;5 reps;Seated   Assessment/Plan    PT Assessment Patient needs continued PT services  PT Problem List Decreased strength;Pain;Decreased activity tolerance;Decreased knowledge of use of DME;Decreased balance;Decreased safety awareness;Decreased mobility       PT Treatment Interventions DME instruction;Therapeutic activities;Gait training;Therapeutic exercise;Patient/family education;Balance training;Functional mobility training    PT Goals (Current goals can be found in the Care Plan section)  Acute Rehab PT Goals Patient Stated Goal: decrease pain  PT Goal Formulation: With patient Time For Goal Achievement: 08/26/18 Potential to Achieve Goals: Fair    Frequency Min 2X/week   Barriers to discharge Decreased caregiver support      Co-evaluation               AM-PAC PT "6 Clicks" Mobility  Outcome Measure Help needed turning from your back to your side while in a flat bed without using bedrails?: A Lot Help needed moving from lying on your back to sitting on the side of a flat bed without using bedrails?: A Lot Help  needed moving to and from a bed to a chair (including a wheelchair)?: A Lot Help needed standing up from a chair using your arms (e.g., wheelchair or bedside chair)?: A Lot Help needed to walk in hospital room?: A Lot Help needed climbing 3-5 steps with a railing? : A Lot 6 Click Score: 12    End of Session   Activity Tolerance: Patient limited by pain;Patient limited by fatigue Patient left: in bed;with bed alarm set;with call bell/phone within reach Nurse Communication: Mobility status;Patient requests pain meds PT Visit Diagnosis: Other abnormalities of gait and mobility (R26.89);Muscle weakness (generalized) (M62.81)    Time: 3567-0141 PT Time Calculation (min) (ACUTE ONLY): 53 min   Charges:   PT Evaluation $PT Eval Low Complexity: 1 Low PT Treatments $Therapeutic  Activity: 8-22 mins        Dereka Lueras Conception Chancy, PT Acute Rehabilitation Services Pager [phone]  Office [phone]   Faydra Korman D Elonda Husky 08/12/2018, 7:54 PM

## 2018-08-12 NOTE — Progress Notes (Signed)
PMT progress note  The patient is sitting up in bed.     Ongoing generalized pain,as well as back pain No bowel movement noted on chart review.    There is no family at the bedside.   BP (!) 154/88 (BP Location: Right Arm)   Pulse (!) 58   Temp 97.7 F (36.5 C) (Oral)   Resp 16   Ht 5\' 2"  (1.575 m)   Wt 54.4 kg   SpO2 96%   BMI 21.95 kg/m  Labs and imaging noted Chart reviewed  PPS 40%  Biopsy pending Rad onc following, appreciate their input  Pain medication needs noted, continue heating pad, Lidoderm patch and monitor. Continue current opioids.   Sitting up in bed Avoids physical exam Appears weak Breathing ok No edema  Stage IV metastatic breast cancer S/p biopsy, path results pending Seen by rad onc simulation done Continue opioids for cancer related pain Continue bowel regimen, add Dulcolax suppository and monitor.  Will need SNF rehab with palliative care to follow on discharge.   15 minutes spent Loistine Chance MD [facility] health palliative medicine team [phone]

## 2018-08-12 NOTE — Progress Notes (Signed)
PROGRESS NOTE  Kristina Mcfarland EQA:[id number] DOB: 1956-06-07 DOA: 08/08/2018 PCP: Willeen Niece, PA  HPI/Recap of past 24 hours:  Still has pain (lower back, pelvic region) with movement,  Reports lidocaine patch helped no fever Reports still  being constipated Poor oral intake, on iv hydration, she prefers to stay on ivf today Emotional when talking about her diagnosis at times  Assessment/Plan: Principal Problem:   Breast cancer metastasized to bone [facility]) Active Problems:   Paroxysmal SVT (supraventricular tachycardia) (HCC)   LFT elevation   Back pain   Palliative care by specialist   Goals of care, counseling/discussion   Cancer associated pain   Constipation    1.  Stage IV metastatic breast cancer. Extensive metastatic disease based on the CT and MRI. Diffuse metastatic disease involving the entire cervical and thoracic spine with multiple pathologic fractures, Epidural enhancement after contrast administration in the cervical and thoracic spine consistent with tumor IR was consulted for biopsy of the lesions, biopsy done on 12/27, path result pending On dexamthasone, toradol, lidocaine patch, robaxin, prn dilaudid Start xrt treatment Oncology /rad onc consulted, input appreciated   2.  Cancer-related pain. Reports severe pain in her back, pelvic region Will adjust pain medication regimen and monitor. Palliative care is also consulted for pain management. Appreciate their assistance.  3.  History of SVT. Monitor for now.  4.  Abnormal LFT. Monitor for now. Secondary to static disease.  Constipation: stool softener  FTT: pain control,  PT eval, likely will need SNF once pain is better controlled   Code Status: full  Family Communication: patient   Disposition Plan: pain control, PT eval, likely will need snf placement   Consultants:  Oncology  Rad onc  IR  Palliative care  Procedures:  Biopsy left sacral bone lesion on 12/27 by  IR   Antibiotics:  none   Objective: BP (!) 154/88 (BP Location: Right Arm)   Pulse (!) 58   Temp 97.7 F (36.5 C) (Oral)   Resp 16   Ht 5\' 2"  (1.575 m)   Wt 54.4 kg   SpO2 96%   BMI 21.95 kg/m   Intake/Output Summary (Last 24 hours) at 08/12/2018 1359 Last data filed at 08/12/2018 0940 Gross per 24 hour  Intake 1438.93 ml  Output 1360 ml  Net 78.93 ml   Filed Weights   08/08/18 0613 08/08/18 1404  Weight: 54.4 kg 54.4 kg    Exam: Patient is examined daily including today on 08/12/2018, exams remain the same as of yesterday except that has changed    General:  NAD  Cardiovascular: RRR  Respiratory: CTABL  Abdomen: Soft/ND/NT, positive BS  Musculoskeletal: No Edema  Neuro: alert, oriented   Data Reviewed: Basic Metabolic Panel: Recent Labs  Lab 08/08/18 0647 08/08/18 0700 08/09/18 0355  NA 140  --  140  K 3.5  --  4.4  CL 108  --  108  CO2 20*  --  21*  GLUCOSE 105*  --  166*  BUN 18  --  20  CREATININE 0.63  --  0.60  CALCIUM 10.1  --  9.4  MG  --  2.3  --   PHOS  --  4.9*  --    Liver Function Tests: Recent Labs  Lab 08/08/18 0647 08/09/18 0355  AST 54* 54*  ALT 50* 51*  ALKPHOS 134* 123  BILITOT 0.5 0.5  PROT 6.8 6.3*  ALBUMIN 4.1 3.4*   No results for input(s): LIPASE, AMYLASE in the  last 168 hours. No results for input(s): AMMONIA in the last 168 hours. CBC: Recent Labs  Lab 08/08/18 0647 08/09/18 0355  WBC 8.4 6.8  NEUTROABS 5.2  --   HGB 14.1 13.0  HCT 43.5 41.2  MCV 89.0 90.0  PLT 213 233   Cardiac Enzymes:   No results for input(s): CKTOTAL, CKMB, CKMBINDEX, TROPONINI in the last 168 hours. BNP (last 3 results) No results for input(s): BNP in the last 8760 hours.  ProBNP (last 3 results) No results for input(s): PROBNP in the last 8760 hours.  CBG: No results for input(s): GLUCAP in the last 168 hours.  No results found for this or any previous visit (from the past 240 hour(s)).   Studies: No results  found.  Scheduled Meds: . dexamethasone  8 mg Oral Q8H  . famotidine  20 mg Oral BID  . feeding supplement (ENSURE ENLIVE)  237 mL Oral BID BM  . ketorolac  30 mg Intravenous Q6H  . lidocaine  1 patch Transdermal Q24H  . methocarbamol  500 mg Oral TID  . polyethylene glycol  17 g Oral BID  . senna-docusate  1 tablet Oral BID    Continuous Infusions: . sodium chloride 75 mL/hr at 08/12/18 1206     Time spent: 95mins, case discussed with oncology Dr Burr Medico over the phone on 12/30 I have personally reviewed and interpreted on  08/12/2018 daily labs,  imagings as discussed above under date review session and assessment and plans.  I reviewed all nursing notes, pharmacy notes, consultant notes,  vitals, pertinent old records  I have discussed plan of care as described above with RN , patient  on 08/12/2018   Florencia Reasons MD, PhD  [facility] Pager [phone]. If 7PM-7AM, please contact night-coverage at [email or web address], password [facility] 08/12/2018, 1:59 PM  LOS: 1 day

## 2018-08-13 ENCOUNTER — Ambulatory Visit
Admit: 2018-08-13 | Discharge: 2018-08-13 | Disposition: A | Discharge status: Home / Self Care | Payer: Self-pay | Attending: Radiation Oncology | Admitting: Radiation Oncology

## 2018-08-13 MED ORDER — GABAPENTIN 100 MG PO CAPS
100.0000 mg | ORAL_CAPSULE | Freq: Every day | ORAL | Status: DC
  Administered 2018-08-13 – 2018-08-17 (×5): 100 mg via ORAL
  Filled 2018-08-13 (×5): qty 1

## 2018-08-13 MED ORDER — OXYCODONE HCL 5 MG PO TABS
10.0000 mg | ORAL_TABLET | ORAL | Status: DC | PRN
  Administered 2018-08-14 (×2): 10 mg via ORAL
  Filled 2018-08-13 (×3): qty 2

## 2018-08-13 NOTE — Clinical Social Work Note (Signed)
Clinical Social Work Assessment  Patient Details  Name: Kristina Mcfarland MRN: [id number] Date of Birth: 06/16/1956  Date of referral:  08/13/18               Reason for consult:  Discharge Planning, Facility Placement                Permission sought to share information with:    Permission granted to share information::     Name::        Agency::     Relationship::     Contact Information:     Housing/Transportation Living arrangements for the past 2 months:  Single Family Home Source of Information:  Patient, Medical Team Patient Interpreter Needed:  None Criminal Activity/Legal Involvement Pertinent to Current Situation/Hospitalization:  No - Comment as needed Significant Relationships:  Adult Children, Spouse, Other Family Members Lives with:  Self, Adult Children Do you feel safe going back to the place where you live?  Yes Need for family participation in patient care:  Yes (Comment)(pt reports family very involved)  Care giving concerns:  Pt admitted from home where she resides with her husband, father in law, and son. Has strong family support system- also has daughter, grandchildren, and mother in area. 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 "Started using my FIL's walker for a while, and started sleeping in the living room instead of the bedroom so I could be closer to the bathroom and help if I needed it. 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 Reports resulting severe pain in her back.    Social Worker assessment / plan: CSW consulted to assist with disposition as SNF is recommended. Met with pt at bedside, pt very engaged and alert/oriented.  Pt explains she resigned from her job in October when her pain  became too much to handle and work, therefore lost her insurance coverage and has applied for [employer].  Pt states she is living on savings in the meantime and has no finances to afford SNF or personal caregivers. Expects to return home at [place] with help of various family members. States, "I want to get therapy to walk again, but I know in the meantime I will need a lot of help." CSW will follow for needs, unsure of disposition plan at this time.  Employment status:  Unemployed Forensic scientist:    PT Recommendations:  [place] / Referral to community resources:     Patient/Family's Response to care:  Pt very appreciative  Patient/Family's Understanding of and Emotional Response to Diagnosis, Current Treatment, and Prognosis:  Pt expressed good understanding of her status and treatment, describing to CSW as charted. Pt was emotionally very well adjusted as evidenced by stating she was hopeful about her progress but also realistic due to having seen both her parents and several other family members being treated for cancer in the past. Also states she is familiar with therapy and rehab goals due to her husband's experience in the past and her own time in therapy after a ruptured disc about 20 years ago.   Emotional Assessment Appearance:  Appears stated age Attitude/Demeanor/Rapport:  Engaged Affect (typically observed):  Pleasant, Hopeful Orientation:  Oriented to Self, Oriented to  Place, Oriented to  Time, Oriented to Situation Alcohol / Substance use:  Not Applicable Psych involvement (Current and /or in the community):  No (Comment)  Discharge Needs  Concerns to be addressed:  Discharge Planning Concerns Readmission within the last 30 days:  No Current discharge risk:  Dependent with Mobility Barriers to Discharge:  Continued Medical Work up   [employer], LCSW 08/13/2018, 2:00 PM  [phone]

## 2018-08-13 NOTE — Progress Notes (Signed)
PROGRESS NOTE  Kristina Mcfarland YQI:[id number] DOB: 11/13/1955 DOA: 08/08/2018 PCP: Willeen Niece, PA  HPI/Recap of past 24 hours:  Had bm this am,  Still has pain (lower back, pelvic region) with movement,  Reports lidocaine patch helped no fever Emotional when talking about her diagnosis at times  Assessment/Plan: Principal Problem:   Breast cancer metastasized to bone [facility]) Active Problems:   Paroxysmal SVT (supraventricular tachycardia) (HCC)   LFT elevation   Back pain   Palliative care by specialist   Goals of care, counseling/discussion   Cancer associated pain   Constipation    1.  Stage IV metastatic breast cancer. Extensive metastatic disease based on the CT and MRI. Diffuse metastatic disease involving the entire cervical and thoracic spine with multiple pathologic fractures, Epidural enhancement after contrast administration in the cervical and thoracic spine consistent with tumor IR was consulted for biopsy of the lesions, biopsy done on 12/27, path result pending On dexamthasone, toradol, lidocaine patch, robaxin, prn dilaudid, start night time neurontin Started xrt treatment Oncology /rad onc consulted, input appreciated   2.  Cancer-related pain. Reports severe pain in her back, pelvic region Will adjust pain medication regimen and monitor. Palliative care is also consulted for pain management. Appreciate their assistance.  3.  History of SVT. Monitor for now.  4.  Abnormal LFT. Monitor for now. Secondary to static disease.  Constipation: stool softener  FTT: pain control,  PT eval, likely will need SNF once pain is better controlled   Code Status: full  Family Communication: patient   Disposition Plan: pain control, PT eval,  snf placement once pain is better controlled   Consultants:  Oncology  Rad onc  IR  Palliative care  Procedures:  Biopsy left sacral bone lesion on 12/27 by IR  Getting radiation treatment     Antibiotics:  none   Objective: BP 139/69 (BP Location: Right Arm)   Pulse (!) 58   Temp 97.7 F (36.5 C) (Oral)   Resp 16   Ht 5\' 2"  (1.575 m)   Wt 54.4 kg   SpO2 99%   BMI 21.95 kg/m   Intake/Output Summary (Last 24 hours) at 08/13/2018 1312 Last data filed at 08/13/2018 1200 Gross per 24 hour  Intake 2685.19 ml  Output 500 ml  Net 2185.19 ml   Filed Weights   08/08/18 0613 08/08/18 1404  Weight: 54.4 kg 54.4 kg    Exam: Patient is examined daily including today on 08/13/2018, exams remain the same as of yesterday except that has changed    General:  NAD  Cardiovascular: RRR  Respiratory: CTABL  Abdomen: Soft/ND/NT, positive BS  Musculoskeletal: No Edema  Neuro: alert, oriented   Data Reviewed: Basic Metabolic Panel: Recent Labs  Lab 08/08/18 0647 08/08/18 0700 08/09/18 0355  NA 140  --  140  K 3.5  --  4.4  CL 108  --  108  CO2 20*  --  21*  GLUCOSE 105*  --  166*  BUN 18  --  20  CREATININE 0.63  --  0.60  CALCIUM 10.1  --  9.4  MG  --  2.3  --   PHOS  --  4.9*  --    Liver Function Tests: Recent Labs  Lab 08/08/18 0647 08/09/18 0355  AST 54* 54*  ALT 50* 51*  ALKPHOS 134* 123  BILITOT 0.5 0.5  PROT 6.8 6.3*  ALBUMIN 4.1 3.4*   No results for input(s): LIPASE, AMYLASE in the last 168  hours. No results for input(s): AMMONIA in the last 168 hours. CBC: Recent Labs  Lab 08/08/18 0647 08/09/18 0355  WBC 8.4 6.8  NEUTROABS 5.2  --   HGB 14.1 13.0  HCT 43.5 41.2  MCV 89.0 90.0  PLT 213 233   Cardiac Enzymes:   No results for input(s): CKTOTAL, CKMB, CKMBINDEX, TROPONINI in the last 168 hours. BNP (last 3 results) No results for input(s): BNP in the last 8760 hours.  ProBNP (last 3 results) No results for input(s): PROBNP in the last 8760 hours.  CBG: No results for input(s): GLUCAP in the last 168 hours.  No results found for this or any previous visit (from the past 240 hour(s)).   Studies: No results  found.  Scheduled Meds: . bisacodyl  10 mg Rectal Daily  . dexamethasone  8 mg Oral Q8H  . famotidine  20 mg Oral BID  . feeding supplement (ENSURE ENLIVE)  237 mL Oral BID BM  . gabapentin  100 mg Oral QHS  . ketorolac  30 mg Intravenous Q6H  . lidocaine  1 patch Transdermal Q24H  . lidocaine  1 patch Transdermal Q24H  . methocarbamol  500 mg Oral TID  . polyethylene glycol  17 g Oral BID  . senna-docusate  1 tablet Oral BID    Continuous Infusions:    Time spent: 76mins, case discussed with oncology Dr Burr Medico over the phone on 12/30 I have personally reviewed and interpreted on  08/13/2018 daily labs,  imagings as discussed above under date review session and assessment and plans.  I reviewed all nursing notes, pharmacy notes, consultant notes,  vitals, pertinent old records  I have discussed plan of care as described above with RN , patient  on 08/13/2018   Florencia Reasons MD, PhD  [facility] Pager [phone]. If 7PM-7AM, please contact night-coverage at [email or web address], password [facility] 08/13/2018, 1:12 PM  LOS: 2 days

## 2018-08-13 NOTE — Progress Notes (Signed)
PMT progress note  The patient is sitting up in bed.  Very talkative, but hard to direct conversation (? from steroids).  She spent a great deal of time reviewing difficulties she has faced while assisting care of other ill family members (particluarly her father and her first husband who was paraplegic).  She continued to refer back to these when I attempted to discuss her own current situation.  She is married and her husband would be Optician, dispensing, however, she reports that she would like either her youngest son or youngest daughter to make decision on her behalf.  She has Advance directive paperwork at bedside but declined for me to ask spiritual care to complete it with her.  Reports ongoing generalized pain,as well as pain in her neck and back.  States that oxycodone does not relieve pain as effectively as dilaudid.    Also reports having continued anxiety that has worsened since her diagnosis.  BP 139/86   Pulse 70   Temp 98.1 F (36.7 C) (Oral)   Resp 16   Ht 5\' 2"  (1.575 m)   Wt 54.4 kg   SpO2 97%   BMI 21.95 kg/m  Labs and imaging noted Chart reviewed  PPS 40% General: Alert, awake, in no acute distress.  Heart: Regular rate and rhythm. No murmur appreciated. Lungs: Good air movement, clear Abdomen: Soft, nontender, nondistended, positive bowel sounds.  Ext: No significant edema Skin: Warm and dry Neuro: Grossly intact, nonfocal.   Stage IV metastatic breast cancer - Final biopsy pending.  She defers any conversation about long term care plan until biopsy returns and she is able to speak with Dr. Annamaria Boots regarding biopsy results. - Rad onc following.  Started on therapy and hopeful this will improve symptoms. - Continue opioids for cancer related pain.  Increased oxycodone to 10mg  with continuation of breakthrough dilaudid for pain not relieved by oral medications. Continue bowel regimen, add Dulcolax suppository and monitor.  Will need SNF rehab with palliative  care to follow on discharge.    Total time: 40 minutes Greater than 50%  of this time was spent counseling and coordinating care related to the above assessment and plan.  Micheline Rough, MD [place] Team [phone]

## 2018-08-14 LAB — CBC WITH DIFFERENTIAL/PLATELET
Abs Immature Granulocytes: 0.32 10*3/uL — ABNORMAL HIGH (ref 0.00–0.07)
BASOS ABS: 0 10*3/uL (ref 0.0–0.1)
Basophils Relative: 0 %
Eosinophils Absolute: 0 10*3/uL (ref 0.0–0.5)
Eosinophils Relative: 0 %
HCT: 44.7 % (ref 36.0–46.0)
Hemoglobin: 14.6 g/dL (ref 12.0–15.0)
Immature Granulocytes: 3 %
Lymphocytes Relative: 4 %
Lymphs Abs: 0.5 10*3/uL — ABNORMAL LOW (ref 0.7–4.0)
MCH: 28.1 pg (ref 26.0–34.0)
MCHC: 32.7 g/dL (ref 30.0–36.0)
MCV: 86.1 fL (ref 80.0–100.0)
Monocytes Absolute: 0.6 10*3/uL (ref 0.1–1.0)
Monocytes Relative: 6 %
NEUTROS ABS: 9.7 10*3/uL — AB (ref 1.7–7.7)
Neutrophils Relative %: 87 %
PLATELETS: 246 10*3/uL (ref 150–400)
RBC: 5.19 MIL/uL — ABNORMAL HIGH (ref 3.87–5.11)
RDW: 15.7 % — ABNORMAL HIGH (ref 11.5–15.5)
WBC: 11.2 10*3/uL — ABNORMAL HIGH (ref 4.0–10.5)
nRBC: 0.2 % (ref 0.0–0.2)

## 2018-08-14 LAB — COMPREHENSIVE METABOLIC PANEL
ALBUMIN: 3.3 g/dL — AB (ref 3.5–5.0)
ALT: 45 U/L — ABNORMAL HIGH (ref 0–44)
AST: 51 U/L — AB (ref 15–41)
Alkaline Phosphatase: 127 U/L — ABNORMAL HIGH (ref 38–126)
Anion gap: 9 (ref 5–15)
BUN: 25 mg/dL — ABNORMAL HIGH (ref 8–23)
CO2: 23 mmol/L (ref 22–32)
Calcium: 8.5 mg/dL — ABNORMAL LOW (ref 8.9–10.3)
Chloride: 104 mmol/L (ref 98–111)
Creatinine, Ser: 0.41 mg/dL — ABNORMAL LOW (ref 0.44–1.00)
GFR calc Af Amer: >60 mL/min (ref >60)
GFR calc non Af Amer: >60 mL/min (ref >60)
Glucose, Bld: 152 mg/dL — ABNORMAL HIGH (ref 70–99)
Potassium: 4.1 mmol/L (ref 3.5–5.1)
Sodium: 136 mmol/L (ref 135–145)
Total Bilirubin: 1.2 mg/dL (ref 0.3–1.2)
Total Protein: 5.9 g/dL — ABNORMAL LOW (ref 6.5–8.1)

## 2018-08-14 LAB — MAGNESIUM: Magnesium: 2.3 mg/dL (ref 1.7–2.4)

## 2018-08-14 NOTE — Progress Notes (Signed)
PMT progress note  Patient in good spirits as grandchildren are visiting at time of encounter.  She continues to report pain, but reports the pain is been better controlled today.  She is had only 1 dose of rescue medication since this morning.  It was the first time she tried  higher dose of oxycodone she reports that is working "okay right now."  BP (!) 153/83 (BP Location: Right Arm)   Pulse 77   Temp 98 F (36.7 C) (Oral)   Resp 16   Ht 5\' 2"  (1.575 m)   Wt 54.4 kg   SpO2 97%   BMI 21.95 kg/m  Labs and imaging noted Chart reviewed  PPS 40% General: Alert, awake, in no acute distress.  Heart: Regular rate and rhythm. No murmur appreciated. Lungs: Good air movement, clear Abdomen: Soft, nontender, nondistended, positive bowel sounds.  Ext: No significant edema Skin: Warm and dry Neuro: Grossly intact, nonfocal.   Stage IV metastatic breast cancer - Final biopsy pending.  She defers any conversation about long term care plan until biopsy returns and she is able to speak with Dr. Annamaria Boots regarding biopsy results. - Rad onc following.  Started on therapy and hopeful this will improve symptoms. - Continue opioids for cancer related pain.  Continue oxycodone 10mg  with breakthrough dilaudid for pain not relieved by oral medications. - Continue bowel regimen.  Reports regular bowel movements today  - Recommend palliative care to follow on discharge.    Total time: 30 minutes Greater than 50%  of this time was spent counseling and coordinating care related to the above assessment and plan.  Micheline Rough, MD [place] Team [phone]

## 2018-08-14 NOTE — Progress Notes (Signed)
Physical Therapy Treatment Patient Details Name: Kristina Mcfarland MRN: [id number] DOB: [birth year] Today's Date: 08/14/2018    History of Present Illness Pt is a 63 yo female with diagnosis of breast cancer stage IV admitted to ED on 12/26 for LBP with R leg radiations x 1 month. Pt also with L arm numbness. Imaging revealed cervical and thoracic spine pathologic fractures, and lesions to spine. Other notable PMH includes SVT, back surgery.      PT Comments    Pt with very increased time to perform all mobility tasks, due to both decreased endurance for activity as well as pt's talkative nature. Pt very difficult to redirect during session, and is emotionally labile due to recent cancer diagnosis, pt with multiple tearful episodes this session. Pt ambulated to and from bathroom during today's session, and deferred further mobility. PT to continue to follow acutely.    Follow Up Recommendations  SNF     Equipment Recommendations  None recommended by PT;Other (comment)(defer to next setting )    Recommendations for Other Services       Precautions / Restrictions Precautions Precautions: Fall;Back Precaution Comments: Mets and pathologic fractues to C- and T-spine, log rolling technique utilized Restrictions Weight Bearing Restrictions: No    Mobility  Bed Mobility Overal bed mobility: Needs Assistance Bed Mobility: Rolling;Sidelying to Sit;Sit to Supine Rolling: Supervision Sidelying to sit: Supervision   Sit to supine: Min assist;HOB elevated   General bed mobility comments: Supervision for safety for rolling and sidelying to sit. Pt with use of bedrails and increased time. Min assist for sit to supine for LE lifting. Pt reporting "spasms" in bilateral LEs that took 1-2 minutes to go away.   Transfers Overall transfer level: Needs assistance Equipment used: Rolling walker (2 wheeled) Transfers: Sit to/from Stand Sit to Stand: Min assist;From elevated surface         General  transfer comment: Min assist for steadying upon standing. Pt wanted to show PT how she does transfers and did not want any PT input. Pt placed RUE on RW crossbar and rested her L forearm on the handle of the RW. PT informed pt that this was unsafe, and she states "I told you it wasn't going to be pretty or textbook". Sit to stand x2, once from bed and once from toilet. Requires increased time to complete.   Ambulation/Gait Ambulation/Gait assistance: Min guard(2x10 ft ) Gait Distance (Feet): 20 Feet Assistive device: Rolling walker (2 wheeled) Gait Pattern/deviations: Step-through pattern;Decreased stance time - right;Decreased weight shift to right;Antalgic;Wide base of support Gait velocity: decr    General Gait Details: Min guard for safety. Pt with wide BOS to transfer and ambulate. Pt with vaulting over RLE due to pain, using UEs to offweight LEs. Pt with increased time to walk to and from bathroom. Pt defers further ambulation in the hallway, stating she was not ready.    Stairs             Wheelchair Mobility    Modified Rankin (Stroke Patients Only)       Balance Overall balance assessment: Needs assistance Sitting-balance support: Bilateral upper extremity supported Sitting balance-Leahy Scale: Fair Sitting balance - Comments: unable to accept challenge    Standing balance support: No upper extremity supported Standing balance-Leahy Scale: Fair Standing balance comment: Pt able to stand in bathroom to adjust hospital gown without UE support  Cognition Arousal/Alertness: Awake/alert Behavior During Therapy: Anxious Overall Cognitive Status: Within Functional Limits for tasks assessed                                 General Comments: Pt with multiple crying episodes this session when talking about her diagnosis and prognosis, expected treatment, and family. Pt very talkative and difficult to redirect to participate  in PT.       Exercises      General Comments        Pertinent Vitals/Pain Pain Assessment: 0-10 Pain Score: 8  Pain Location: bilateral hips and legs R>L Pain Descriptors / Indicators: Aching;Spasm Pain Intervention(s): Limited activity within patient's tolerance;Repositioned;Monitored during session    Home Living                      Prior Function            PT Goals (current goals can now be found in the care plan section) Acute Rehab PT Goals Patient Stated Goal: decrease pain  PT Goal Formulation: With patient Time For Goal Achievement: 08/26/18 Potential to Achieve Goals: Fair Progress towards PT goals: Progressing toward goals    Frequency    Min 2X/week      PT Plan Current plan remains appropriate    Co-evaluation              AM-PAC PT "6 Clicks" Mobility   Outcome Measure  Help needed turning from your back to your side while in a flat bed without using bedrails?: A Little Help needed moving from lying on your back to sitting on the side of a flat bed without using bedrails?: A Lot Help needed moving to and from a bed to a chair (including a wheelchair)?: A Little Help needed standing up from a chair using your arms (e.g., wheelchair or bedside chair)?: A Little Help needed to walk in hospital room?: A Little Help needed climbing 3-5 steps with a railing? : A Lot 6 Click Score: 16    End of Session Equipment Utilized During Treatment: (Pt deferred use of gait belt ) Activity Tolerance: Patient limited by pain;Patient limited by fatigue Patient left: in bed;with bed alarm set;with call bell/phone within reach;with SCD's reapplied;with nursing/sitter in room Nurse Communication: Mobility status;Patient requests pain meds PT Visit Diagnosis: Other abnormalities of gait and mobility (R26.89);Muscle weakness (generalized) (M62.81)     Time: 1445-1540 PT Time Calculation (min) (ACUTE ONLY): 55 min  Charges:  $Gait Training: 8-22  mins $Therapeutic Activity: 8-22 mins                     Julien Girt, PT Acute Rehabilitation Services Pager [phone]  Office [place] 08/14/2018, 5:12 PM

## 2018-08-14 NOTE — Progress Notes (Signed)
Pt ambulating to the bathroom with front wheel walker and standby assist. She refuses to sit up in chair. Family in to visit.

## 2018-08-14 NOTE — Progress Notes (Signed)
[facility] PROGRESS NOTE    Progress Note  Kristina Mcfarland  RKY:[id number] DOB: 11-26-1955 DOA: 08/08/2018 PCP: Willeen Niece, PA     Brief Narrative:   Kristina Mcfarland is an 63 y.o. female past medical history significant for breast cancer diagnosed in August after mammography comes into the emergency room complaining of progressive back pain to her legs particular her right side for 6 weeks, accompanied by left upper extremity numbness and weakness, MRI and CT showed extensive metastatic disease involving both the cervical, thoracic spine with pathological fracture.  Assessment/Plan:   Stage IV breast cancer metastasized to bone [facility]) CT scan and MRI show extensive metastatic disease.  Involving the cervical and thoracic spine. IR was consulted who performed bone biopsy on 08/09/2018.  Bone biopsy showed metastatic carcinoma adeno IH studies have been ordered. Currently on dexamethasone, tramadol lidocaine patch, Robaxin narcotics as needed and Neurontin at bedtime. He is to start radiation treatment. Pain is not controlled continue to titrate narcotics for pain control.    Cancer related pain: Palliative care has been consulted for pain management.    History of paroxysmal SVT (supraventricular tachycardia) (HCC) Stable monitor.  Abnormal LFTs: Secondary to metastatic disease.  Constipation Miralax.  Failure to thrive: PT evaluated the patient recommended skilled nursing facility.  DVT prophylaxis: lovenxo Family Communication:none Disposition Plan/Barrier to D/C: once pain control Code Status:     Code Status Orders  (From admission, onward)         Start     Ordered   08/08/18 1308  Full code  Continuous     08/08/18 1307        Code Status History    This patient has a current code status but no historical code status.        IV Access:    Peripheral IV   Procedures and diagnostic studies:   No results found.   Medical Consultants:     None.  Anti-Infectives:   None  Subjective:    Kristina Mcfarland very labile motion after being told she has stage IV breast cancer.  Objective:    Vitals:   08/13/18 0543 08/13/18 1434 08/13/18 2115 08/14/18 0436  BP: 139/69 139/86 (!) 163/91 123/68  Pulse: (!) 58 70 76 (!) 56  Resp: 16  20 16   Temp: 97.7 F (36.5 C) 98.1 F (36.7 C) 97.9 F (36.6 C) 97.8 F (36.6 C)  TempSrc: Oral Oral Oral Oral  SpO2: 99% 97% 100% 99%  Weight:      Height:        Intake/Output Summary (Last 24 hours) at 08/14/2018 0852 Last data filed at 08/13/2018 1200 Gross per 24 hour  Intake 2565.19 ml  Output 500 ml  Net 2065.19 ml   Filed Weights   08/08/18 0613 08/08/18 1404  Weight: 54.4 kg 54.4 kg    Exam: General exam: In no acute distress. Respiratory system: Good air movement and clear to auscultation. Cardiovascular system: S1 & S2 heard, RRR. Gastrointestinal system: Abdomen is nondistended, soft and nontender.  Central nervous system: Alert and oriented. No focal neurological deficits. Extremities: No pedal edema. Skin: No rashes, lesions or ulcers Psychiatry: Judgement and insight appear normal. Mood & affect appropriate.    Data Reviewed:    Labs: Basic Metabolic Panel: Recent Labs  Lab 08/08/18 0647 08/08/18 0700 08/09/18 0355 08/14/18 0358  NA 140  --  140 136  K 3.5  --  4.4 4.1  CL 108  --  108 104  CO2 20*  --  21* 23  GLUCOSE 105*  --  166* 152*  BUN 18  --  20 25*  CREATININE 0.63  --  0.60 0.41*  CALCIUM 10.1  --  9.4 8.5*  MG  --  2.3  --  2.3  PHOS  --  4.9*  --   --    GFR Estimated Creatinine Clearance: 57.7 mL/min (A) (by C-G formula based on SCr of 0.41 mg/dL (L)). Liver Function Tests: Recent Labs  Lab 08/08/18 0647 08/09/18 0355 08/14/18 0358  AST 54* 54* 51*  ALT 50* 51* 45*  ALKPHOS 134* 123 127*  BILITOT 0.5 0.5 1.2  PROT 6.8 6.3* 5.9*  ALBUMIN 4.1 3.4* 3.3*   No results for input(s): LIPASE, AMYLASE in the last 168  hours. No results for input(s): AMMONIA in the last 168 hours. Coagulation profile Recent Labs  Lab 08/08/18 1632  INR 1.06    CBC: Recent Labs  Lab 08/08/18 0647 08/09/18 0355 08/14/18 0358  WBC 8.4 6.8 11.2*  NEUTROABS 5.2  --  9.7*  HGB 14.1 13.0 14.6  HCT 43.5 41.2 44.7  MCV 89.0 90.0 86.1  PLT 213 233 246   Cardiac Enzymes: No results for input(s): CKTOTAL, CKMB, CKMBINDEX, TROPONINI in the last 168 hours. BNP (last 3 results) No results for input(s): PROBNP in the last 8760 hours. CBG: No results for input(s): GLUCAP in the last 168 hours. D-Dimer: No results for input(s): DDIMER in the last 72 hours. Hgb A1c: No results for input(s): HGBA1C in the last 72 hours. Lipid Profile: No results for input(s): CHOL, HDL, LDLCALC, TRIG, CHOLHDL, LDLDIRECT in the last 72 hours. Thyroid function studies: No results for input(s): TSH, T4TOTAL, T3FREE, THYROIDAB in the last 72 hours.  Invalid input(s): FREET3 Anemia work up: No results for input(s): VITAMINB12, FOLATE, FERRITIN, TIBC, IRON, RETICCTPCT in the last 72 hours. Sepsis Labs: Recent Labs  Lab 08/08/18 0647 08/09/18 0355 08/14/18 0358  WBC 8.4 6.8 11.2*   Microbiology No results found for this or any previous visit (from the past 240 hour(s)).   Medications:   . bisacodyl  10 mg Rectal Daily  . dexamethasone  8 mg Oral Q8H  . famotidine  20 mg Oral BID  . feeding supplement (ENSURE ENLIVE)  237 mL Oral BID BM  . gabapentin  100 mg Oral QHS  . ketorolac  30 mg Intravenous Q6H  . lidocaine  1 patch Transdermal Q24H  . lidocaine  1 patch Transdermal Q24H  . methocarbamol  500 mg Oral TID  . polyethylene glycol  17 g Oral BID  . senna-docusate  1 tablet Oral BID   Continuous Infusions:   LOS: 3 days   Charlynne Cousins  [facility]   *Please refer to [email or web address], password [email or web address] to get updated schedule on who will round on this patient, as hospitalists switch teams weekly. If 7PM-7AM,  please contact night-coverage at [email or web address], password [email or web address] for any overnight needs.  08/14/2018, 8:52 AM

## 2018-08-15 ENCOUNTER — Ambulatory Visit
Admission: RE | Admit: 2018-08-15 | Discharge: 2018-08-15 | Disposition: A | Discharge status: Home / Self Care | Payer: Self-pay | Source: Ambulatory Visit | Attending: Radiation Oncology | Admitting: Radiation Oncology

## 2018-08-15 ENCOUNTER — Ambulatory Visit (HOSPITAL_COMMUNITY): Payer: Self-pay

## 2018-08-15 DIAGNOSIS — C7951 Secondary malignant neoplasm of bone: Secondary | ICD-10-CM | POA: Insufficient documentation

## 2018-08-15 MED ORDER — MORPHINE SULFATE ER 15 MG PO TBCR
15.0000 mg | EXTENDED_RELEASE_TABLET | Freq: Two times a day (BID) | ORAL | Status: DC
  Administered 2018-08-15 – 2018-08-18 (×7): 15 mg via ORAL
  Filled 2018-08-15 (×7): qty 1

## 2018-08-15 MED ORDER — OXYCODONE HCL 5 MG PO TABS
10.0000 mg | ORAL_TABLET | ORAL | Status: DC | PRN
  Administered 2018-08-15 – 2018-08-16 (×3): 15 mg via ORAL
  Filled 2018-08-15 (×3): qty 3

## 2018-08-15 NOTE — Progress Notes (Signed)
PMT progress note  She continues to report pain, but reports pain is now centered around radiation treatments.  BP 117/90 (BP Location: Right Arm)   Pulse 75   Temp 98.4 F (36.9 C) (Oral)   Resp 17   Ht 5\' 2"  (1.575 m)   Wt 54.4 kg   SpO2 98%   BMI 21.95 kg/m  Labs and imaging noted Chart reviewed  PPS 40% General: Alert, awake, in no acute distress.  Heart: Regular rate and rhythm. No murmur appreciated. Lungs: Good air movement, clear Abdomen: Soft, nontender, nondistended, positive bowel sounds.  Ext: No significant edema Skin: Warm and dry Neuro: Grossly intact, nonfocal.   Stage IV metastatic breast cancer - Final biopsy pending.  She defers any conversation about long term care plan until biopsy returns and she is able to speak with Dr. Annamaria Boots regarding biopsy results. - Rad onc following.  Started on therapy and hopeful this will improve symptoms. - Continue opioids for cancer related pain.  Agree with initiation of MS Contin.  Continue oxycodone 10-15mg  with breakthrough dilaudid for pain not relieved by oral medications.  At this point, she is only using IV medication around her radiation treatments. - Continue bowel regimen.  Reports regular bowel movements today  - Recommend palliative care to follow on discharge.    Total time: 20 minutes Greater than 50%  of this time was spent counseling and coordinating care related to the above assessment and plan.  Micheline Rough, MD [place] Team [phone]

## 2018-08-15 NOTE — Progress Notes (Addendum)
[facility] PROGRESS NOTE    Progress Note  Kristina Mcfarland  VPX:[id number] DOB: 1956-07-11 DOA: 08/08/2018 PCP: Willeen Niece, PA     Brief Narrative:   Kristina Mcfarland is an 63 y.o. female past medical history significant for breast cancer diagnosed in August after mammography comes into the emergency room complaining of progressive back pain to her legs particular her right side for 6 weeks, accompanied by left upper extremity numbness and weakness, MRI and CT showed extensive metastatic disease involving both the cervical, thoracic spine with pathological fracture.  Assessment/Plan:   Stage IV breast cancer metastasized to bone [facility]): CT scan and MRI show extensive metastatic disease.  Involving the cervical and thoracic spine. IR was consulted who performed bone biopsy on 08/09/2018.  Bone biopsy showed metastatic carcinoma adeno IH studies have been ordered. Currently on dexamethasone, tramadol lidocaine patch, Robaxin narcotics as needed and Neurontin at bedtime. begining radiation treatment today. She was started on a bowel regimen she has her first bowel movement today. Pain is not controlled continue to titrate narcotics for pain control.    Cancer related pain: Palliative care has been consulted for pain management.    History of paroxysmal SVT (supraventricular tachycardia) (HCC) Stable monitor.  Abnormal LFTs: Secondary to metastatic disease.  Constipation Miralax.  Failure to thrive: PT evaluated the patient recommended skilled nursing facility.  DVT prophylaxis: lovenxo Family Communication:none Disposition Plan/Barrier to D/C: Social worker has been consulted for skilled nursing facility temporary rehab placement. Code Status:     Code Status Orders  (From admission, onward)         Start     Ordered   08/08/18 1308  Full code  Continuous     08/08/18 1307        Code Status History    This patient has a current code status but no historical  code status.        IV Access:    Peripheral IV   Procedures and diagnostic studies:   No results found.   Medical Consultants:    None.  Anti-Infectives:   None  Subjective:    Kristina Mcfarland in a better mood today, she is happy that she had a bowel movement.  Objective:    Vitals:   08/14/18 0436 08/14/18 1237 08/14/18 2211 08/15/18 0557  BP: 123/68 (!) 153/83 (!) 152/90 136/64  Pulse: (!) 56 77 75 71  Resp: 16 16 17 16   Temp: 97.8 F (36.6 C) 98 F (36.7 C) 98.1 F (36.7 C) 98.3 F (36.8 C)  TempSrc: Oral Oral Oral Oral  SpO2: 99% 97% 100% 98%  Weight:      Height:        Intake/Output Summary (Last 24 hours) at 08/15/2018 0823 Last data filed at 08/15/2018 0700 Gross per 24 hour  Intake 1391 ml  Output -  Net 1391 ml   Filed Weights   08/08/18 0613 08/08/18 1404  Weight: 54.4 kg 54.4 kg    Exam: General exam: In no acute distress. Respiratory system: Good air movement and clear to auscultation. Cardiovascular system: S1 & S2 heard, RRR. Gastrointestinal system: Abdomen is nondistended, soft and nontender.  Central nervous system: Alert and oriented. No focal neurological deficits. Extremities: No pedal edema. Skin: Cervical lymphadenopathy she has a very shows lymph node and multiple axillary lymph nodes. Psychiatry: Judgement and insight appear normal. Mood & affect appropriate.    Data Reviewed:    Labs: Basic Metabolic Panel: Recent Labs  Lab 08/09/18 0355  08/14/18 0358  NA 140 136  K 4.4 4.1  CL 108 104  CO2 21* 23  GLUCOSE 166* 152*  BUN 20 25*  CREATININE 0.60 0.41*  CALCIUM 9.4 8.5*  MG  --  2.3   GFR Estimated Creatinine Clearance: 57.7 mL/min (A) (by C-G formula based on SCr of 0.41 mg/dL (L)). Liver Function Tests: Recent Labs  Lab 08/09/18 0355 08/14/18 0358  AST 54* 51*  ALT 51* 45*  ALKPHOS 123 127*  BILITOT 0.5 1.2  PROT 6.3* 5.9*  ALBUMIN 3.4* 3.3*   No results for input(s): LIPASE, AMYLASE in the  last 168 hours. No results for input(s): AMMONIA in the last 168 hours. Coagulation profile Recent Labs  Lab 08/08/18 1632  INR 1.06    CBC: Recent Labs  Lab 08/09/18 0355 08/14/18 0358  WBC 6.8 11.2*  NEUTROABS  --  9.7*  HGB 13.0 14.6  HCT 41.2 44.7  MCV 90.0 86.1  PLT 233 246   Cardiac Enzymes: No results for input(s): CKTOTAL, CKMB, CKMBINDEX, TROPONINI in the last 168 hours. BNP (last 3 results) No results for input(s): PROBNP in the last 8760 hours. CBG: No results for input(s): GLUCAP in the last 168 hours. D-Dimer: No results for input(s): DDIMER in the last 72 hours. Hgb A1c: No results for input(s): HGBA1C in the last 72 hours. Lipid Profile: No results for input(s): CHOL, HDL, LDLCALC, TRIG, CHOLHDL, LDLDIRECT in the last 72 hours. Thyroid function studies: No results for input(s): TSH, T4TOTAL, T3FREE, THYROIDAB in the last 72 hours.  Invalid input(s): FREET3 Anemia work up: No results for input(s): VITAMINB12, FOLATE, FERRITIN, TIBC, IRON, RETICCTPCT in the last 72 hours. Sepsis Labs: Recent Labs  Lab 08/09/18 0355 08/14/18 0358  WBC 6.8 11.2*   Microbiology No results found for this or any previous visit (from the past 240 hour(s)).   Medications:   . dexamethasone  8 mg Oral Q8H  . famotidine  20 mg Oral BID  . feeding supplement (ENSURE ENLIVE)  237 mL Oral BID BM  . gabapentin  100 mg Oral QHS  . lidocaine  1 patch Transdermal Q24H  . lidocaine  1 patch Transdermal Q24H  . methocarbamol  500 mg Oral TID  . polyethylene glycol  17 g Oral BID  . senna-docusate  1 tablet Oral BID   Continuous Infusions:   LOS: 4 days   Charlynne Cousins  [facility]   *Please refer to [email or web address], password [email or web address] to get updated schedule on who will round on this patient, as hospitalists switch teams weekly. If 7PM-7AM, please contact night-coverage at [email or web address], password [email or web address] for any overnight needs.  08/15/2018, 8:23 AM

## 2018-08-16 ENCOUNTER — Ambulatory Visit
Admit: 2018-08-16 | Discharge: 2018-08-16 | Disposition: A | Discharge status: Home / Self Care | Payer: Self-pay | Attending: Radiation Oncology | Admitting: Radiation Oncology

## 2018-08-16 MED ORDER — LORAZEPAM 2 MG/ML IJ SOLN
0.5000 mg | Freq: Four times a day (QID) | INTRAMUSCULAR | Status: DC | PRN
  Administered 2018-08-16: 0.5 mg via INTRAVENOUS
  Filled 2018-08-16: qty 1

## 2018-08-16 MED ORDER — ENSURE ENLIVE PO LIQD
237.0000 mL | ORAL | Status: DC
  Administered 2018-08-17 – 2018-08-18 (×2): 237 mL via ORAL

## 2018-08-16 MED ORDER — ADULT MULTIVITAMIN W/MINERALS CH
1.0000 | ORAL_TABLET | Freq: Every day | ORAL | Status: DC
  Administered 2018-08-16 – 2018-08-18 (×3): 1 via ORAL
  Filled 2018-08-16 (×3): qty 1

## 2018-08-16 NOTE — Progress Notes (Signed)
Nutrition Follow-up  DOCUMENTATION CODES:   Not applicable  INTERVENTION:    Change Ensure Enlive po to once daily, each supplement provides 350 kcal and 20 grams of protein  Add MVI daily  NUTRITION DIAGNOSIS:   Increased nutrient needs related to cancer and cancer related treatments(breast cancer mets to bone) as evidenced by estimated needs, NPO status.  Ongoing  GOAL:   Patient will meet greater than or equal to 90% of their needs  Meeting PO  MONITOR:   Diet advancement, Labs, Weight trends  REASON FOR ASSESSMENT:   Malnutrition Screening Tool    ASSESSMENT:   63 year old patient with medical history significant of breast cancer diagnosed in August after mammogram presented to ED with back and leg pain and LUE numbness x 5-6 weeks. CT shows widespread aggressive bone mets and mass in pelvis   Meal completions charted as 75-100% for pt's last eight meals. Pt endorses having a great appetite this admission. She would like for Ensure to be decreased to once a day so her "BMs stay regular."   Pt states her appetite was good PTA. She recently started having trouble with holding utensils due to her right three fingers going numb, but this did not affect her intake. She has not had any issues this admission.   Pt endorses a 30 lb weight loss from her UBW of 150 lb. Records show pt weighed 158 lb at [facility] on 11/20. Weight reading this admission looks to be stated at 120 lb, suspect this is inaccurate. Will need to obtain actual wt to quantify wt loss. Nutrition-Focused physical exam completed.   Medications reviewed and include: decadron, miralax Labs reviewed: Phosphorus 4.9 (H)   NUTRITION - FOCUSED PHYSICAL EXAM:    Most Recent Value  Orbital Region  No depletion  Upper Arm Region  Mild depletion  Thoracic and Lumbar Region  Unable to assess  Buccal Region  No depletion  Temple Region  No depletion  Clavicle Bone Region  No depletion  Clavicle and Acromion  Bone Region  No depletion  Scapular Bone Region  Unable to assess  Dorsal Hand  No depletion  Patellar Region  No depletion  Anterior Thigh Region  No depletion  Posterior Calf Region  No depletion  Edema (RD Assessment)  None  Hair  Reviewed  Eyes  Reviewed  Mouth  Reviewed  Skin  Reviewed  Nails  Reviewed     Diet Order:   Diet Order            Diet regular Room service appropriate? Yes; Fluid consistency: Thin  Diet effective now              EDUCATION NEEDS:   Not appropriate for education at this time  Skin:  Skin Assessment: Reviewed RN Assessment  Last BM:  08/15/2018  Height:   Ht Readings from Last 1 Encounters:  08/08/18 5\' 2"  (1.575 m)    Weight:   Wt Readings from Last 1 Encounters:  08/08/18 54.4 kg    Ideal Body Weight:  50 kg  BMI:  Body mass index is 21.95 kg/m.  Estimated Nutritional Needs:   Kcal:  1696-7893 (30-35g/kg)  Protein:  70-89g  Fluid:  </=1.9L/day   Mariana Single RD, LDN Clinical Nutrition Pager # [phone]

## 2018-08-16 NOTE — Care Management (Signed)
Per conversation with CSW, pt plans to dc home with husband. Due to no insurance pt will be evaluated for charity care through [facility] for home health services. Will need orders for HHPT/CSW at dc. Marney Doctor RN,BSN [phone]

## 2018-08-16 NOTE — Progress Notes (Signed)
Patient currently refuses oral meds wants to wait due to she wants to make sure she doesn't get nauseated from radiation.

## 2018-08-16 NOTE — Progress Notes (Signed)
Pt and CSW discussed her dc plan at length- pt and her son are thoroughly preparing for her to return home. (Pt has no insurance and prefers not to pursue SNF due to cost barriers.) Pt open to home health if agency able to accommodate charity care case, pt also awaiting result of Medicaid application which she is hoping will help in the future. Pt has good family support from her son and daughter, husband and father-in-law also in the home. Son is working to make home more accessible if pt needs to use wheelchair when she returns home (has Marketing executive at home and father in law is purchasing her a wheelchair).  CSW provided some supportive counseling as pt expressed how worried she is about her cancer diagnosis and her family's various needs (reports cars being repaired, relational stress, etc.), pt tearful and CSW assisted her in processing her emotions around her health and family.   Sharren Bridge, MSW, LCSW Clinical Social Work 08/16/2018 [phone]

## 2018-08-16 NOTE — Progress Notes (Signed)
[facility] PROGRESS NOTE    Progress Note  Kristina Mcfarland  HFG:[id number] DOB: 21-May-1956 DOA: 08/08/2018 PCP: Willeen Niece, PA     Brief Narrative:   Kristina Mcfarland is an 63 y.o. female past medical history significant for breast cancer diagnosed in August after mammography comes into the emergency room complaining of progressive back pain to her legs particular her right side for 6 weeks, accompanied by left upper extremity numbness and weakness, MRI and CT showed extensive metastatic disease involving both the cervical, thoracic spine with pathological fracture.  Assessment/Plan:   Stage IV breast cancer metastasized to bone [facility]): CT scan and MRI show extensive metastatic disease.  Involving the cervical and thoracic spine. IR was consulted who performed bone biopsy on 08/09/2018.  Bone biopsy showed metastatic adenocarcinoma IH studies have been ordered. Currently on dexamethasone, tramadol lidocaine patch, Robaxin narcotics as needed and Neurontin at bedtime. Tolerating her radiation. She was started on a bowel regimen she has her first bowel movement today. Pain is not controlled continue to titrate narcotics for pain control. Therapy evaluated the patient recommended rehab facility.    Cancer related pain: Palliative care has been consulted for pain management.    History of paroxysmal SVT (supraventricular tachycardia) (HCC) Stable monitor.  Abnormal LFTs: Secondary to metastatic disease.  Constipation Miralax.  Failure to thrive: PT evaluated the patient recommended skilled nursing facility.  Leukocytosis: Likely due to Decadron.  She has remained afebrile.  DVT prophylaxis: lovenxo Family Communication:none Disposition Plan/Barrier to D/C: Social worker has been consulted for skilled nursing facility temporary rehab placement on 08/17/2018 Code Status:     Code Status Orders  (From admission, onward)         Start     Ordered   08/08/18 1308  Full  code  Continuous     08/08/18 1307        Code Status History    This patient has a current code status but no historical code status.        IV Access:    Peripheral IV   Procedures and diagnostic studies:   No results found.   Medical Consultants:    None.  Anti-Infectives:   None  Subjective:    Kristina Mcfarland she relates her pain is better controlled today.  Objective:    Vitals:   08/15/18 0557 08/15/18 1313 08/15/18 2122 08/16/18 0410  BP: 136/64 118/74 117/90 121/86  Pulse: 71 86 75 71  Resp: 16 18 17 16   Temp: 98.3 F (36.8 C) 97.8 F (36.6 C) 98.4 F (36.9 C) 98.1 F (36.7 C)  TempSrc: Oral Oral Oral Oral  SpO2: 98% 100% 98% 99%  Weight:      Height:        Intake/Output Summary (Last 24 hours) at 08/16/2018 0842 Last data filed at 08/15/2018 1732 Gross per 24 hour  Intake 720 ml  Output -  Net 720 ml   Filed Weights   08/08/18 0613 08/08/18 1404  Weight: 54.4 kg 54.4 kg    Exam: General exam: In no acute distress. Respiratory system: Good air movement and clear to auscultation. Cardiovascular system: S1 & S2 heard, RRR. Gastrointestinal system: Abdomen is nondistended, soft and nontender.  Central nervous system: Alert and oriented. No focal neurological deficits. Extremities: No pedal edema. Skin: Cervical lymphadenopathy she has a very shows lymph node and multiple axillary lymph nodes. Psychiatry: Judgement and insight appear normal. Mood & affect appropriate.    Data Reviewed:    Labs:  Basic Metabolic Panel: Recent Labs  Lab 08/14/18 0358  NA 136  K 4.1  CL 104  CO2 23  GLUCOSE 152*  BUN 25*  CREATININE 0.41*  CALCIUM 8.5*  MG 2.3   GFR Estimated Creatinine Clearance: 57.7 mL/min (A) (by C-G formula based on SCr of 0.41 mg/dL (L)). Liver Function Tests: Recent Labs  Lab 08/14/18 0358  AST 51*  ALT 45*  ALKPHOS 127*  BILITOT 1.2  PROT 5.9*  ALBUMIN 3.3*   No results for input(s): LIPASE, AMYLASE in the  last 168 hours. No results for input(s): AMMONIA in the last 168 hours. Coagulation profile No results for input(s): INR, PROTIME in the last 168 hours.  CBC: Recent Labs  Lab 08/14/18 0358  WBC 11.2*  NEUTROABS 9.7*  HGB 14.6  HCT 44.7  MCV 86.1  PLT 246   Cardiac Enzymes: No results for input(s): CKTOTAL, CKMB, CKMBINDEX, TROPONINI in the last 168 hours. BNP (last 3 results) No results for input(s): PROBNP in the last 8760 hours. CBG: No results for input(s): GLUCAP in the last 168 hours. D-Dimer: No results for input(s): DDIMER in the last 72 hours. Hgb A1c: No results for input(s): HGBA1C in the last 72 hours. Lipid Profile: No results for input(s): CHOL, HDL, LDLCALC, TRIG, CHOLHDL, LDLDIRECT in the last 72 hours. Thyroid function studies: No results for input(s): TSH, T4TOTAL, T3FREE, THYROIDAB in the last 72 hours.  Invalid input(s): FREET3 Anemia work up: No results for input(s): VITAMINB12, FOLATE, FERRITIN, TIBC, IRON, RETICCTPCT in the last 72 hours. Sepsis Labs: Recent Labs  Lab 08/14/18 0358  WBC 11.2*   Microbiology No results found for this or any previous visit (from the past 240 hour(s)).   Medications:   . dexamethasone  8 mg Oral Q8H  . famotidine  20 mg Oral BID  . feeding supplement (ENSURE ENLIVE)  237 mL Oral BID BM  . gabapentin  100 mg Oral QHS  . lidocaine  1 patch Transdermal Q24H  . lidocaine  1 patch Transdermal Q24H  . methocarbamol  500 mg Oral TID  . morphine  15 mg Oral Q12H  . polyethylene glycol  17 g Oral BID  . senna-docusate  1 tablet Oral BID   Continuous Infusions:   LOS: 5 days   Charlynne Cousins  [facility]   *Please refer to [email or web address], password [email or web address] to get updated schedule on who will round on this patient, as hospitalists switch teams weekly. If 7PM-7AM, please contact night-coverage at [email or web address], password [email or web address] for any overnight needs.  08/16/2018, 8:42 AM

## 2018-08-17 ENCOUNTER — Encounter (HOSPITAL_COMMUNITY): Payer: Self-pay

## 2018-08-17 MED ORDER — OXYCODONE HCL 5 MG PO TABS
15.0000 mg | ORAL_TABLET | ORAL | Status: DC | PRN
  Administered 2018-08-18: 15 mg via ORAL
  Filled 2018-08-17: qty 3

## 2018-08-17 NOTE — Progress Notes (Signed)
[facility] PROGRESS NOTE    Progress Note  Kristina Mcfarland  PXT:[id number] DOB: 28-Apr-1956 DOA: 08/08/2018 PCP: Willeen Niece, PA     Brief Narrative:   Kristina Mcfarland is an 63 y.o. female past medical history significant for breast cancer diagnosed in August after mammography comes into the emergency room complaining of progressive back pain to her legs particular her right side for 6 weeks, accompanied by left upper extremity numbness and weakness, MRI and CT showed extensive metastatic disease involving both the cervical, thoracic spine with pathological fracture.  Assessment/Plan:   Stage IV breast cancer metastasized to bone [facility]): She is having regular bowel movements, her pain is not controlled today.  We will increase her short acting. Pain is not controlled continue to titrate narcotics for pain control. Therapy evaluated the patient recommended rehab facility, but the patient does not have insurance she is applying for Medicaid.    Cancer related pain: Titrate up her short acting narcotics as she relates her pain is not controlled.  History of paroxysmal SVT (supraventricular tachycardia) (HCC) Stable monitor.  Abnormal LFTs: Secondary to metastatic disease.  Constipation Miralax.  Failure to thrive: PT evaluated the patient recommended skilled nursing facility.  Leukocytosis: Likely due to Decadron.  She has remained afebrile.  DVT prophylaxis: lovenxo Family Communication:none Disposition Plan/Barrier to D/C: With home health tomorrow. Code Status:     Code Status Orders  (From admission, onward)         Start     Ordered   08/08/18 1308  Full code  Continuous     08/08/18 1307        Code Status History    This patient has a current code status but no historical code status.        IV Access:    Peripheral IV   Procedures and diagnostic studies:   No results found.   Medical Consultants:    None.  Anti-Infectives:    None  Subjective:    Kristina Mcfarland lates her pain is not better today feels a nauseated, she relates she will try to eat something this morning. Objective:    Vitals:   08/16/18 0410 08/16/18 1507 08/16/18 2211 08/17/18 0604  BP: 121/86 120/78 126/81 120/76  Pulse: 71 79 81 77  Resp: 16 16 17 18   Temp: 98.1 F (36.7 C) 98.2 F (36.8 C) 98.3 F (36.8 C) 98.7 F (37.1 C)  TempSrc: Oral Oral Oral Oral  SpO2: 99% 98% 97% 98%  Weight:      Height:        Intake/Output Summary (Last 24 hours) at 08/17/2018 0821 Last data filed at 08/16/2018 1857 Gross per 24 hour  Intake -  Output 0 ml  Net 0 ml   Filed Weights   08/08/18 0613 08/08/18 1404  Weight: 54.4 kg 54.4 kg    Exam: General exam: In no acute distress. Respiratory system: Good air movement and clear to auscultation. Cardiovascular system: S1 & S2 heard, RRR. Gastrointestinal system: Abdomen is nondistended, soft and nontender.  Central nervous system: Alert and oriented. No focal neurological deficits. Extremities: No pedal edema. Skin: Cervical lymphadenopathy she has a very shows lymph node and multiple axillary lymph nodes. Psychiatry: Judgement and insight appear normal. Mood & affect appropriate.    Data Reviewed:    Labs: Basic Metabolic Panel: Recent Labs  Lab 08/14/18 0358  NA 136  K 4.1  CL 104  CO2 23  GLUCOSE 152*  BUN 25*  CREATININE 0.41*  CALCIUM 8.5*  MG 2.3   GFR Estimated Creatinine Clearance: 57.7 mL/min (A) (by C-G formula based on SCr of 0.41 mg/dL (L)). Liver Function Tests: Recent Labs  Lab 08/14/18 0358  AST 51*  ALT 45*  ALKPHOS 127*  BILITOT 1.2  PROT 5.9*  ALBUMIN 3.3*   No results for input(s): LIPASE, AMYLASE in the last 168 hours. No results for input(s): AMMONIA in the last 168 hours. Coagulation profile No results for input(s): INR, PROTIME in the last 168 hours.  CBC: Recent Labs  Lab 08/14/18 0358  WBC 11.2*  NEUTROABS 9.7*  HGB 14.6  HCT 44.7   MCV 86.1  PLT 246   Cardiac Enzymes: No results for input(s): CKTOTAL, CKMB, CKMBINDEX, TROPONINI in the last 168 hours. BNP (last 3 results) No results for input(s): PROBNP in the last 8760 hours. CBG: No results for input(s): GLUCAP in the last 168 hours. D-Dimer: No results for input(s): DDIMER in the last 72 hours. Hgb A1c: No results for input(s): HGBA1C in the last 72 hours. Lipid Profile: No results for input(s): CHOL, HDL, LDLCALC, TRIG, CHOLHDL, LDLDIRECT in the last 72 hours. Thyroid function studies: No results for input(s): TSH, T4TOTAL, T3FREE, THYROIDAB in the last 72 hours.  Invalid input(s): FREET3 Anemia work up: No results for input(s): VITAMINB12, FOLATE, FERRITIN, TIBC, IRON, RETICCTPCT in the last 72 hours. Sepsis Labs: Recent Labs  Lab 08/14/18 0358  WBC 11.2*   Microbiology No results found for this or any previous visit (from the past 240 hour(s)).   Medications:   . dexamethasone  8 mg Oral Q8H  . famotidine  20 mg Oral BID  . feeding supplement (ENSURE ENLIVE)  237 mL Oral Q24H  . gabapentin  100 mg Oral QHS  . lidocaine  1 patch Transdermal Q24H  . lidocaine  1 patch Transdermal Q24H  . methocarbamol  500 mg Oral TID  . morphine  15 mg Oral Q12H  . multivitamin with minerals  1 tablet Oral Daily  . polyethylene glycol  17 g Oral BID  . senna-docusate  1 tablet Oral BID   Continuous Infusions:   LOS: 6 days   Charlynne Cousins  [facility]   *Please refer to [email or web address], password [email or web address] to get updated schedule on who will round on this patient, as hospitalists switch teams weekly. If 7PM-7AM, please contact night-coverage at [email or web address], password [email or web address] for any overnight needs.  08/17/2018, 8:21 AM

## 2018-08-18 DIAGNOSIS — I471 Supraventricular tachycardia: Secondary | ICD-10-CM

## 2018-08-18 MED ORDER — DEXAMETHASONE 4 MG PO TABS: 8.0000 mg | ORAL_TABLET | Freq: Three times a day (TID) | ORAL | 0 refills | Status: DC

## 2018-08-18 MED ORDER — OXYCODONE HCL 15 MG PO TABS: 15.0000 mg | ORAL_TABLET | ORAL | 0 refills | Status: DC | PRN

## 2018-08-18 MED ORDER — MORPHINE SULFATE ER 15 MG PO TBCR: 15.0000 mg | EXTENDED_RELEASE_TABLET | Freq: Two times a day (BID) | ORAL | 0 refills | Status: DC

## 2018-08-18 MED ORDER — POLYETHYLENE GLYCOL 3350 17 G PO PACK: 17.0000 g | PACK | Freq: Two times a day (BID) | ORAL | 0 refills | Status: AC

## 2018-08-18 MED ORDER — FAMOTIDINE 20 MG PO TABS: 20.0000 mg | ORAL_TABLET | Freq: Two times a day (BID) | ORAL | 3 refills | Status: AC

## 2018-08-18 MED ORDER — LIDOCAINE 5 % EX PTCH: 1.0000 | MEDICATED_PATCH | CUTANEOUS | 0 refills | Status: DC

## 2018-08-18 MED ORDER — GABAPENTIN 100 MG PO CAPS: 100.0000 mg | ORAL_CAPSULE | Freq: Every day | ORAL | 0 refills | Status: AC

## 2018-08-18 NOTE — Progress Notes (Signed)
Discharge instructions explained to patient and several prescriptions given to patient. Patient's son came in to pick up patient and take her home. Beena discharged via wheelchair.

## 2018-08-18 NOTE — Discharge Summary (Signed)
Physician Discharge Summary  Kristina Mcfarland ZYS:[id number] DOB: 1956-04-01 DOA: 08/08/2018  PCP: Willeen Niece, PA  Admit date: 08/08/2018 Discharge date: 08/18/2018  Admitted From: home Disposition:  Home  Recommendations for Outpatient Follow-up:  1. Follow up with Oncology in 1-2 weeks 2. Please obtain BMP/CBC in one week. 3. She continue radiation therapy as an outpatient.  Home Health:No Equipment/Devices:none  Discharge Condition:stable CODE STATUS:full Diet recommendation: Heart Healthy  Brief/Interim Summary: 63 y.o. female past medical history significant for breast cancer diagnosed in August after mammography comes into the emergency room complaining of progressive back pain to her legs particular her right side for 6 weeks, accompanied by left upper extremity numbness and weakness, MRI and CT showed extensive metastatic disease involving both the cervical, thoracic spine with pathological fracture.  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 IR was consulted to perform a bone biopsy on 08/09/2018 that was positive for metastatic adenocarcinoma hormonal studies are pending. She was started on dexamethasone which she will continue as an outpatient 12 lidocaine patch. She also started on Neurontin. She also started on long-acting and short-acting narcotic regimen for her pain. College he was consulted and they recommended follow-up as an outpatient to start chemotherapy once studies are back.   Cancer related pain Both for further details.  History of paroxysmal atrial fibrillation  no events.  Normal LFTs: Likely due to metastatic disease.  Constipation: Likely due to narcotics continue MiraLAX as an outpatient.  Failure to thrive. Physical therapy evaluate the patient recommended skilled nursing facility due to difficulties and financial difficulties the patient will have to go home. Continue physical therapy as an outpatient. Discharge Instructions  Discharge Instructions    Diet - low sodium heart healthy   Complete by:  As directed    Increase activity slowly   Complete by:  As directed      Allergies as of 08/18/2018      Reactions   Oxycodone-acetaminophen Nausea And Vomiting   Other    Medication for SVT, Anderal?      Medication List    TAKE these medications   ARTHRITIS STRENGTH BC POWDER PO Take 1 Package by mouth daily as needed (arthritis pain).   dexamethasone 4 MG tablet Commonly known as:  DECADRON Take 2 tablets (8 mg total) by mouth every 8 (eight) hours.   famotidine 20 MG tablet Commonly known as:  PEPCID Take 1 tablet (20 mg total) by mouth 2 (two) times daily.   gabapentin 100 MG capsule Commonly known as:  NEURONTIN Take 1 capsule (100 mg total) by mouth at bedtime.   lidocaine 5 % Commonly known as:  LIDODERM Place 1 patch onto the skin daily. Remove & Discard patch within 12 hours or as directed by MD   MELATONIN PO Take 1 tablet by mouth at bedtime as needed (sleep).   morphine 15 MG 12 hr tablet Commonly known as:  MS CONTIN Take 1 tablet (15 mg total) by mouth every 12 (twelve) hours.   oxyCODONE 15 MG immediate release tablet Commonly known as:  ROXICODONE Take 1 tablet (15 mg total) by mouth every 4 (  four) hours as needed for moderate pain or severe pain.   polyethylene glycol packet Commonly known as:  MIRALAX / GLYCOLAX Take 17 g by mouth 2 (two) times daily.       Allergies  Allergen Reactions  . Oxycodone-Acetaminophen Nausea And Vomiting  . Other     Medication for SVT, Anderal?     Consultations:  Onoclogy   Procedures/Studies: Ct Chest Wo Contrast  Result Date: 08/08/2018 CLINICAL DATA:  Evaluate lung nodule EXAM: CT CHEST WITHOUT CONTRAST TECHNIQUE: Multidetector CT imaging of the chest was performed following the standard protocol without IV contrast. COMPARISON:  None available FINDINGS: Cardiovascular: No significant vascular findings. Normal heart size. No pericardial effusion. Mediastinum/Nodes: Noncalcified normal size mediastinal lymph nodes Lungs/Pleura: 6 mm nodule in the RIGHT upper lobe (image 61/5) Upper Abdomen: Limited view of the liver, kidneys, pancreas are unremarkable. Normal adrenal glands. Musculoskeletal: Extensive lytic skeletal metastasis throughout the thoracic spine. Several rib tubal bodies are nearly completely replaced by lytic process (for example T1). Example lesion at T8 extends through the pedicle measures 2.3 cm. Multiple lytic lesions within the ribs and several pathologic fractures. Lytic lesion within the manubrium additionally. IMPRESSION: 1. Indeterminate RIGHT upper lobe pulmonary nodule. In patient with metastatic breast cancer, recommend short-term CT follow-up (3 months). 2. Calcified mediastinal lymph nodes could represent breast cancer metastasis, potentially treatment effect. 3. Extensive expansile metastatic lytic lesions throughout the spine and ribs. Electronically Signed   By: Suzy Bouchard M.D.   On: 08/08/2018 08:08   Ct Chest W Contrast  Result Date: 08/08/2018 CLINICAL DATA:  Metastatic breast cancer. EXAM: CT CHEST, ABDOMEN, AND PELVIS WITH CONTRAST TECHNIQUE: Multidetector CT imaging of the chest, abdomen and pelvis was performed following the standard protocol during bolus administration of intravenous contrast. CONTRAST:  115mL OMNIPAQUE IOHEXOL 300 MG/ML  SOLN COMPARISON:  Chest CT 08/08/2018 and lumbar spine MRI FINDINGS: CT CHEST FINDINGS Cardiovascular: The heart is normal in size. No pericardial effusion.  Normal caliber thoracic aorta without dissection or significant atherosclerotic calcifications. The branch vessels are patent. No definite coronary artery calcifications. Mediastinum/Nodes: Mediastinal and bilateral hilar lymphadenopathy as demonstrated on the prior recent chest CT. 8.5 mm pretracheal lymph node on image number 22. 13 mm AP window node on image number 21. 15 mm right hilar node on image number 28. 11 mm left hilar lymph node on image number 23. Lungs/Pleura: Stable 6 mm right upper lobe pulmonary nodule on image number 65 most likely a metastatic focus. A do not however see any other definite metastatic pulmonary nodules. Stable emphysematous changes and areas of pulmonary scarring. Chest wall/musculoskeletal: Skin thickening of the left breast is noted along with subareolar density. Deeper in the left breast there is a 16 mm soft tissue mass with a probable biopsy clip nearby. Extensive left axillary lymphadenopathy. Largest node measures 2.1 x 2.3 cm on image number 13. 10.5 mm left subclavicular node on image 4. 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 Evidence of tumor in the spinal canal on the left at T5, dorsally at T7, on the left side at T8 and ventrally and to the right at L2. There is no significant canal compromise at this time but findings are worrisome. CT ABDOMEN PELVIS FINDINGS Hepatobiliary: No focal hepatic lesions to suggest metastatic disease. Simple appearing cyst noted in segment 3. The portal and hepatic veins are patent. The gallbladder is surgically absent. No common bile duct dilatation. Pancreas: No mass, inflammation or ductal dilatation. Spleen: Normal  size.  No gall lesions. Adrenals/Urinary Tract: The adrenal glands and kidneys are unremarkable. Small bilateral lower pole renal calculi. The bladder is normal. Stomach/Bowel: The stomach, duodenum, small bowel and colon are unremarkable. No acute inflammatory changes,  mass lesions or obstructive findings. The terminal ileum and appendix are normal. Vascular/Lymphatic: Scattered atherosclerotic calcifications involving the abdominal aorta and iliac arteries, advanced for age. No aneurysm or dissection. The branch vessels are patent. The major venous structures are patent. No mesenteric or retroperitoneal mass or adenopathy. Reproductive: The uterus and ovaries are normal. Other: No pelvic mass or adenopathy. No free pelvic fluid collections. No inguinal adenopathy. No subcutaneous lesions. 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 Areas of cortical breakthrough and associated soft tissue masses involving the spine and pelvis. IMPRESSION: 1. Skin thickening of the left breast, subareolar masslike density and a smaller mass in the deep aspect of the left breast. Associated bulky metastatic left axillary lymphadenopathy. 2. Mediastinal and hilar lymphadenopathy and a single right upper lobe pulmonary nodule which is likely metastatic. 3. Widespread aggressive/lytic/destructive bony metastatic disease with areas of spinal canal involvement and areas of cortical breakthrough and associated soft tissue mass is most notably in the pelvis. 4. No findings to suggest abdominal/pelvic metastatic disease. Electronically Signed   By: Marijo Sanes M.D.   On: 08/08/2018 17:40   Mr Lumbar Spine Wo Contrast  Result Date: 08/08/2018 CLINICAL DATA:  Low back hip pain. EXAM: MRI LUMBAR SPINE WITHOUT CONTRAST TECHNIQUE: Multiplanar, multisequence MR imaging of the lumbar spine was performed. No intravenous contrast was administered. COMPARISON:  Overlapping portions from CT chest dated 08/08/2018 FINDINGS: Segmentation: The lowest lumbar type non-rib-bearing vertebra is labeled as L5. Alignment:  No vertebral subluxation is observed. Vertebrae: Extensive tumor infiltration of the visualized osseous structures.  There are bulging margins of a variety of cortical surfaces including posteriorly and anteriorly along the sacrum, posteriorly along the L2 vertebral body eccentric to the right, and posteriorly along the L3 vertebral body as well as in both iliac bones, suspicious for malignancy. Schmorl's nodes or small central endplate fractures along the superior and inferior endplates of L3 raise the possibility that the posterior bulging at L3 could be from mild posterior bony retropulsion. Heterogeneous lesion throughout the posterior elements of the lumbar spine. Incidental 2.2 cm hemangioma the L2 vertebral body. Congenitally short pedicles. Conus medullaris and cauda equina: Conus extends to the L1 level. Conus and cauda equina appear normal. Paraspinal and other soft tissues: Unremarkable Disc levels: L1-2: Unremarkable. L2-3: Mild right subarticular lateral recess stenosis due to posterior tumor extension along the vertebral margin, epidural extension of tumor is a distinct possibility. Borderline central narrowing of the thecal sac. L3-4: Posterior bony convexity at L3 potentially from epidural tumor or cortical bulging from tumor. Prominent central narrowing of the thecal sac primarily from short pedicles. L4-5: Prominent central narrowing of the thecal sac along with mild bilateral foraminal stenosis primarily from short pedicles, disc bulge, and facet arthropathy. L5-S1: Borderline right foraminal stenosis due to facet and intervertebral spurring. Suspected prior right laminectomy. IMPRESSION: 1. Nearly diffuse malignant involvement of the visualized bony structures. Posterior bony convexity at multiple levels especially L2 and L3 and also in the sacrum possibly from epidural extension of tumor as well as vertebral expansion from tumor. Given the left axillary adenopathy in left posterior breast mass on prior CT chest, this is presumably from metastatic breast cancer, correlate with patient history. 2. Lumbar  spondylosis, degenerative  disc disease, and congenitally short pedicles contribute to prominent central narrowing of the thecal sac L3-4 and L4-5 allow with mild foraminal impingement at L2-3 and L4-5, as detailed above. Electronically Signed   By: Van Clines M.D.   On: 08/08/2018 09:53   Mr Cervical Spine W Wo Contrast  Result Date: 109-27-202019 CLINICAL DATA:  Back pain.  Metastatic breast cancer. EXAM: MRI CERVICAL SPINE WITHOUT AND WITH CONTRAST TECHNIQUE: Multiplanar and multiecho pulse sequences of the cervical spine, to include the craniocervical junction and cervicothoracic junction, were obtained without and with intravenous contrast. CONTRAST:  6 cc Gadavist. COMPARISON:  None. FINDINGS: Alignment: Physiologic. Vertebrae: There is metastatic disease involving the entire cervical spine and the visualized portion of the upper thoracic spine. There are pathologic fractures of C4 and T1 and T3. The tumor involves in the vertebral bodies as well as the posterior elements at multiple levels. There is abnormal dural enhancement throughout the visualized portion of the spine after contrast administration consistent with dural extension of tumor. The patient is at school significant risk for further pathologic fractures. Cord: There is no evidence of metastatic disease to the cervical spinal cord. The AP dimension of the spinal canal is narrowed from C3-4 through C6-7 by bulging discs and by posterior protrusion of the C4 vertebral body into the spinal canal. However, there is no myelopathy. Posterior Fossa, vertebral arteries, paraspinal tissues: No discrete abnormality of the paraspinal soft tissues. Disc levels: C2-3: No disc protrusion or bulging. Multiple areas of tumor within the L2 vertebra. C3-4: No disc protrusion. Tumor infiltrates C3 and C4 with a pathologic fracture of C4 with slight protrusion of the posterior margin of the C4 vertebral body into the spinal canal. Epidural enhancement  consistent with tumor. C4-5: No disc protrusion. Tumor involves both vertebra. Epidural enhancement. C5-6: Disc space narrowing. Small broad-based disc bulge with accompanying osteophytes without neural impingement. Epidural enhancement. C6-7: Diffuse tumor in the bones. No disc protrusion. Epidural enhancement. C7-T1: Diffuse infiltration of the vertebral bodies and posterior elements by tumor. Pathologic fracture of T1 with slight protrusion of the posterior margin of T1 into the spinal canal without spinal cord impingement. Abnormal epidural enhancement. EXAM: MRI THORACIC WITHOUT AND WITH CONTRAST TECHNIQUE: Multiplanar and multiecho pulse sequences of the thoracic spine were obtained without and with intravenous contrast. CONTRAST:  6 mL Gadavist COMPARISON:  None.  CT scan of the chest dated 08/08/2018 FINDINGS: MRI THORACIC SPINE FINDINGS Alignment:  Physiologic. Vertebrae: Extensive metastatic disease involving the entire visualized portion of the spine including the vertebral bodies and posterior elements. Pathologic fractures of T1 and T3 and of the superior endplate of T5. Cord:  Normal.  Tip of the conus is at L1-2. Paraspinal and other soft tissues: Negative. Disc levels: T1-2: There is a pathologic compression fracture of T1. The posterior margin of the vertebral body extends slightly into the spinal canal without spinal cord compression. T4-5: Prominent tumor in the left pedicle and lamina of T5 slightly indents the thecal sac. Tumor in the lamina of T7 slightly compresses the posterior aspect of the thecal sac. The other levels in the thoracic spine demonstrate no significant impingement upon the thecal sac or spinal cord. Tumor in the right side of the vertebral body of L2 and in the right pedicle and posterior elements does compress the thecal sac. There is slight epidural enhancement in the upper and midthoracic spine consistent with tumor. IMPRESSION: 1. Diffuse metastatic disease involving the  entire cervical and thoracic spine with multiple  pathologic fractures. The patient is at substantial risk for more pathologic fractures. 2. No discrete spinal cord compression at this time 3. Epidural enhancement after contrast administration in the cervical and thoracic spine consistent with tumor. Electronically Signed   By: Lorriane Shire M.D.   On: 110/29/202019 17:47   Mr Thoracic Spine W Wo Contrast  Result Date: 110/29/202019 CLINICAL DATA:  Back pain.  Metastatic breast cancer. EXAM: MRI CERVICAL SPINE WITHOUT AND WITH CONTRAST TECHNIQUE: Multiplanar and multiecho pulse sequences of the cervical spine, to include the craniocervical junction and cervicothoracic junction, were obtained without and with intravenous contrast. CONTRAST:  6 cc Gadavist. COMPARISON:  None. FINDINGS: Alignment: Physiologic. Vertebrae: There is metastatic disease involving the entire cervical spine and the visualized portion of the upper thoracic spine. There are pathologic fractures of C4 and T1 and T3. The tumor involves in the vertebral bodies as well as the posterior elements at multiple levels. There is abnormal dural enhancement throughout the visualized portion of the spine after contrast administration consistent with dural extension of tumor. The patient is at school significant risk for further pathologic fractures. Cord: There is no evidence of metastatic disease to the cervical spinal cord. The AP dimension of the spinal canal is narrowed from C3-4 through C6-7 by bulging discs and by posterior protrusion of the C4 vertebral body into the spinal canal. However, there is no myelopathy. Posterior Fossa, vertebral arteries, paraspinal tissues: No discrete abnormality of the paraspinal soft tissues. Disc levels: C2-3: No disc protrusion or bulging. Multiple areas of tumor within the L2 vertebra. C3-4: No disc protrusion. Tumor infiltrates C3 and C4 with a pathologic fracture of C4 with slight protrusion of the posterior margin  of the C4 vertebral body into the spinal canal. Epidural enhancement consistent with tumor. C4-5: No disc protrusion. Tumor involves both vertebra. Epidural enhancement. C5-6: Disc space narrowing. Small broad-based disc bulge with accompanying osteophytes without neural impingement. Epidural enhancement. C6-7: Diffuse tumor in the bones. No disc protrusion. Epidural enhancement. C7-T1: Diffuse infiltration of the vertebral bodies and posterior elements by tumor. Pathologic fracture of T1 with slight protrusion of the posterior margin of T1 into the spinal canal without spinal cord impingement. Abnormal epidural enhancement. EXAM: MRI THORACIC WITHOUT AND WITH CONTRAST TECHNIQUE: Multiplanar and multiecho pulse sequences of the thoracic spine were obtained without and with intravenous contrast. CONTRAST:  6 mL Gadavist COMPARISON:  None.  CT scan of the chest dated 08/08/2018 FINDINGS: MRI THORACIC SPINE FINDINGS Alignment:  Physiologic. Vertebrae: Extensive metastatic disease involving the entire visualized portion of the spine including the vertebral bodies and posterior elements. Pathologic fractures of T1 and T3 and of the superior endplate of T5. Cord:  Normal.  Tip of the conus is at L1-2. Paraspinal and other soft tissues: Negative. Disc levels: T1-2: There is a pathologic compression fracture of T1. The posterior margin of the vertebral body extends slightly into the spinal canal without spinal cord compression. T4-5: Prominent tumor in the left pedicle and lamina of T5 slightly indents the thecal sac. Tumor in the lamina of T7 slightly compresses the posterior aspect of the thecal sac. The other levels in the thoracic spine demonstrate no significant impingement upon the thecal sac or spinal cord. Tumor in the right side of the vertebral body of L2 and in the right pedicle and posterior elements does compress the thecal sac. There is slight epidural enhancement in the upper and midthoracic spine consistent  with tumor. IMPRESSION: 1. Diffuse metastatic disease involving the entire  cervical and thoracic spine with multiple pathologic fractures. The patient is at substantial risk for more pathologic fractures. 2. No discrete spinal cord compression at this time 3. Epidural enhancement after contrast administration in the cervical and thoracic spine consistent with tumor. Electronically Signed   By: Lorriane Shire M.D.   On: 120-Apr-202019 17:47   Ct Abdomen Pelvis W Contrast  Result Date: 08/08/2018 CLINICAL DATA:  Metastatic breast cancer. EXAM: CT CHEST, ABDOMEN, AND PELVIS WITH CONTRAST TECHNIQUE: Multidetector CT imaging of the chest, abdomen and pelvis was performed following the standard protocol during bolus administration of intravenous contrast. CONTRAST:  170mL OMNIPAQUE IOHEXOL 300 MG/ML  SOLN COMPARISON:  Chest CT 08/08/2018 and lumbar spine MRI FINDINGS: CT CHEST FINDINGS Cardiovascular: The heart is normal in size. No pericardial effusion. Normal caliber thoracic aorta without dissection or significant atherosclerotic calcifications. The branch vessels are patent. No definite coronary artery calcifications. Mediastinum/Nodes: Mediastinal and bilateral hilar lymphadenopathy as demonstrated on the prior recent chest CT. 8.5 mm pretracheal lymph node on image number 22. 13 mm AP window node on image number 21. 15 mm right hilar node on image number 28. 11 mm left hilar lymph node on image number 23. Lungs/Pleura: Stable 6 mm right upper lobe pulmonary nodule on image number 65 most likely a metastatic focus. A do not however see any other definite metastatic pulmonary nodules. Stable emphysematous changes and areas of pulmonary scarring. Chest wall/musculoskeletal: Skin thickening of the left breast is noted along with subareolar density. Deeper in the left breast there is a 16 mm soft tissue mass with a probable biopsy clip nearby. Extensive left axillary lymphadenopathy. Largest node measures 2.1 x 2.3 cm  on image number 13. 10.5 mm left subclavicular node on image 4. 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 Evidence of tumor in the spinal canal on the left at T5, dorsally at T7, on the left side at T8 and ventrally and to the right at L2. There is no significant canal compromise at this time but findings are worrisome. CT ABDOMEN PELVIS FINDINGS Hepatobiliary: No focal hepatic lesions to suggest metastatic disease. Simple appearing cyst noted in segment 3. The portal and hepatic veins are patent. The gallbladder is surgically absent. No common bile duct dilatation. Pancreas: No mass, inflammation or ductal dilatation. Spleen: Normal size.  No gall lesions. Adrenals/Urinary Tract: The adrenal glands and kidneys are unremarkable. Small bilateral lower pole renal calculi. The bladder is normal. Stomach/Bowel: The stomach, duodenum, small bowel and colon are unremarkable. No acute inflammatory changes, mass lesions or obstructive findings. The terminal ileum and appendix are normal. Vascular/Lymphatic: Scattered atherosclerotic calcifications involving the abdominal aorta and iliac arteries, advanced for age. No aneurysm or dissection. The branch vessels are patent. The major venous structures are patent. No mesenteric or retroperitoneal mass or adenopathy. Reproductive: The uterus and ovaries are normal. Other: No pelvic mass or adenopathy. No free pelvic fluid collections. No inguinal adenopathy. No subcutaneous lesions. 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 Areas of cortical breakthrough and associated soft tissue masses involving the spine and pelvis. IMPRESSION: 1. Skin thickening of the left breast, subareolar masslike density and a smaller mass in the deep aspect of the left breast. Associated bulky metastatic left axillary lymphadenopathy. 2. Mediastinal and hilar  lymphadenopathy and a single right upper lobe pulmonary nodule which is likely metastatic. 3. Widespread aggressive/lytic/destructive bony metastatic disease with areas of spinal canal involvement and areas of cortical breakthrough  and associated soft tissue mass is most notably in the pelvis. 4. No findings to suggest abdominal/pelvic metastatic disease. Electronically Signed   By: Marijo Sanes M.D.   On: 08/08/2018 17:40   Ct Biopsy  Result Date: 08/09/2018 INDICATION: Multiple lytic bone lesions EXAM: CT BIOPSY MEDICATIONS: None. ANESTHESIA/SEDATION: Fentanyl 100 mcg IV; Versed 3 mg IV Moderate Sedation Time:  14 minutes The patient was continuously monitored during the procedure by the interventional radiology nurse under my direct supervision. FLUOROSCOPY TIME:  Fluoroscopy Time:  minutes  seconds ( mGy). COMPLICATIONS: None immediate. PROCEDURE: Informed written consent was obtained from the patient after a thorough discussion of the procedural risks, benefits and alternatives. All questions were addressed. Maximal Sterile Barrier Technique was utilized including caps, mask, sterile gowns, sterile gloves, sterile drape, hand hygiene and skin antiseptic. A timeout was performed prior to the initiation of the procedure. Under CT guidance, a(n) 17 gauge guide needle was advanced into the left sacral bone lesion. Subsequently, 3 18 gauge core biopsies were obtained. The guide needle was removed. Post biopsy images demonstrate no hemorrhage. Patient tolerated the procedure well without complication. Vital sign monitoring by nursing staff during the procedure will continue as patient is in the special procedures unit for post procedure observation. FINDINGS: The images document guide needle placement within the left sacral bone lesion. Post biopsy images demonstrate no hemorrhage. IMPRESSION: Successful CT-guided core biopsy of a left sacral bone lesion. Electronically Signed   By: Marybelle Killings M.D.   On:  08/09/2018 15:47    Subjective: No complains  Discharge Exam: Vitals:   08/17/18 2057 08/18/18 0540  BP: 129/80 126/75  Pulse: 84 76  Resp: 16 18  Temp: 98.6 F (37 C) 98.3 F (36.8 C)  SpO2: 100% 99%   Vitals:   08/17/18 0604 08/17/18 1238 08/17/18 2057 08/18/18 0540  BP: 120/76 121/73 129/80 126/75  Pulse: 77 87 84 76  Resp: 18 16 16 18   Temp: 98.7 F (37.1 C) 98.1 F (36.7 C) 98.6 F (37 C) 98.3 F (36.8 C)  TempSrc: Oral Oral Oral Oral  SpO2: 98% 98% 100% 99%  Weight:      Height:        General: Pt is alert, awake, not in acute distress Cardiovascular: RRR, S1/S2 +, no rubs, no gallops Respiratory: CTA bilaterally, no wheezing, no rhonchi Abdominal: Soft, NT, ND, bowel sounds + Extremities: no edema, no cyanosis    The results of significant diagnostics from this hospitalization (including imaging, microbiology, ancillary and laboratory) are listed below for reference.     Microbiology: No results found for this or any previous visit (from the past 240 hour(s)).   Labs: BNP (last 3 results) No results for input(s): BNP in the last 8760 hours. Basic Metabolic Panel: Recent Labs  Lab 08/14/18 0358  NA 136  K 4.1  CL 104  CO2 23  GLUCOSE 152*  BUN 25*  CREATININE 0.41*  CALCIUM 8.5*  MG 2.3   Liver Function Tests: Recent Labs  Lab 08/14/18 0358  AST 51*  ALT 45*  ALKPHOS 127*  BILITOT 1.2  PROT 5.9*  ALBUMIN 3.3*   No results for input(s): LIPASE, AMYLASE in the last 168 hours. No results for input(s): AMMONIA in the last 168 hours. CBC: Recent Labs  Lab 08/14/18 0358  WBC 11.2*  NEUTROABS 9.7*  HGB 14.6  HCT 44.7  MCV 86.1  PLT 246   Cardiac Enzymes: No results for input(s): CKTOTAL, CKMB, CKMBINDEX, TROPONINI in  the last 168 hours. BNP: Invalid input(s): POCBNP CBG: No results for input(s): GLUCAP in the last 168 hours. D-Dimer No results for input(s): DDIMER in the last 72 hours. Hgb A1c No results for input(s):  HGBA1C in the last 72 hours. Lipid Profile No results for input(s): CHOL, HDL, LDLCALC, TRIG, CHOLHDL, LDLDIRECT in the last 72 hours. Thyroid function studies No results for input(s): TSH, T4TOTAL, T3FREE, THYROIDAB in the last 72 hours.  Invalid input(s): FREET3 Anemia work up No results for input(s): VITAMINB12, FOLATE, FERRITIN, TIBC, IRON, RETICCTPCT in the last 72 hours. Urinalysis No results found for: COLORURINE, APPEARANCEUR, LABSPEC, [place], GLUCOSEU, HGBUR, BILIRUBINUR, KETONESUR, PROTEINUR, UROBILINOGEN, NITRITE, LEUKOCYTESUR Sepsis Labs Invalid input(s): PROCALCITONIN,  WBC,  LACTICIDVEN Microbiology No results found for this or any previous visit (from the past 240 hour(s)).   Time coordinating discharge: 40 minutes  SIGNED:   Charlynne Cousins, MD  [facility] 08/18/2018, 9:27 AM Pager   If 7PM-7AM, please contact night-coverage [email or web address] Password [email or web address]

## 2018-08-18 NOTE — Discharge Instructions (Signed)
Acute Pain, Adult Acute pain is a type of pain that may last for just a few days or as long as six months. It is often related to an illness, injury, or medical procedure. Acute pain may be mild, moderate, or severe. It usually goes away once your injury has healed or you are no longer ill. Pain can make it hard for you to do daily activities. It can cause anxiety and lead to other problems if left untreated. Treatment depends on the cause and severity of your acute pain. Follow these instructions at home:  Check your pain level as told by your health care provider.  Take over-the-counter and prescription medicines only as told by your health care provider.  If you are taking prescription pain medicine: ? Ask your health care provider about taking a stool softener or laxative to prevent constipation. ? Do not stop taking the medicine suddenly. Talk to your health care provider about how and when to discontinue prescription pain medicine. ? If your pain is severe, do not take more pills than instructed by your health care provider. ? Do not take other over-the-counter pain medicines in addition to this medicine unless told by your health care provider. ? Do not drive or operate heavy machinery while taking prescription pain medicine.  Apply ice or heat as told by your health care provider. These may reduce swelling and pain.  Ask your health care provider if other strategies such as distraction, relaxation, or physical therapies can help your pain.  Keep all follow-up visits as told by your health care provider. This is important. Contact a health care provider if:  You have pain that is not controlled by medicine.  Your pain does not improve or gets worse.  You have side effects from pain medicines, such as vomitingor confusion. Get help right away if:  You have severe pain.  You have trouble breathing.  You lose consciousness.  You have chest pain or pressure that lasts for more  than a few minutes. Along with the chest pain you may: ? Have pain or discomfort in one or both arms, your back, neck, jaw, or stomach. ? Have shortness of breath. ? Break out in a cold sweat. ? Feel nauseous. ? Become light-headed. These symptoms may represent a serious problem that is an emergency. Do not wait to see if the symptoms will go away. Get medical help right away. Call your local emergency services (911 in the [place] Do not drive yourself to the hospital. This information is not intended to replace advice given to you by your health care provider. Make sure you discuss any questions you have with your health care provider. Document Released: 08/15/2015 Document Revised: 01/07/2016 Document Reviewed: 08/15/2015 Elsevier Interactive Patient Education  2019 [employer]   Back Exercises The following exercises strengthen the muscles that help to support the back. They also help to keep the lower back flexible. Doing these exercises can help to prevent back pain or lessen existing pain. If you have back pain or discomfort, try doing these exercises 2-3 times each day or as told by your health care provider. When the pain goes away, do them once each day, but increase the number of times that you repeat the steps for each exercise (do more repetitions). If you do not have back pain or discomfort, do these exercises once each day or as told by your health care provider. Exercises Single Knee to Chest Repeat these steps 3-5 times for each leg:  1. Lie on your back on a firm bed or the floor with your legs extended. 2. Bring one knee to your chest. Your other leg should stay extended and in contact with the floor. 3. Hold your knee in place by grabbing your knee or thigh. 4. Pull on your knee until you feel a gentle stretch in your lower back. 5. Hold the stretch for 10-30 seconds. 6. Slowly release and straighten your leg. Pelvic Tilt Repeat these steps 5-10 times: 1. Lie on your  back on a firm bed or the floor with your legs extended. 2. Bend your knees so they are pointing toward the ceiling and your feet are flat on the floor. 3. Tighten your lower abdominal muscles to press your lower back against the floor. This motion will tilt your pelvis so your tailbone points up toward the ceiling instead of pointing to your feet or the floor. 4. With gentle tension and even breathing, hold this position for 5-10 seconds. Cat-Cow Repeat these steps until your lower back becomes more flexible: 1. Get into a hands-and-knees position on a firm surface. Keep your hands under your shoulders, and keep your knees under your hips. You may place padding under your knees for comfort. 2. Let your head hang down, and point your tailbone toward the floor so your lower back becomes rounded like the back of a cat. 3. Hold this position for 5 seconds. 4. Slowly lift your head and point your tailbone up toward the ceiling so your back forms a sagging arch like the back of a cow. 5. Hold this position for 5 seconds.  Press-Ups Repeat these steps 5-10 times: 1. Lie on your abdomen (face-down) on the floor. 2. Place your palms near your head, about shoulder-width apart. 3. While you keep your back as relaxed as possible and keep your hips on the floor, slowly straighten your arms to raise the top half of your body and lift your shoulders. Do not use your back muscles to raise your upper torso. You may adjust the placement of your hands to make yourself more comfortable. 4. Hold this position for 5 seconds while you keep your back relaxed. 5. Slowly return to lying flat on the floor.  Bridges Repeat these steps 10 times: 1. Lie on your back on a firm surface. 2. Bend your knees so they are pointing toward the ceiling and your feet are flat on the floor. 3. Tighten your buttocks muscles and lift your buttocks off of the floor until your waist is at almost the same height as your knees. You should  feel the muscles working in your buttocks and the back of your thighs. If you do not feel these muscles, slide your feet 1-2 inches farther away from your buttocks. 4. Hold this position for 3-5 seconds. 5. Slowly lower your hips to the starting position, and allow your buttocks muscles to relax completely. If this exercise is too easy, try doing it with your arms crossed over your chest. Abdominal Crunches Repeat these steps 5-10 times: 1. Lie on your back on a firm bed or the floor with your legs extended. 2. Bend your knees so they are pointing toward the ceiling and your feet are flat on the floor. 3. Cross your arms over your chest. 4. Tip your chin slightly toward your chest without bending your neck. 5. Tighten your abdominal muscles and slowly raise your trunk (torso) high enough to lift your shoulder blades a tiny bit off of the  floor. Avoid raising your torso higher than that, because it can put too much stress on your low back and it does not help to strengthen your abdominal muscles. 6. Slowly return to your starting position. Back Lifts Repeat these steps 5-10 times: 1. Lie on your abdomen (face-down) with your arms at your sides, and rest your forehead on the floor. 2. Tighten the muscles in your legs and your buttocks. 3. Slowly lift your chest off of the floor while you keep your hips pressed to the floor. Keep the back of your head in line with the curve in your back. Your eyes should be looking at the floor. 4. Hold this position for 3-5 seconds. 5. Slowly return to your starting position. Contact a health care provider if:  Your back pain or discomfort gets much worse when you do an exercise.  Your back pain or discomfort does not lessen within 2 hours after you exercise. If you have any of these problems, stop doing these exercises right away. Do not do them again unless your health care provider says that you can. Get help right away if:  You develop sudden, severe  back pain. If this happens, stop doing the exercises right away. Do not do them again unless your health care provider says that you can. This information is not intended to replace advice given to you by your health care provider. Make sure you discuss any questions you have with your health care provider. Document Released: 09/07/2004 Document Revised: 12/04/2017 Document Reviewed: 09/24/2014 Elsevier Interactive Patient Education  2019 [employer].   Bone Scan, Care After This sheet gives you information about how to care for yourself after your procedure. Your health care provider may also give you more specific instructions. If you have problems or questions, contact your health care provider. What can I expect after the procedure? After the procedure, it is common to have soreness where your IV was inserted. Follow these instructions at home:   Drink enough fluid to keep your urine pale yellow. This helps to flush the radioactive substance (radiotracer) out of your body. It will take several days for your body to get rid of all the radiotracer.  Return to your normal activities as told by your health care provider. Ask your health care provider what activities are safe for you.  Take over-the-counter and prescription medicines only as told by your health care provider.  If you are breastfeeding, give your baby either formula or breast milk that you pumped before the scan. Do this for 3 days. ? Throw away any breast milk that you pump during the 3 days after your scan. ? Return to breastfeeding as told by your health care provider. Contact a health care provider if you:  Have redness, swelling, or pain at your IV site.  Develop a rash. Get help right away if you:  Have chest pain.  Have trouble breathing. Summary  After a bone scan, it is common to have soreness where your IV was inserted.  Carefully follow instructions from your health care provider, including when you can  return to normal activities and how much fluid you should drink.  Take over-the-counter and prescription medicines only as told by your health care provider. This information is not intended to replace advice given to you by your health care provider. Make sure you discuss any questions you have with your health care provider. Document Released: 12/15/2014 Document Revised: 06/04/2017 Document Reviewed: 06/04/2017 Elsevier Interactive Patient Education  2019  [employer]   Bone Metastasis  Bone metastasis is cancer that has spread from the part of the body where it started to the bones. A person may have bone metastasis in one bone or in more than one bone. Cancer that spreads to the bones is different from cancer that starts in the bones (primary bone cancer). Bone metastasis is more common than primary bone cancer. The spine is the most common area for bone metastasis to occur. Other common areas include:  Hip bone (pelvis).  Ribs.  Skull.  Long bones of the arm or leg. Bone metastasis is painful. It also damages and weakens bones so that they may break easier, even from a minor injury. What are the causes? This condition is caused by cancer cells that spread to the bone. Cancer cells can spread to the rest of the body in two ways:  Through the bloodstream.  Through the vessels that carry white blood cells in the body (lymphatic system). What increases the risk? This condition is more likely to develop in people who have an advanced type of cancer that is known to spread to bone. Cancers that often spread to bone include:  Breast cancer.  Prostate cancer.  Lung cancer.  Thyroid cancer.  Kidney cancer.  Multiple myeloma.  Lymphoma. What are the signs or symptoms? The most common symptom of this condition is bone pain, especially while you are resting. Other symptoms include:  A broken bone (fracture) that happens with little or no trauma.  Low number of red blood  cells (anemia). Bone destruction may damage the spongy tissue (bone marrow) in the center of bones where red blood cells are produced. Anemia can cause: ? Weakness. ? Shortness of breath. ? Headache. ? Dizziness.  Back or neck pain with numbness or weakness.  High levels of calcium in your blood (hypercalcemia). When bone is destroyed, calcium is released into your blood. Symptoms of hypercalcemia include: ? Constipation. ? Thirst. ? Nausea. ? Sleepiness. How is this diagnosed? This condition may be diagnosed based on:  Your symptoms and medical history. Your health care provider may suspect this condition if you are being treated for cancer or have had cancer treatment in the past.  A physical exam.  Imaging studies, such as: ? Bone X-rays, especially in the area where you have pain. ? CT scan. ? Bone scan. ? MRI. ? PET scan.  Blood tests.  Urine tests.  A procedure to remove a piece of bone so it can be examined under a microscope (biopsy). How is this treated? Treatment for this condition depends on your overall health, the type of cancer you have, and how much the cancer has spread. You will work with a team of health care providers to determine which treatment is best for you. Treatment will focus on managing pain, preventing bone weakness, and slowing the spread of the cancer. Treatment may include:  Radiation therapy. This treatment uses X-rays to kill cancer cells. It is most effective for reducing pain, stopping tumor growth, and lowering the risk of fractures.  Radioisotope therapy. This treatment uses a radioactive medicine that is injected into your blood. The medicine travels to areas where cancer cells are active and kills them.  Chemotherapy. For this treatment, you are given cancer-killing medicines. You may have chemotherapy in cycles, with rest periods in between.  Medicines that: ? Help build bone (bisphosphonates and denosumab). These medicines are used  to make bones stronger and control bone pain. They may also  help to reduce hypercalcemia. ? Reduce pain (opiates).  Endocrine therapies. These therapies slow cancer growth by blocking specific chemical messengers (hormones). Some types of cancer, including breast and prostate cancers, may grow because of hormones in the body.  Targeted therapy. These drugs are used to block the growth and spread of cancer cells. These drugs target a specific part of the cancer cell and usually cause fewer side effects than chemotherapy.  Immunotherapies. These therapies use the body's defense system (immune system) to fight cancer cells.  Surgery. You may have surgery to remove bone cancer or to prevent or repair a fracture. Follow these instructions at home:   Take medicines only as directed by your health care provider.  If you are taking prescription pain medicine, take actions to prevent or treat constipation. Your health care provider may recommend that you: ? Drink enough fluid to keep your urine pale yellow. ? Eat foods that are high in fiber, such as fresh fruits and vegetables, whole grains, and beans. ? Limit foods that are high in fat and processed sugars, such as fried and sweet foods. ? Take an over-the-counter or prescription medicine for constipation.  Use devices to help you move around (mobility aids) as needed, such as canes, walkers, or scooters.  Do not drive or use heavy machinery while taking pain medicine.  Do not drink alcohol.  Do not use any products that contain nicotine or tobacco, such as cigarettes and e-cigarettes. If you need help quitting, ask your health care provider.  Keep all follow-up visits as told by your health care provider. This is important. Contact a health care provider if:  Your pain medicine is not helping.  You are not able to care for yourself at home. Get help right away if:  You fall or have an injury.  Your pain suddenly gets worse.  You  have trouble walking.  You have numbness or tingling in your legs.  You lose control of your bowels or your bladder.  You are very sleepy or confused. Summary  Bone metastasis is cancer that has spread from the part of the body where it started to the bones. This condition is more common than cancer that starts in the bones (primary bone cancer).  Bone metastasis is painful and damages and weakens the bones. It can also cause anemia and hypercalcemia.  Treatment for this condition depends on your overall health, the type of cancer you have, and how much the cancer has spread.  Work with your health care team to determine which treatment is best for you. Take all medicines only as told by your health care provider. This information is not intended to replace advice given to you by your health care provider. Make sure you discuss any questions you have with your health care provider. Document Released: 07/21/2002 Document Revised: 04/19/2018 Document Reviewed: 08/28/2017 Elsevier Interactive Patient Education  2019 [employer].

## 2018-08-19 ENCOUNTER — Encounter (HOSPITAL_COMMUNITY): Payer: Self-pay | Admitting: Emergency Medicine

## 2018-08-19 ENCOUNTER — Other Ambulatory Visit: Payer: Self-pay

## 2018-08-19 ENCOUNTER — Inpatient Hospital Stay (HOSPITAL_COMMUNITY)
Admission: EM | Admit: 2018-08-19 | Discharge: 2018-09-13 | DRG: 542 | Disposition: A | Discharge status: Hospice | Payer: Self-pay | Attending: Internal Medicine | Admitting: Internal Medicine

## 2018-08-19 ENCOUNTER — Ambulatory Visit
Admission: RE | Admit: 2018-08-19 | Discharge: 2018-08-19 | Disposition: A | Discharge status: Home / Self Care | Payer: Self-pay | Source: Ambulatory Visit | Attending: Radiation Oncology | Admitting: Radiation Oncology

## 2018-08-19 DIAGNOSIS — Z923 Personal history of irradiation: Secondary | ICD-10-CM

## 2018-08-19 DIAGNOSIS — B370 Candidal stomatitis: Secondary | ICD-10-CM | POA: Diagnosis not present

## 2018-08-19 DIAGNOSIS — J189 Pneumonia, unspecified organism: Secondary | ICD-10-CM | POA: Diagnosis not present

## 2018-08-19 DIAGNOSIS — C50912 Malignant neoplasm of unspecified site of left female breast: Secondary | ICD-10-CM | POA: Diagnosis present

## 2018-08-19 DIAGNOSIS — G893 Neoplasm related pain (acute) (chronic): Secondary | ICD-10-CM | POA: Diagnosis present

## 2018-08-19 DIAGNOSIS — R739 Hyperglycemia, unspecified: Secondary | ICD-10-CM | POA: Diagnosis present

## 2018-08-19 DIAGNOSIS — J9691 Respiratory failure, unspecified with hypoxia: Secondary | ICD-10-CM | POA: Diagnosis not present

## 2018-08-19 DIAGNOSIS — D61818 Other pancytopenia: Secondary | ICD-10-CM | POA: Diagnosis present

## 2018-08-19 DIAGNOSIS — T380X5A Adverse effect of glucocorticoids and synthetic analogues, initial encounter: Secondary | ICD-10-CM | POA: Diagnosis not present

## 2018-08-19 DIAGNOSIS — Z853 Personal history of malignant neoplasm of breast: Secondary | ICD-10-CM

## 2018-08-19 DIAGNOSIS — F4322 Adjustment disorder with anxiety: Secondary | ICD-10-CM | POA: Diagnosis present

## 2018-08-19 DIAGNOSIS — F329 Major depressive disorder, single episode, unspecified: Secondary | ICD-10-CM | POA: Diagnosis present

## 2018-08-19 DIAGNOSIS — R262 Difficulty in walking, not elsewhere classified: Secondary | ICD-10-CM | POA: Diagnosis present

## 2018-08-19 DIAGNOSIS — Z79890 Hormone replacement therapy (postmenopausal): Secondary | ICD-10-CM

## 2018-08-19 DIAGNOSIS — Z515 Encounter for palliative care: Secondary | ICD-10-CM | POA: Diagnosis not present

## 2018-08-19 DIAGNOSIS — Z885 Allergy status to narcotic agent status: Secondary | ICD-10-CM

## 2018-08-19 DIAGNOSIS — F17210 Nicotine dependence, cigarettes, uncomplicated: Secondary | ICD-10-CM | POA: Diagnosis present

## 2018-08-19 DIAGNOSIS — Z9049 Acquired absence of other specified parts of digestive tract: Secondary | ICD-10-CM

## 2018-08-19 DIAGNOSIS — C7951 Secondary malignant neoplasm of bone: Principal | ICD-10-CM | POA: Diagnosis present

## 2018-08-19 DIAGNOSIS — Z66 Do not resuscitate: Secondary | ICD-10-CM | POA: Diagnosis not present

## 2018-08-19 DIAGNOSIS — N200 Calculus of kidney: Secondary | ICD-10-CM | POA: Diagnosis present

## 2018-08-19 DIAGNOSIS — M8448XA Pathological fracture, other site, initial encounter for fracture: Secondary | ICD-10-CM | POA: Diagnosis present

## 2018-08-19 DIAGNOSIS — M549 Dorsalgia, unspecified: Secondary | ICD-10-CM

## 2018-08-19 DIAGNOSIS — R0902 Hypoxemia: Secondary | ICD-10-CM

## 2018-08-19 DIAGNOSIS — Z888 Allergy status to other drugs, medicaments and biological substances status: Secondary | ICD-10-CM

## 2018-08-19 DIAGNOSIS — Z9221 Personal history of antineoplastic chemotherapy: Secondary | ICD-10-CM

## 2018-08-19 DIAGNOSIS — C50919 Malignant neoplasm of unspecified site of unspecified female breast: Secondary | ICD-10-CM

## 2018-08-19 DIAGNOSIS — R29898 Other symptoms and signs involving the musculoskeletal system: Secondary | ICD-10-CM

## 2018-08-19 DIAGNOSIS — K5900 Constipation, unspecified: Secondary | ICD-10-CM | POA: Diagnosis not present

## 2018-08-19 DIAGNOSIS — Z79899 Other long term (current) drug therapy: Secondary | ICD-10-CM

## 2018-08-19 DIAGNOSIS — M62838 Other muscle spasm: Secondary | ICD-10-CM | POA: Diagnosis not present

## 2018-08-19 DIAGNOSIS — G9341 Metabolic encephalopathy: Secondary | ICD-10-CM | POA: Diagnosis not present

## 2018-08-19 LAB — BASIC METABOLIC PANEL
Anion gap: 11 (ref 5–15)
BUN: 41 mg/dL — ABNORMAL HIGH (ref 8–23)
CO2: 23 mmol/L (ref 22–32)
Calcium: 8.6 mg/dL — ABNORMAL LOW (ref 8.9–10.3)
Chloride: 100 mmol/L (ref 98–111)
Creatinine, Ser: 0.46 mg/dL (ref 0.44–1.00)
GFR calc Af Amer: >60 mL/min (ref >60)
Glucose, Bld: 146 mg/dL — ABNORMAL HIGH (ref 70–99)
Potassium: 4.2 mmol/L (ref 3.5–5.1)
Sodium: 134 mmol/L — ABNORMAL LOW (ref 135–145)

## 2018-08-19 LAB — CBC WITH DIFFERENTIAL/PLATELET
ABS IMMATURE GRANULOCYTES: 0.48 10*3/uL — AB (ref 0.00–0.07)
BASOS ABS: 0.1 10*3/uL (ref 0.0–0.1)
Basophils Relative: 1 %
Eosinophils Absolute: 0 10*3/uL (ref 0.0–0.5)
Eosinophils Relative: 0 %
HCT: 46.8 % — ABNORMAL HIGH (ref 36.0–46.0)
Hemoglobin: 15.4 g/dL — ABNORMAL HIGH (ref 12.0–15.0)
Immature Granulocytes: 3 %
Lymphocytes Relative: 5 %
Lymphs Abs: 0.7 10*3/uL (ref 0.7–4.0)
MCH: 28.8 pg (ref 26.0–34.0)
MCHC: 32.9 g/dL (ref 30.0–36.0)
MCV: 87.5 fL (ref 80.0–100.0)
Monocytes Absolute: 1.1 10*3/uL — ABNORMAL HIGH (ref 0.1–1.0)
Monocytes Relative: 7 %
NEUTROS ABS: 12.5 10*3/uL — AB (ref 1.7–7.7)
Neutrophils Relative %: 84 %
Platelets: 207 10*3/uL (ref 150–400)
RBC: 5.35 MIL/uL — ABNORMAL HIGH (ref 3.87–5.11)
RDW: 17.2 % — ABNORMAL HIGH (ref 11.5–15.5)
WBC: 14.8 10*3/uL — ABNORMAL HIGH (ref 4.0–10.5)
nRBC: 0.1 % (ref 0.0–0.2)

## 2018-08-19 MED ORDER — ONDANSETRON HCL 4 MG/2ML IJ SOLN
4.0000 mg | Freq: Once | INTRAMUSCULAR | Status: AC
  Administered 2018-08-19: 4 mg via INTRAVENOUS
  Filled 2018-08-19: qty 2

## 2018-08-19 MED ORDER — HYDROMORPHONE HCL 1 MG/ML IJ SOLN
1.0000 mg | Freq: Once | INTRAMUSCULAR | Status: AC
  Administered 2018-08-19: 1 mg via INTRAVENOUS
  Filled 2018-08-19: qty 1

## 2018-08-19 MED ORDER — MORPHINE SULFATE (PF) 4 MG/ML IV SOLN
4.0000 mg | Freq: Once | INTRAVENOUS | Status: AC
  Administered 2018-08-19: 4 mg via INTRAVENOUS
  Filled 2018-08-19: qty 1

## 2018-08-19 MED ORDER — SODIUM CHLORIDE 0.9 % IV BOLUS
500.0000 mL | Freq: Once | INTRAVENOUS | Status: AC
  Administered 2018-08-19: 500 mL via INTRAVENOUS

## 2018-08-19 MED ORDER — CYCLOBENZAPRINE HCL 10 MG PO TABS
10.0000 mg | ORAL_TABLET | Freq: Once | ORAL | Status: AC
  Administered 2018-08-19: 10 mg via ORAL
  Filled 2018-08-19: qty 1

## 2018-08-19 NOTE — ED Notes (Signed)
ED TO INPATIENT HANDOFF REPORT  Name/Age/Gender Kristina Mcfarland 63 y.o. female  Code Status Code Status History    Date Active Date Inactive Code Status Order ID Comments User Context   08/08/2018 1307 08/18/2018 1728 Full Code [id number]  Reubin Milan, MD Inpatient      Home/SNF/Other Home  Chief Complaint bp  Level of Care/Admitting Diagnosis ED Disposition    ED Disposition Condition [place]: [facility] [[id number]]  Level of Care: Med-Surg [16]  Diagnosis: Metastatic breast cancer [facility]) [[id number]]  Admitting Physician: Shelbie Proctor [[id number]]  Attending Physician: Shelbie Proctor [[id number]]  PT Class (Do Not Modify): Observation [104]  PT Acc Code (Do Not Modify): Observation [10022]       Medical History Past Medical History:  Diagnosis Date  . Breast cancer ([place]) 2019  . SVT (supraventricular tachycardia) (HCC)     Allergies Allergies  Allergen Reactions  . Oxycodone-Acetaminophen Nausea And Vomiting  . Other     Medication for SVT, Anderal?    IV Location/Drains/Wounds Patient Lines/Drains/Airways Status   Active Line/Drains/Airways    Name:   Placement date:   Placement time:   Site:   Days:   Peripheral IV 08/19/18 Right Wrist   08/19/18    2033    Wrist   less than 1          Labs/Imaging Results for orders placed or performed during the hospital encounter of 08/19/18 (from the past 48 hour(s))  CBC with Differential     Status: Abnormal   Collection Time: 08/19/18  8:32 PM  Result Value Ref Range   WBC 14.8 (H) 4.0 - 10.5 K/uL   RBC 5.35 (H) 3.87 - 5.11 MIL/uL   Hemoglobin 15.4 (H) 12.0 - 15.0 g/dL   HCT 46.8 (H) 36.0 - 46.0 %   MCV 87.5 80.0 - 100.0 fL   MCH 28.8 26.0 - 34.0 pg   MCHC 32.9 30.0 - 36.0 g/dL   RDW 17.2 (H) 11.5 - 15.5 %   Platelets 207 150 - 400 K/uL   nRBC 0.1 0.0 - 0.2 %   Neutrophils Relative % 84 %   Neutro Abs 12.5 (H) 1.7 - 7.7 K/uL   Lymphocytes Relative 5 %    Lymphs Abs 0.7 0.7 - 4.0 K/uL   Monocytes Relative 7 %   Monocytes Absolute 1.1 (H) 0.1 - 1.0 K/uL   Eosinophils Relative 0 %   Eosinophils Absolute 0.0 0.0 - 0.5 K/uL   Basophils Relative 1 %   Basophils Absolute 0.1 0.0 - 0.1 K/uL   Immature Granulocytes 3 %   Abs Immature Granulocytes 0.48 (H) 0.00 - 0.07 K/uL    Comment: Performed at [facility], [place] [address], [place], [place] [address]  Basic metabolic panel     Status: Abnormal   Collection Time: 08/19/18  8:32 PM  Result Value Ref Range   Sodium 134 (L) 135 - 145 mmol/L   Potassium 4.2 3.5 - 5.1 mmol/L   Chloride 100 98 - 111 mmol/L   CO2 23 22 - 32 mmol/L   Glucose, Bld 146 (H) 70 - 99 mg/dL   BUN 41 (H) 8 - 23 mg/dL   Creatinine, Ser 0.46 0.44 - 1.00 mg/dL   Calcium 8.6 (L) 8.9 - 10.3 mg/dL   GFR calc non Af Amer >60 >60 mL/min   GFR calc Af Amer >60 >60 mL/min   Anion gap 11 5 - 15  Comment: Performed at [facility], [place] [address], [place], [place] [address]   No results found. None  Pending Labs Unresulted Labs (From admission, onward)    Start     Ordered   08/19/18 2102  Urinalysis, Routine w reflex microscopic  Once,   R     08/19/18 2101          Vitals/Pain Today's Vitals   08/19/18 2200 08/19/18 2230 08/19/18 2300 08/19/18 2330  BP: 136/90 121/73 121/86 113/64  Pulse: 100 86 85 91  Resp:   17 19  Temp:      TempSrc:      SpO2: 95% 97% 97% 96%  Weight:      Height:      PainSc:        Isolation Precautions No active isolations  Medications Medications  HYDROmorphone (DILAUDID) injection 1 mg (1 mg Intravenous Given 08/19/18 2029)  ondansetron (ZOFRAN) injection 4 mg (4 mg Intravenous Given 08/19/18 2029)  HYDROmorphone (DILAUDID) injection 1 mg (1 mg Intravenous Given 08/19/18 2117)  cyclobenzaprine (FLEXERIL) tablet 10 mg (10 mg Oral Given 08/19/18 2117)  sodium chloride 0.9 % bolus 500 mL (0 mLs Intravenous Stopped 08/19/18 2154)  morphine 4 MG/ML  injection 4 mg (4 mg Intravenous Given 08/19/18 2245)    Mobility Non-ambulatory

## 2018-08-19 NOTE — Progress Notes (Signed)
Ms. Valladares was brought to nursing after her radiation treatment today. She had a very difficult time with treatment due to severe pain. She was discharged from the hospital yesterday with decadron, oxycodone every 4 hours as needed and mscontin every 12 hours. She is not able to tell me the exact last time she took her pain medicine. She continues to have severe pain and is not able to even sit in a wheelchair at this time. Vitals as follows: BP 126/84, pulse 95, temperature 97.8. oxygen saturation 99% on room air. Patient attempted to get in wheelchair without success. She agreed to go to the Emergency Room at this time due to uncontrolled pain. Dr. Isidore Moos was in the clinic and made aware of the above. Ms. Wainwright was brought to the Emergency Department with her son and report given to her assigned nurse. I will notify Shona Simpson PA and Blenda Nicely RN in the morning of this evenings developments.

## 2018-08-19 NOTE — ED Provider Notes (Signed)
[facility] DEPT Provider Note   CSN: [id number] Arrival date & time: 08/19/18  1836     History   Chief Complaint Chief Complaint  Patient presents with  . Back Pain    HPI Roselind Klus is a 63 y.o. female.  HPI   Patient is 63 year old female with a history of breast cancer with mets to the spine who presents the emergency department today complaining of back pain.  Pain has been constant for about a month but has been worsening since onset.  States she has been taking her home medications without relief.  She complains of worsening spasms to her bilateral lower extremities.  She denies any numbness.  She does state that her legs are weak.  She has had so much pain she is not able to get around at home.  She denies any urinary retention.  No loss control of bowel or bladder function.  No fevers or chills.  No abdominal pain nausea, vomiting, diarrhea or constipation.  No urinary symptoms.  He denies any new falls or injuries.  She states that she was recently admitted and discharged yesterday.  During her admission she was evaluated by physical therapy who recommended SNF placement.  Patient was unable to be placed by SNF and was discharged home.  Patient states that since she has been home she has been taking medications however she continues to have very severe spasms in her legs and has been unable to walk or complete any of her daily activities.  She does not have home health set up.  Past Medical History:  Diagnosis Date  . Breast cancer ([place]) 2019  . SVT (supraventricular tachycardia) [facility])     Patient Active Problem List   Diagnosis Date Noted  . Metastatic breast cancer ([place]) 08/19/2018  . Palliative care by specialist   . Goals of care, counseling/discussion   . Cancer associated pain   . Constipation   . Breast cancer metastasized to bone ([place]) 08/08/2018  . Paroxysmal SVT (supraventricular tachycardia) ([place]) 08/08/2018  . LFT elevation  08/08/2018  . Back pain 08/08/2018    Past Surgical History:  Procedure Laterality Date  . BACK SURGERY    . CARPAL TUNNEL RELEASE Bilateral   . CHOLECYSTECTOMY    . TUBAL LIGATION       OB History   No obstetric history on file.      Home Medications    Prior to Admission medications   Medication Sig Start Date End Date Taking? Authorizing Provider  Aspirin-Salicylamide-Caffeine (ARTHRITIS STRENGTH BC POWDER PO) Take 1 Package by mouth daily as needed (arthritis pain).   Yes [provider]  dexamethasone (DECADRON) 4 MG tablet Take 2 tablets (8 mg total) by mouth every 8 (eight) hours. 08/18/18  Yes Charlynne Cousins, MD  famotidine (PEPCID) 20 MG tablet Take 1 tablet (20 mg total) by mouth 2 (two) times daily. 08/18/18  Yes Charlynne Cousins, MD  gabapentin (NEURONTIN) 100 MG capsule Take 1 capsule (100 mg total) by mouth at bedtime. 08/18/18  Yes Charlynne Cousins, MD  lidocaine (LIDODERM) 5 % Place 1 patch onto the skin daily. Remove & Discard patch within 12 hours or as directed by MD 08/18/18  Yes Charlynne Cousins, MD  MELATONIN PO Take 1 tablet by mouth at bedtime as needed (sleep).   Yes [provider]  morphine (MS CONTIN) 15 MG 12 hr tablet Take 1 tablet (15 mg total) by mouth every 12 (twelve) hours.  08/18/18  Yes Charlynne Cousins, MD  oxyCODONE (ROXICODONE) 15 MG immediate release tablet Take 1 tablet (15 mg total) by mouth every 4 (four) hours as needed for moderate pain or severe pain. 08/18/18  Yes Charlynne Cousins, MD  polyethylene glycol [facility] Floria Raveling) packet Take 17 g by mouth 2 (two) times daily. 08/18/18  Yes Charlynne Cousins, MD    Family History History reviewed. No pertinent family history.  Social History Social History   Tobacco Use  . Smoking status: Current Every Day Smoker    Types: Cigarettes  . Smokeless tobacco: Never Used  Substance Use Topics  . Alcohol use: No  . Drug use: No     Allergies     Oxycodone-acetaminophen and Other   Review of Systems Review of Systems  Constitutional: Negative for fever.  HENT: Negative for sore throat.   Eyes: Negative for visual disturbance.  Respiratory: Negative for shortness of breath.   Cardiovascular: Negative for chest pain.  Gastrointestinal: Negative for abdominal pain, constipation, diarrhea, nausea and vomiting.  Genitourinary: Negative for dysuria and hematuria.  Musculoskeletal: Positive for back pain.  Skin: Negative for color change and rash.  Neurological: Positive for weakness. Negative for numbness.  All other systems reviewed and are negative.    Physical Exam Updated Vital Signs BP 113/64   Pulse 91   Temp (!) 97.4 F (36.3 C) (Oral)   Resp 19   Ht 5\' 2"  (1.575 m)   Wt 54.4 kg   SpO2 96%   BMI 21.95 kg/m   Physical Exam Vitals signs and nursing note reviewed.  Constitutional:      General: She is not in acute distress.    Appearance: She is well-developed.  HENT:     Head: Normocephalic and atraumatic.     Mouth/Throat:     Mouth: Mucous membranes are dry.  Eyes:     Conjunctiva/sclera: Conjunctivae normal.  Neck:     Musculoskeletal: Neck supple.  Cardiovascular:     Rate and Rhythm: Normal rate and regular rhythm.     Heart sounds: Normal heart sounds. No murmur.  Pulmonary:     Effort: Pulmonary effort is normal.     Comments: Normal breath sounds anteriorly and laterally, speaking in full sentences. Abdominal:     General: Bowel sounds are normal.     Palpations: Abdomen is soft.     Tenderness: There is no abdominal tenderness.  Musculoskeletal:     Right lower leg: No edema.     Left lower leg: No edema.  Skin:    General: Skin is warm and dry.  Neurological:     Mental Status: She is alert.     Comments: Exam not able to be fully completed as patient has severe muscle spasms when moving her bilateral lower extremities so unable to assess strength or range of motion of the bilateral  lower extremities.  She does have 5/5 strength to the right upper extremity.  3/5 strength of the left upper extremity which she states is been ongoing for several months.  Normal sensation to the bilateral lower extremities.  Normal distal pulses.  Able to wiggle toes and move feet.  Psychiatric:     Comments: Anxious, tearful      ED Treatments / Results  Labs (all labs ordered are listed, but only abnormal results are displayed) Labs Reviewed  CBC WITH DIFFERENTIAL/PLATELET - Abnormal; Notable for the following components:      Result Value  WBC 14.8 (*)    RBC 5.35 (*)    Hemoglobin 15.4 (*)    HCT 46.8 (*)    RDW 17.2 (*)    Neutro Abs 12.5 (*)    Monocytes Absolute 1.1 (*)    Abs Immature Granulocytes 0.48 (*)    All other components within normal limits  BASIC METABOLIC PANEL - Abnormal; Notable for the following components:   Sodium 134 (*)    Glucose, Bld 146 (*)    BUN 41 (*)    Calcium 8.6 (*)    All other components within normal limits  URINALYSIS, ROUTINE W REFLEX MICROSCOPIC    EKG None  Radiology No results found.  Procedures Procedures (including critical care time)  Medications Ordered in ED Medications  HYDROmorphone (DILAUDID) injection 1 mg (1 mg Intravenous Given 08/19/18 2029)  ondansetron (ZOFRAN) injection 4 mg (4 mg Intravenous Given 08/19/18 2029)  HYDROmorphone (DILAUDID) injection 1 mg (1 mg Intravenous Given 08/19/18 2117)  cyclobenzaprine (FLEXERIL) tablet 10 mg (10 mg Oral Given 08/19/18 2117)  sodium chloride 0.9 % bolus 500 mL (0 mLs Intravenous Stopped 08/19/18 2154)  morphine 4 MG/ML injection 4 mg (4 mg Intravenous Given 08/19/18 2245)     Initial Impression / Assessment and Plan / ED Course  I have reviewed the triage vital signs and the nursing notes.  Pertinent labs & imaging results that were available during my care of the patient were reviewed by me and considered in my medical decision making (see chart for details).   Final  Clinical Impressions(s) / ED Diagnoses   Final diagnoses:  Back pain, unspecified back location, unspecified back pain laterality, unspecified chronicity  Metastasis to spinal column [facility])   63 year old female with breast cancer with known mets to the spine with multiple pathologic fractures.  Was recently admitted and discharged yesterday.  During her admission she was evaluated by PT and was recommended for SNF placement however she was discharged home instead.  Has been using a wheelchair at home but has been unable to walk or complete her daily activities.  Today the pain become intolerable despite her home medications.  He is unable to participate in the exam today secondary to muscle spasms and pain.  She has no numbness to the bilateral lower extremities.  She has no urinary retention.  She has had no urinary incontinence.  No bowel incontinence.  No signs of cauda equina at this time.  She denies any falls or trauma that could have led to new injury.  Feel that patient will require admission for pain control and further treatment given that she has failed her outpatient regimen. Pt accepted for admission to hospitalist service.   ED Discharge Orders    None       Bishop Dublin 08/20/18 0003    Tegeler, Gwenyth Allegra, MD 08/20/18 [phone]

## 2018-08-19 NOTE — ED Notes (Signed)
Bed: PN36 Expected date:  Expected time:  Means of arrival:  Comments: [facility]

## 2018-08-20 ENCOUNTER — Ambulatory Visit
Admission: RE | Admit: 2018-08-20 | Discharge: 2018-08-20 | Disposition: A | Discharge status: Home / Self Care | Payer: Self-pay | Source: Ambulatory Visit | Attending: Radiation Oncology | Admitting: Radiation Oncology

## 2018-08-20 DIAGNOSIS — M549 Dorsalgia, unspecified: Secondary | ICD-10-CM

## 2018-08-20 DIAGNOSIS — M79604 Pain in right leg: Secondary | ICD-10-CM

## 2018-08-20 DIAGNOSIS — C50919 Malignant neoplasm of unspecified site of unspecified female breast: Secondary | ICD-10-CM

## 2018-08-20 DIAGNOSIS — C7951 Secondary malignant neoplasm of bone: Principal | ICD-10-CM

## 2018-08-20 DIAGNOSIS — M79605 Pain in left leg: Secondary | ICD-10-CM

## 2018-08-20 DIAGNOSIS — G893 Neoplasm related pain (acute) (chronic): Secondary | ICD-10-CM

## 2018-08-20 DIAGNOSIS — R29898 Other symptoms and signs involving the musculoskeletal system: Secondary | ICD-10-CM

## 2018-08-20 LAB — CBC
HCT: 42.8 % (ref 36.0–46.0)
Hemoglobin: 13.6 g/dL (ref 12.0–15.0)
MCH: 28.8 pg (ref 26.0–34.0)
MCHC: 31.8 g/dL (ref 30.0–36.0)
MCV: 90.5 fL (ref 80.0–100.0)
Platelets: 173 10*3/uL (ref 150–400)
RBC: 4.73 MIL/uL (ref 3.87–5.11)
RDW: 16.9 % — ABNORMAL HIGH (ref 11.5–15.5)
WBC: 10.4 10*3/uL (ref 4.0–10.5)
nRBC: 0.2 % (ref 0.0–0.2)

## 2018-08-20 LAB — CREATININE, SERUM
Creatinine, Ser: 0.51 mg/dL (ref 0.44–1.00)
GFR calc Af Amer: >60 mL/min (ref >60)
GFR calc non Af Amer: >60 mL/min (ref >60)

## 2018-08-20 LAB — HIV ANTIBODY (ROUTINE TESTING W REFLEX): HIV SCREEN 4TH GENERATION: NONREACTIVE

## 2018-08-20 LAB — PHOSPHORUS: Phosphorus: 2.9 mg/dL (ref 2.5–4.6)

## 2018-08-20 LAB — MAGNESIUM: Magnesium: 2.2 mg/dL (ref 1.7–2.4)

## 2018-08-20 MED ORDER — CYCLOBENZAPRINE HCL 10 MG PO TABS
5.0000 mg | ORAL_TABLET | Freq: Three times a day (TID) | ORAL | Status: DC | PRN
  Administered 2018-08-20 – 2018-08-22 (×3): 5 mg via ORAL
  Filled 2018-08-20 (×3): qty 1

## 2018-08-20 MED ORDER — ACETAMINOPHEN 650 MG RE SUPP: 650.0000 mg | Freq: Four times a day (QID) | RECTAL | Status: DC | PRN

## 2018-08-20 MED ORDER — ONDANSETRON HCL 4 MG/2ML IJ SOLN
4.0000 mg | Freq: Four times a day (QID) | INTRAMUSCULAR | Status: DC | PRN
  Administered 2018-08-23 – 2018-09-10 (×3): 4 mg via INTRAVENOUS
  Filled 2018-08-20 (×3): qty 2

## 2018-08-20 MED ORDER — OXYCODONE HCL 5 MG PO TABS
15.0000 mg | ORAL_TABLET | ORAL | Status: DC | PRN
  Administered 2018-08-20 – 2018-09-10 (×22): 15 mg via ORAL
  Filled 2018-08-20 (×24): qty 3

## 2018-08-20 MED ORDER — GABAPENTIN 100 MG PO CAPS
100.0000 mg | ORAL_CAPSULE | Freq: Every day | ORAL | Status: DC
  Administered 2018-08-20 – 2018-09-11 (×22): 100 mg via ORAL
  Filled 2018-08-20 (×25): qty 1

## 2018-08-20 MED ORDER — ONDANSETRON HCL 4 MG PO TABS
4.0000 mg | ORAL_TABLET | Freq: Four times a day (QID) | ORAL | Status: DC | PRN
  Administered 2018-08-27: 4 mg via ORAL
  Filled 2018-08-20 (×2): qty 1

## 2018-08-20 MED ORDER — MORPHINE SULFATE ER 15 MG PO TBCR
15.0000 mg | EXTENDED_RELEASE_TABLET | Freq: Two times a day (BID) | ORAL | Status: DC
  Administered 2018-08-20 – 2018-08-21 (×4): 15 mg via ORAL
  Filled 2018-08-20 (×4): qty 1

## 2018-08-20 MED ORDER — FLEET ENEMA 7-19 GM/118ML RE ENEM: 1.0000 | ENEMA | Freq: Once | RECTAL | Status: DC | PRN

## 2018-08-20 MED ORDER — ENOXAPARIN SODIUM 40 MG/0.4ML ~~LOC~~ SOLN
40.0000 mg | SUBCUTANEOUS | Status: DC
  Administered 2018-08-20 – 2018-08-28 (×9): 40 mg via SUBCUTANEOUS
  Filled 2018-08-20 (×9): qty 0.4

## 2018-08-20 MED ORDER — KETOROLAC TROMETHAMINE 30 MG/ML IJ SOLN
30.0000 mg | Freq: Four times a day (QID) | INTRAMUSCULAR | Status: AC | PRN
  Administered 2018-08-20 – 2018-08-23 (×5): 30 mg via INTRAVENOUS
  Filled 2018-08-20 (×6): qty 1

## 2018-08-20 MED ORDER — ENSURE ENLIVE PO LIQD
237.0000 mL | Freq: Two times a day (BID) | ORAL | Status: DC
  Administered 2018-08-20 – 2018-09-10 (×17): 237 mL via ORAL

## 2018-08-20 MED ORDER — HYDROMORPHONE HCL 1 MG/ML IJ SOLN
1.0000 mg | INTRAMUSCULAR | Status: DC | PRN
  Administered 2018-08-20 – 2018-08-21 (×4): 1 mg via INTRAVENOUS
  Filled 2018-08-20 (×4): qty 1

## 2018-08-20 MED ORDER — DEXAMETHASONE 4 MG PO TABS
8.0000 mg | ORAL_TABLET | Freq: Three times a day (TID) | ORAL | Status: DC
  Administered 2018-08-20 – 2018-08-29 (×28): 8 mg via ORAL
  Filled 2018-08-20 (×27): qty 2

## 2018-08-20 MED ORDER — BISACODYL 5 MG PO TBEC: 5.0000 mg | DELAYED_RELEASE_TABLET | Freq: Every day | ORAL | Status: DC | PRN

## 2018-08-20 MED ORDER — OXYCODONE HCL 5 MG PO TABS: 15.0000 mg | ORAL_TABLET | ORAL | Status: DC | PRN

## 2018-08-20 MED ORDER — FAMOTIDINE 20 MG PO TABS
20.0000 mg | ORAL_TABLET | Freq: Two times a day (BID) | ORAL | Status: DC
  Administered 2018-08-20 – 2018-09-13 (×47): 20 mg via ORAL
  Filled 2018-08-20 (×49): qty 1

## 2018-08-20 MED ORDER — ENOXAPARIN SODIUM 40 MG/0.4ML ~~LOC~~ SOLN: 40.0000 mg | SUBCUTANEOUS | Status: DC

## 2018-08-20 MED ORDER — POLYETHYLENE GLYCOL 3350 17 G PO PACK
17.0000 g | PACK | Freq: Two times a day (BID) | ORAL | Status: DC
  Administered 2018-08-20 – 2018-09-11 (×13): 17 g via ORAL
  Filled 2018-08-20 (×30): qty 1

## 2018-08-20 MED ORDER — ACETAMINOPHEN 325 MG PO TABS: 650.0000 mg | ORAL_TABLET | Freq: Four times a day (QID) | ORAL | Status: DC | PRN

## 2018-08-20 MED ORDER — DULOXETINE HCL 20 MG PO CPEP
20.0000 mg | ORAL_CAPSULE | Freq: Every day | ORAL | Status: DC
  Administered 2018-08-21 – 2018-08-23 (×3): 20 mg via ORAL
  Filled 2018-08-20 (×3): qty 1

## 2018-08-20 NOTE — ED Notes (Signed)
Per Ubaldo Glassing in the lab, lab to add on magnesium and phosphorus labs

## 2018-08-20 NOTE — Progress Notes (Signed)
[facility] Therapy Treatment Record Phone [phone]   Radiation Therapy was administered to Kristina Mcfarland on: 10/15/3830  4:22 PM and was treatment # 6 out of a planned course of 10 treatments.  Radiation Treatment  1). Beam photons with 6-10 energy  2). Brachytherapy None  3). Stereotactic Radiosurgery None  4). Other Radiation None     Amie Cowens J Thinh Cuccaro, RT

## 2018-08-20 NOTE — Progress Notes (Signed)
Kristina Mcfarland   LDJ:[birth year]   TJ#:[id number]   QTM#:[id number]  Oncology follow-up note  Subjective: Patient is well-known to me, was diagnosed with metastatic breast cancer on last admission.  She was readmitted to hospital 1 day after discharge, due to intractable back pain.  She describes as muscle spasm, especially when she changes position.  Her legs have being very weak, she is not able to walk.  She has been tolerating radiation well.   Objective:  Vitals:   08/20/18 0437 08/20/18 1443  BP: 110/75 124/79  Pulse: 85 98  Resp: 18 16  Temp: 97.8 F (36.6 C) 97.9 F (36.6 C)  SpO2: 97% 98%    Body mass index is 23.02 kg/m.  Intake/Output Summary (Last 24 hours) at 08/20/2018 1700 Last data filed at 08/19/2018 2154 Gross per 24 hour  Intake 499.98 ml  Output -  Net 499.98 ml     Sclerae unicteric  Oropharynx clear  No peripheral adenopathy  Lungs clear -- no rales or rhonchi  Heart regular rate and rhythm  Abdomen benign  MSK no focal spinal tenderness, no peripheral edema  Neuro nonfocal   CBG (last 3)  No results for input(s): GLUCAP in the last 72 hours.   Labs:  Lab Results  Component Value Date   WBC 10.4 08/20/2018   HGB 13.6 08/20/2018   HCT 42.8 08/20/2018   MCV 90.5 08/20/2018   PLT 173 08/20/2018   NEUTROABS 12.5 (H) 08/19/2018   CMP Latest Ref Rng & Units 08/20/2018 08/19/2018 08/14/2018  Glucose 70 - 99 mg/dL - 146(H) 152(H)  BUN 8 - 23 mg/dL - 41(H) 25(H)  Creatinine 0.44 - 1.00 mg/dL 0.51 0.46 0.41(L)  Sodium 135 - 145 mmol/L - 134(L) 136  Potassium 3.5 - 5.1 mmol/L - 4.2 4.1  Chloride 98 - 111 mmol/L - 100 104  CO2 22 - 32 mmol/L - 23 23  Calcium 8.9 - 10.3 mg/dL - 8.6(L) 8.5(L)  Total Protein 6.5 - 8.1 g/dL - - 5.9(L)  Total Bilirubin 0.3 - 1.2 mg/dL - - 1.2  Alkaline Phos 38 - 126 U/L - - 127(H)  AST 15 - 41 U/L - - 51(H)  ALT 0 - 44 U/L - - 45(H)     Urine Studies No results for input(s): UHGB, CRYS in the last 72 hours.  Invalid  input(s): UACOL, UAPR, USPG, UPH, UTP, UGL, UKET, UBIL, UNIT, UROB, ULEU, UEPI, UWBC, URBC, UBAC, CAST, [place], [place]  Basic Metabolic Panel: Recent Labs  Lab 08/14/18 0358 08/19/18 2031 08/19/18 2032 08/20/18 0625  NA 136  --  134*  --   K 4.1  --  4.2  --   CL 104  --  100  --   CO2 23  --  23  --   GLUCOSE 152*  --  146*  --   BUN 25*  --  41*  --   CREATININE 0.41*  --  0.46 0.51  CALCIUM 8.5*  --  8.6*  --   MG 2.3 2.2  --   --   PHOS  --  2.9  --   --    GFR Estimated Creatinine Clearance: 57.7 mL/min (by C-G formula based on SCr of 0.51 mg/dL). Liver Function Tests: Recent Labs  Lab 08/14/18 0358  AST 51*  ALT 45*  ALKPHOS 127*  BILITOT 1.2  PROT 5.9*  ALBUMIN 3.3*   No results for input(s): LIPASE, AMYLASE in the last 168 hours. No results for input(s): AMMONIA  in the last 168 hours. Coagulation profile No results for input(s): INR, PROTIME in the last 168 hours.  CBC: Recent Labs  Lab 08/14/18 0358 08/19/18 2032 08/20/18 0625  WBC 11.2* 14.8* 10.4  NEUTROABS 9.7* 12.5*  --   HGB 14.6 15.4* 13.6  HCT 44.7 46.8* 42.8  MCV 86.1 87.5 90.5  PLT 246 207 173   Cardiac Enzymes: No results for input(s): CKTOTAL, CKMB, CKMBINDEX, TROPONINI in the last 168 hours. BNP: Invalid input(s): POCBNP CBG: No results for input(s): GLUCAP in the last 168 hours. D-Dimer No results for input(s): DDIMER in the last 72 hours. Hgb A1c No results for input(s): HGBA1C in the last 72 hours. Lipid Profile No results for input(s): CHOL, HDL, LDLCALC, TRIG, CHOLHDL, LDLDIRECT in the last 72 hours. Thyroid function studies No results for input(s): TSH, T4TOTAL, T3FREE, THYROIDAB in the last 72 hours.  Invalid input(s): FREET3 Anemia work up No results for input(s): VITAMINB12, FOLATE, FERRITIN, TIBC, IRON, RETICCTPCT in the last 72 hours. Microbiology No results found for this or any previous visit (from the past 240 hour(s)).    Studies:  No results  found.  Assessment: 63 y.o. postmenopausal Caucasian female, with past medical history of SVT, palpable left breast mass 4 months ago, presented with severe low back and bilateral hip pain  1. Diffuse metastatic breast cancer to bones, ER+/PR+/HER2 pending  2.Severe back pain and low extremity weakness, secondary to #1 3. History of SVT 4. Deconditioning    Plan:  -I have discussed her recent bone biopsy pathology findings with patient, and give her a copy of the report.  Biopsy confirmed metastatic adenocarcinoma, consistent with breast primary, both ER and PR positive.  HER-2 is still pending.  I have called pathology lab regarding HER-2. -We discussed the overall prognosis of metastatic breast cancer.  Although is not curable, it is very treatable.  -If HER-2 negative, I have started her on antiestrogen therapy with letrozole, and CDK4/6 inhibitor, such as Ibrance.  If HER-2 positive, I will start her on chemotherapy Taxol and anti-HER-2 therapy. -She is scheduled to complete palliative radiation next Monday. -I encouraged her to consider rehab after hospital discharge. -I will start her on Zometa infusion every 3 month after she completes radiation, likely when she returns to my clinic for follow up -She is currently on MS Contin 50 mg twice daily, and oxycodone as needed.  She is also on muscle relaxants and dexa. Due to her depression/anxiety and some neurogenic pain, I will add her on Cymbalta also.  I will defer dexamethasone tapering to radiation oncology. -I will f/u   Truitt Merle, MD 08/20/2018  5:00 PM

## 2018-08-20 NOTE — Evaluation (Signed)
Physical Therapy Evaluation Patient Details Name: Kristina Kristina Mcfarland MRN: [id number] DOB: 07-13-1956 Today's Date: 08/20/2018   History of Present Illness  Pt is a 63 yo female with diagnosis of breast cancer stage IV admitted to ED on 1/6 for pain. Pt recently admitted on 12/26 for LBP with R leg radiations x 1 month. Pt also with L arm numbness. Imaging revealed cervical and thoracic spine pathologic fractures, and lesions to spine. Other notable PMH includes SVT, back surgery.    Clinical Impression   Pt presents with severe bilateral LE and back pain, LE weakness (unable to fully assess due to pain), . Pt to benefit from acute PT to address deficits. Pt required max assist +1 or +2 to perform mobility tasks, and required mod assist to get to and from [facility]. Pt unable to come to full standing. Pt very upset by physical decline. PT recommending SNF placement to address mobility deficits, and due to lack of support in the home to assist her as she needs. PT to progress mobility as tolerated, and will continue to follow acutely.      Follow Up Recommendations SNF;Supervision for mobility/OOB    Equipment Recommendations  Other (comment);None recommended by PT(defer to next setting)    Recommendations for Other Services       Precautions / Restrictions Precautions Precautions: Fall;Back Precaution Comments: Mets and pathologic fractues to C- and T-spine, log rolling technique utilized Restrictions Weight Bearing Restrictions: No      Mobility  Bed Mobility Overal bed mobility: Needs Assistance Bed Mobility: Rolling;Sidelying to Sit;Sit to Supine Rolling: Min assist Sidelying to sit: Max assist   Sit to supine: Max assist;+2 for physical assistance;HOB elevated   General bed mobility comments: Min assist for completion of roll. Max assist for sidelying to sit for trunk elevation and LE management. Pt required very increased time at each step in mobility process due to pt-reported bilateral  LE spasms. Max assist +2 for sit to supine for LE management, trunk lowering, and scooting pt up in bed. Pt moving from sit to supine was very painful for pt.   Transfers Overall transfer level: Needs assistance Equipment used: None Transfers: Stand Pivot Transfers   Stand pivot transfers: Mod assist;+2 safety/equipment       General transfer comment: Mod assist for stand pivot to [facility] for steadying, sequencing and hand placement, power up, and eccentric lowering. Pt's son assisted with steadying pt on transfer to and from [facility]. Pt performed pericare without PT assist.   Ambulation/Gait Ambulation/Gait assistance: (NT - pt with LE weakness and pain not compatible with ambulation today)              Stairs            Wheelchair Mobility    Modified Rankin (Stroke Patients Only)       Balance Overall balance assessment: Needs assistance Sitting-balance support: No upper extremity supported Sitting balance-Leahy Scale: Fair     Standing balance support: No upper extremity supported;Bilateral upper extremity supported Standing balance-Leahy Scale: Poor Standing balance comment: relies on PT and UE support for standing or stand pivot                             Pertinent Vitals/Pain Pain Assessment: Faces Faces Pain Scale: Hurts whole lot Pain Location: bilateral hips and legs, back  Pain Descriptors / Indicators: Aching;Spasm Pain Intervention(s): Limited activity within patient's tolerance;Repositioned;Monitored during session;Patient requesting pain meds-RN notified  Home Living Family/patient expects to be discharged to:: Private residence Living Arrangements: Spouse/significant other;Children;Other relatives Available Help at Discharge: Family;Available PRN/intermittently Type of Home: Apartment Home Access: Stairs to enter   Entrance Stairs-Number of Steps: 2 Home Layout: One level Home Equipment: Walker - 2 wheels;Grab bars -  tub/shower;Wheelchair - manual      Prior Function Level of Independence: Needs assistance   Gait / Transfers Assistance Needed: per pt, she has not been able to ambulate and requires significant assist to get to and from [facility]. Pt has been primarily using w/c for mobility in the home.   ADL's / Homemaking Assistance Needed: Requires assist for ADLs   Comments: Pt states "my legs don't work anymore". Documenting PT evaled pt last hospitalization and per that note, "Pt tearful about home situation, stating her husband is an alcoholic and she was going to leave him but then she got sick, and does not want to leave her 30 year old father-in-law unattended. Pt states she wants her father-in-law to live elsewhere but he refuses to leave."     Hand Dominance   Dominant Hand: Right    Extremity/Trunk Assessment   Upper Extremity Assessment Upper Extremity Assessment: Generalized weakness;LUE deficits/detail LUE Deficits / Details: Pt reports numbness over the plantar portion of her hand    Lower Extremity Assessment Lower Extremity Assessment: RLE deficits/detail;LLE deficits/detail RLE Deficits / Details: Pt able to perform knee extension with 3/5 Kristina Mcfarland, unable to get in other MMT positions or tolerate MMT. Pt required max assist +2 for bed mobility and pt unable to come to full standing due to LE weakness.Pt translating LEs to EOB by using toes and ankle movement    RLE: Unable to fully assess due to pain LLE Deficits / Details: Pt able to perform knee extension with 2/5 Kristina Mcfarland, unable to get in other MMT positions or tolerate MMT. Pt required max assist +2 for bed mobility and pt unable to come to full standing due to LE weakness. Pt translating LEs to EOB by using toes and ankle movement   LLE: Unable to fully assess due to pain    Cervical / Trunk Assessment Cervical / Trunk Assessment: Normal  Communication   Communication: No difficulties  Cognition Arousal/Alertness:  Awake/alert Behavior During Therapy: Anxious;Agitated Overall Cognitive Status: Within Functional Limits for tasks assessed                                 General Comments: Pt irritated with PT upon entering room, stating "everyone wants something from me, no one lets me rest". Pt with a couple of crying episodes during session due to prognosis, very difficult to redirect pt and get her focused on mobility.      General Comments      Exercises     Assessment/Plan    PT Assessment Patient needs continued PT services  PT Problem List Decreased Kristina Mcfarland;Pain;Decreased activity tolerance;Decreased knowledge of use of DME;Decreased balance;Decreased safety awareness;Decreased mobility       PT Treatment Interventions DME instruction;Therapeutic activities;Gait training;Therapeutic exercise;Patient/family education;Balance training;Functional mobility training    PT Goals (Current goals can be found in the Care Plan section)  Acute Rehab PT Goals Patient Stated Goal: decrease pain, increase leg Kristina Mcfarland  PT Goal Formulation: With patient Time For Goal Achievement: 09/03/18 Potential to Achieve Goals: Fair    Frequency Min 2X/week   Barriers to discharge Decreased caregiver support      Co-evaluation  AM-PAC PT "6 Clicks" Mobility  Outcome Measure Help needed turning from your back to your side while in a flat bed without using bedrails?: A Lot Help needed moving from lying on your back to sitting on the side of a flat bed without using bedrails?: A Lot Help needed moving to and from a bed to a chair (including a wheelchair)?: A Lot Help needed standing up from a chair using your arms (e.g., wheelchair or bedside chair)?: A Lot Help needed to walk in hospital room?: Total Help needed climbing 3-5 steps with a railing? : Total 6 Click Score: 10    End of Session Equipment Utilized During Treatment: (Pt refused to wear gait belt ) Activity  Tolerance: No increased pain;Patient limited by fatigue Patient left: in bed;with bed alarm set;with call bell/phone within reach;with family/visitor present Nurse Communication: Patient requests pain meds;Mobility status PT Visit Diagnosis: Other abnormalities of gait and mobility (R26.89);Muscle weakness (generalized) (M62.81)    Time: 4975-3005 PT Time Calculation (min) (ACUTE ONLY): 56 min   Charges:   PT Evaluation $PT Eval Low Complexity: 1 Low PT Treatments $Therapeutic Activity: 23-37 mins        Julien Girt, PT Acute Rehabilitation Services Pager [phone]  Office [phone]   Kristina Kristina Mcfarland 08/20/2018, 3:53 PM

## 2018-08-20 NOTE — Progress Notes (Addendum)
PROGRESS NOTE  Kristina Mcfarland PXT:[id number] DOB: 1956-06-26 DOA: 08/19/2018 PCP: Willeen Niece, PA  Brief History   63 year old woman PMH metastatic breast cancer with extensive involvement of the cervical, thoracic and lumbar spine, diagnosed late December 2019, currently receiving chemotherapy, presented with worsening back pain, leg spasms, inability to ambulate.    Discharged 1/5 after being evaluated for new diagnosis of metastatic breast cancer, cancer related pain.  SNF was recommended but could not be pursued apparently secondary to insurance considerations per  A & P  Severe back and lower extremity pain bilaterally with spasms, inability to ambulate or stand and transfer secondary to diffuse metastatic disease to the cervical, thoracic and lumbar spine. --Able to move both legs, no symptoms of cauda equina syndrome.  There did not appear to be any new issues from her recent hospitalization.   --Currently undergoing radiation therapy with next treatment scheduled for this afternoon. --Will adjust analgesics.  Continue Flexeril for spasms. --Consult palliative medicine for assistance with symptom management and goals of care. --I have notified the patient's oncologist and requested further recommendations. --Will briefly discussed with neurosurgery, doubt anything to offer from palliative perspective.--Addendum discussed with neurosurgery.  No activity restrictions.  Continue radiation therapy.   Treat pain and spasms.  Continue radiation therapy.  Will involve physical therapy.  Not able to be cared for at home.  Will need SNF.  DVT prophylaxis: enoxaparin Code Status: Full Family Communication: none Disposition Plan: likely SNF    Murray Hodgkins, MD  [facility] Direct contact: [phone] --Via amion app OR  --[email or web address]; password [email or web address]  7PM-7AM contact night coverage as above 08/20/2018, 11:54 AM  LOS: 0 days   Consultants  . Oncology notified  Procedures   .   Antibiotics  .   Interval History/Subjective  Severe spasms bilateral lower extremities.  Ongoing pain in back.  Miserable.  No bowel or bladder dysfunction.  No sacral dysesthesia.  No lower extremity paresthesias.  Too painful to move her legs.  Moving legs results in severe spasm.  Objective   Vitals:  Vitals:   08/20/18 0330 08/20/18 0437  BP: 100/76 110/75  Pulse: 90 85  Resp: 18 18  Temp:  97.8 F (36.6 C)  SpO2: 96% 97%    Exam:  Constitutional:  . Appears anxious, scared, uncomfortable, in pain, nontoxic. Eyes:  . pupils and irises appear normal . Normal lids  ENMT:  . grossly normal hearing  Respiratory:  . CTA bilaterally, no w/r/r.  . Respiratory effort normal.  Cardiovascular:  . RRR, no m/r/g . No LE extremity edema   Abdomen:  . Soft, nontender, nondistended Musculoskeletal:  . Digits/nails BUE: no clubbing, cyanosis, petechiae, infection . Right upper extremity strength appears 5/5.  Left upper extremity strength appears 5/5 except for grip strength which is 4-/5. Marland Kitchen Right lower extremity strength probably is normal she is able to extend her knee and move her foot although with pain. . Left lower extremity strength is difficult to assess, she keeps the knee and hip flexed and has severe pain and spasm with attempted movement.  She is able to move the foot. Skin:  . No rashes, lesions, ulcers Neurologic:  . Sensation in both legs intact. Psychiatric:  . Mental status o Mood appears appropriate, stable, sad; affect anxious  I have personally reviewed the following:   Today's Data  . Hemoglobin now within normal limits, 10.4.  Remainder CBC notable only for slight decrease in platelet count, 173.  Lab Data  .  Basic metabolic panel on admission was unremarkable.  Magnesium and phosphorus were within normal limits.  Micro Data  .   Imaging  . No new imaging. Marland Kitchen MRI cervical, thoracic and lumbar spine from December noted.  Cardiology Data   .   Other Data  .   Scheduled Meds: . dexamethasone  8 mg Oral Q8H  . enoxaparin (LOVENOX) injection  40 mg Subcutaneous Q24H  . famotidine  20 mg Oral BID  . feeding supplement (ENSURE ENLIVE)  237 mL Oral BID BM  . gabapentin  100 mg Oral QHS  . morphine  15 mg Oral Q12H  . polyethylene glycol  17 g Oral BID   Continuous Infusions:  Principal Problem:   Metastatic breast cancer (HCC) Active Problems:   Intractable back pain   Weakness of both lower extremities   LOS: 0 days     Time approximately 5436-0677.  Review of chart, interview and examination of patient, discussion of treatment plan, discussion with office staff for oncologist, neurosurgeon.

## 2018-08-20 NOTE — H&P (Signed)
H&P        History and Physical    Kristina Mcfarland ZES:[id number] DOB: April 26, 1956 DOA: 08/19/2018  PCP: Willeen Niece, PA  Patient coming from: home  I have personally briefly reviewed patient's old medical records in [place]  Chief Complaint: back pain and weakness  HPI: Kristina Mcfarland is a 63 y.o. female with medical history significant of recently diagnosed breast cancer with spinal mets presents with worsening back pain and leg weakness.  Patient was diagnosed with metastatic breast cancer in December.  She has since undergone radiation numerous times this week.  She lives at home with her son who also has back trouble and they are having trouble taking care of her.  She complains of severe leg spasms and weakness.  She cannot get up on her own to even use the bedside commode without full assistance.  She denies any bowel or urine incontinence.  She denies any fever cough.  She has tried to maintain her pain on the scheduled long-acting morphine and oxycodone but they have not relieved her leg cramps as she describes .she has been hemodynamically stable here.  She does have a white count of 14,000 but was discharge on Decadron. ED Course: Patient was given a few doses of IV Dilaudid for pain control , we were asked to admit for intractable back pain.  Review of Systems: Denies chest pain shortness breath fever cough nausea vomiting  All others reviewed with patient  and are  negative unless otherwise stated    Past Medical History:  Diagnosis Date  . Breast cancer ([place]) 2019  . SVT (supraventricular tachycardia) (HCC)     Past Surgical History:  Procedure Laterality Date  . BACK SURGERY    . CARPAL TUNNEL RELEASE Bilateral   . CHOLECYSTECTOMY    . TUBAL LIGATION       reports that she has been smoking cigarettes. She has never used smokeless tobacco. She reports that she does not drink alcohol or use drugs.  Allergies  Allergen Reactions  . Oxycodone-Acetaminophen Nausea  And Vomiting  . Other     Medication for SVT, Anderal?    History reviewed. No pertinent family history. No known breast cancer  Prior to Admission medications   Medication Sig Start Date End Date Taking? Authorizing Provider  Aspirin-Salicylamide-Caffeine (ARTHRITIS STRENGTH BC POWDER PO) Take 1 Package by mouth daily as needed (arthritis pain).   Yes [provider]  dexamethasone (DECADRON) 4 MG tablet Take 2 tablets (8 mg total) by mouth every 8 (eight) hours. 08/18/18  Yes Charlynne Cousins, MD  famotidine (PEPCID) 20 MG tablet Take 1 tablet (20 mg total) by mouth 2 (two) times daily. 08/18/18  Yes Charlynne Cousins, MD  gabapentin (NEURONTIN) 100 MG capsule Take 1 capsule (100 mg total) by mouth at bedtime. 08/18/18  Yes Charlynne Cousins, MD  lidocaine (LIDODERM) 5 % Place 1 patch onto the skin daily. Remove & Discard patch within 12 hours or as directed by MD 08/18/18  Yes Charlynne Cousins, MD  MELATONIN PO Take 1 tablet by mouth at bedtime as needed (sleep).   Yes [provider]  morphine (MS CONTIN) 15 MG 12 hr tablet Take 1 tablet (15 mg total) by mouth every 12 (twelve) hours. 08/18/18  Yes Charlynne Cousins, MD  oxyCODONE (ROXICODONE) 15 MG immediate release tablet Take 1 tablet (15 mg total) by mouth every 4 (four) hours as needed for moderate pain or severe pain. 08/18/18  Yes Charlynne Cousins, MD  polyethylene glycol [facility] / Floria Raveling) packet Take 17 g by mouth 2 (two) times daily. 08/18/18  Yes Charlynne Cousins, MD    Physical Exam: Vitals:   08/20/18 0000 08/20/18 0030 08/20/18 0100 08/20/18 0130  BP: (!) 121/94 123/74 101/71 110/68  Pulse: 97 94 81 89  Resp: 18 17 18 20   Temp:      TempSrc:      SpO2: 99% 98% 97% 97%  Weight:      Height:        Constitutional: NAD,laying on her left side appears uncomfortable Vitals:   08/20/18 0000 08/20/18 0030 08/20/18 0100 08/20/18 0130  BP: (!) 121/94 123/74 101/71 110/68  Pulse: 97 94 81  89  Resp: 18 17 18 20   Temp:      TempSrc:      SpO2: 99% 98% 97% 97%  Weight:      Height:       Eyes: PERRL, lids and conjunctivae normal ENMT: Mucous membranes are moist. Posterior pharynx clear of any exudate or lesions. Neck: normal, supple, Respiratory: clear to auscultation bilaterally, no wheezing, no crackles. Normal respiratory effort. No accessory muscle use.  Cardiovascular: Regular rate and rhythm, no murmurs / rubs / gallops. No extremity edema. 1+ pedal pulses. .  Abdomen: no tenderness, no masses palpated.  Bowel sounds positive.  Musculoskeletal: Lower lumbar spine tender to palpation crease lower extremity muscle tone Skin: no rashes, lesions, ulcers. No induration Neurologic: CN 2-12 grossly intact.  Left upper extremity 4/5strength chronically.  Unable to fully assess lower extremity strength due to discomfort but appears at least 3 out of 5 lower extremity strength Psychiatric: Normal judgment and insight. Alert and oriented x 3. Normal mood.    Labs on Admission: I have personally reviewed following labs and imaging studies  CBC: Recent Labs  Lab 08/14/18 0358 08/19/18 2032  WBC 11.2* 14.8*  NEUTROABS 9.7* 12.5*  HGB 14.6 15.4*  HCT 44.7 46.8*  MCV 86.1 87.5  PLT 246 967   Basic Metabolic Panel: Recent Labs  Lab 08/14/18 0358 08/19/18 2032  NA 136 134*  K 4.1 4.2  CL 104 100  CO2 23 23  GLUCOSE 152* 146*  BUN 25* 41*  CREATININE 0.41* 0.46  CALCIUM 8.5* 8.6*  MG 2.3  --    GFR: Estimated Creatinine Clearance: 57.7 mL/min (by C-G formula based on SCr of 0.46 mg/dL). Liver Function Tests: Recent Labs  Lab 08/14/18 0358  AST 51*  ALT 45*  ALKPHOS 127*  BILITOT 1.2  PROT 5.9*  ALBUMIN 3.3*   No results for input(s): LIPASE, AMYLASE in the last 168 hours. No results for input(s): AMMONIA in the last 168 hours. Coagulation Profile: No results for input(s): INR, PROTIME in the last 168 hours. Cardiac Enzymes: No results for input(s):  CKTOTAL, CKMB, CKMBINDEX, TROPONINI in the last 168 hours. BNP (last 3 results) No results for input(s): PROBNP in the last 8760 hours. HbA1C: No results for input(s): HGBA1C in the last 72 hours. CBG: No results for input(s): GLUCAP in the last 168 hours. Lipid Profile: No results for input(s): CHOL, HDL, LDLCALC, TRIG, CHOLHDL, LDLDIRECT in the last 72 hours. Thyroid Function Tests: No results for input(s): TSH, T4TOTAL, FREET4, T3FREE, THYROIDAB in the last 72 hours. Anemia Panel: No results for input(s): VITAMINB12, FOLATE, FERRITIN, TIBC, IRON, RETICCTPCT in the last 72 hours. Urine analysis: No results found for: COLORURINE, APPEARANCEUR, LABSPEC, [place], GLUCOSEU, HGBUR, BILIRUBINUR, KETONESUR, PROTEINUR, UROBILINOGEN, NITRITE,  LEUKOCYTESUR  Radiological Exams on Admission: No results found.   Assessment/Plan Principal Problem:   Metastatic breast cancer (HCC) Active Problems:   Intractable back pain   Weakness of both lower extremities Leukocytosis  -Observation admission to medical bed.  Continue on long-acting morphine  And prn oxycodone . Will ass with breakthrough Toradol.  We will add a antispasmodic as needed as well.  Consider pain management consultation. Cont decadron -Suspect leukocytosis from demargination from steroids.  However urinalysis pending, follow.  -Patient is scheduled for radiation tomorrow that will need to be continued here. Suspect patient will need placement will consult physical therapy and case management    DVT prophylaxis: Lovenox Code Status: full  Family Communication: son at bedside  Disposition Plan: STR Admission status: Obs med   Shelbie Proctor MD [facility] Pager [phone]  If 7PM-7AM, please contact night-coverage [email or web address] Password [email or web address]  08/20/2018, 2:11 AM

## 2018-08-21 ENCOUNTER — Ambulatory Visit
Admit: 2018-08-21 | Discharge: 2018-08-21 | Disposition: A | Discharge status: Home / Self Care | Payer: Self-pay | Attending: Radiation Oncology | Admitting: Radiation Oncology

## 2018-08-21 DIAGNOSIS — Z515 Encounter for palliative care: Secondary | ICD-10-CM

## 2018-08-21 DIAGNOSIS — Z7189 Other specified counseling: Secondary | ICD-10-CM

## 2018-08-21 LAB — URINALYSIS, ROUTINE W REFLEX MICROSCOPIC
Bilirubin Urine: NEGATIVE
Glucose, UA: >=500 mg/dL — AB
Hgb urine dipstick: NEGATIVE
Ketones, ur: 5 mg/dL — AB
Leukocytes, UA: NEGATIVE
Nitrite: NEGATIVE
PROTEIN: NEGATIVE mg/dL
Specific Gravity, Urine: 1.025 (ref 1.005–1.030)
pH: 6 (ref 5.0–8.0)

## 2018-08-21 MED ORDER — LIDOCAINE 5 % EX PTCH
1.0000 | MEDICATED_PATCH | CUTANEOUS | Status: DC
  Administered 2018-08-21 – 2018-09-13 (×21): 1 via TRANSDERMAL
  Filled 2018-08-21 (×25): qty 1

## 2018-08-21 MED ORDER — SENNOSIDES-DOCUSATE SODIUM 8.6-50 MG PO TABS
2.0000 | ORAL_TABLET | Freq: Two times a day (BID) | ORAL | Status: DC
  Administered 2018-08-23 – 2018-09-11 (×20): 2 via ORAL
  Filled 2018-08-21 (×33): qty 2

## 2018-08-21 MED ORDER — HYDROMORPHONE HCL 1 MG/ML IJ SOLN
0.5000 mg | INTRAMUSCULAR | Status: DC | PRN
  Administered 2018-08-22 – 2018-09-11 (×14): 0.5 mg via INTRAVENOUS
  Filled 2018-08-21 (×16): qty 1

## 2018-08-21 NOTE — Progress Notes (Signed)
Pt. left via bed to Radiation. Pt. alert and oriented x 4. Premedicated with Dilaudid 1 mg IV as any movement is extremely painful. No respiratory distress noted. MD notified to request pain patch and possible anti anxiety as pt. is very anxious with any movement and begins to cry very easily, appears emotional.

## 2018-08-21 NOTE — Progress Notes (Signed)
PROGRESS NOTE  Kristina Mcfarland BZJ:[id number] DOB: February 06, 1956 DOA: 08/19/2018 PCP: Willeen Niece, PA  HPI/Recap of past 57 hours: 63 year old woman PMH metastatic breast cancer with extensive involvement of the cervical, thoracic and lumbar spine, diagnosed late December 2019, currently receiving chemotherapy, presented with worsening back pain, leg spasms, inability to ambulate.    Discharged 1/5 after being evaluated for new diagnosis of metastatic breast cancer, cancer related pain.  SNF was recommended but could not be pursued apparently secondary to insurance.  08/21/2018: Patient seen and examined at bedside.  No acute events overnight.  Reports intermittent back pain which is well controlled on current pain medications.  Ongoing radiation therapy.  Patient will need SNF at discharge per PT recommendation.  CSW has been consulted.  Assessment/Plan: Principal Problem:   Metastatic breast cancer ([place]) Active Problems:   Intractable back pain   Weakness of both lower extremities  Intractable back and lower extremity pain secondary to mets to bones in the setting of breast cancer Patient is currently receiving chemotherapy and ongoing radiation therapy Dr. Burr Medico is following Pain management and bowel regimen in place Fall precautions Dependent reviewed MRI of spine which revealed mets affecting cervical, thoracic and lumbar regions Palliative care team consulted to assist with symptoms management and goals of care  Ambulatory dysfunction PT recommends SNF CSW consulted to assist with placement Fall precautions  Pathological fracture in the setting of breast cancer with mets to bone Fall precautions Ongoing radiation therapy Oncology following Pain management     DVT prophylaxis:  Subcu Lovenox daily  Code Status: Full Family Communication:  None at bedside Disposition Plan: SNF when hemodynamically stable    Objective: Vitals:   08/20/18 0437 08/20/18 1443 08/20/18  2057 08/21/18 0558  BP: 110/75 124/79 128/84 129/80  Pulse: 85 98 99 93  Resp: 18 16 16 16   Temp: 97.8 F (36.6 C) 97.9 F (36.6 C) 98.1 F (36.7 C) 98.7 F (37.1 C)  TempSrc: Oral Oral Oral Oral  SpO2: 97% 98% 99% 100%  Weight: 57.1 kg     Height: 5\' 2"  (1.575 m)       Intake/Output Summary (Last 24 hours) at 08/21/2018 1332 Last data filed at 08/21/2018 1045 Gross per 24 hour  Intake 720 ml  Output 1500 ml  Net -780 ml   Filed Weights   08/19/18 1848 08/20/18 0437  Weight: 54.4 kg 57.1 kg    Exam:  . General: 63 y.o. year-old female well developed well nourished in no acute distress.  Alert and oriented x3. . Cardiovascular: Regular rate and rhythm with no rubs or gallops.  No thyromegaly or JVD noted.   Marland Kitchen Respiratory: Clear to auscultation with no wheezes or rales. Good inspiratory effort. . Abdomen: Soft nontender nondistended with normal bowel sounds x4 quadrants. . Musculoskeletal: No lower extremity edema. 2/4 pulses in all 4 extremities. Marland Kitchen Psychiatry: Mood is appropriate for condition and setting   Data Reviewed: CBC: Recent Labs  Lab 08/19/18 2032 08/20/18 0625  WBC 14.8* 10.4  NEUTROABS 12.5*  --   HGB 15.4* 13.6  HCT 46.8* 42.8  MCV 87.5 90.5  PLT 207 017   Basic Metabolic Panel: Recent Labs  Lab 08/19/18 2031 08/19/18 2032 08/20/18 0625  NA  --  134*  --   K  --  4.2  --   CL  --  100  --   CO2  --  23  --   GLUCOSE  --  146*  --  BUN  --  41*  --   CREATININE  --  0.46 0.51  CALCIUM  --  8.6*  --   MG 2.2  --   --   PHOS 2.9  --   --    GFR: Estimated Creatinine Clearance: 57.7 mL/min (by C-G formula based on SCr of 0.51 mg/dL). Liver Function Tests: No results for input(s): AST, ALT, ALKPHOS, BILITOT, PROT, ALBUMIN in the last 168 hours. No results for input(s): LIPASE, AMYLASE in the last 168 hours. No results for input(s): AMMONIA in the last 168 hours. Coagulation Profile: No results for input(s): INR, PROTIME in the last 168  hours. Cardiac Enzymes: No results for input(s): CKTOTAL, CKMB, CKMBINDEX, TROPONINI in the last 168 hours. BNP (last 3 results) No results for input(s): PROBNP in the last 8760 hours. HbA1C: No results for input(s): HGBA1C in the last 72 hours. CBG: No results for input(s): GLUCAP in the last 168 hours. Lipid Profile: No results for input(s): CHOL, HDL, LDLCALC, TRIG, CHOLHDL, LDLDIRECT in the last 72 hours. Thyroid Function Tests: No results for input(s): TSH, T4TOTAL, FREET4, T3FREE, THYROIDAB in the last 72 hours. Anemia Panel: No results for input(s): VITAMINB12, FOLATE, FERRITIN, TIBC, IRON, RETICCTPCT in the last 72 hours. Urine analysis:    Component Value Date/Time   COLORURINE YELLOW 08/21/2018 1045   APPEARANCEUR CLOUDY (A) 08/21/2018 1045   LABSPEC 1.025 08/21/2018 1045   PHURINE 6.0 08/21/2018 1045   GLUCOSEU >=500 (A) 08/21/2018 1045   HGBUR NEGATIVE 08/21/2018 [place] 08/21/2018 1045   KETONESUR 5 (A) 08/21/2018 1045   PROTEINUR NEGATIVE 08/21/2018 1045   NITRITE NEGATIVE 08/21/2018 1045   LEUKOCYTESUR NEGATIVE 08/21/2018 1045   Sepsis Labs: @LABRCNTIP (procalcitonin:4,lacticidven:4)  )No results found for this or any previous visit (from the past 240 hour(s)).    Studies: No results found.  Scheduled Meds: . dexamethasone  8 mg Oral Q8H  . DULoxetine  20 mg Oral Daily  . enoxaparin (LOVENOX) injection  40 mg Subcutaneous Q24H  . famotidine  20 mg Oral BID  . feeding supplement (ENSURE ENLIVE)  237 mL Oral BID BM  . gabapentin  100 mg Oral QHS  . morphine  15 mg Oral Q12H  . polyethylene glycol  17 g Oral BID    Continuous Infusions:   LOS: 1 day     Kayleen Memos, MD [facility] Pager [phone]  If 7PM-7AM, please contact night-coverage [email or web address] Password [email or web address] 08/21/2018, 1:32 PM

## 2018-08-21 NOTE — Consult Note (Signed)
Consultation Note Date: 08/21/2018   Patient Name: Kristina Mcfarland  DOB: [birth year]  MRN: [id number]  Age / Sex: 63 y.o., female  PCP: Willeen Niece, [place] Referring Physician: Kayleen Memos, DO  Reason for Consultation: Disposition and Establishing goals of care  HPI/Patient Profile: 63 y.o. female  admitted on 08/19/2018     Clinical Assessment and Goals of Care:  63 yo lady, known to PMT, diagnosed with metastatic breast cancer on last admission.  She was readmitted to hospital 1 day after discharge, due to intractable back pain. she remains on radiation. Medical oncology is also following. A PMT consult has been requested as a follow up from her recent hospitalization.   The patient is resting in bed. I introduced myself and palliative care as follows: Palliative medicine is specialized medical care for people living with serious illness. It focuses on providing relief from the symptoms and stress of a serious illness. The goal is to improve quality of life for both the patient and the family.  She states that at times, she is having severe spasms in her back and L thigh area. I reviewed her pain regimen with her and her bedside RN.   Offered supportive care, patient is very tearful at the moment. See below.   NEXT OF KIN  son Kristina Mcfarland [phone].   SUMMARY OF RECOMMENDATIONS   Continue current pain and non pain symptom management regimen. Will add lidoderm patch.   Discussed with bedside RN about the patient receiving a PRN dose prior to her radiation appointment. Remains full code for now Appreciate med onc follow up and recommendations  Recommend SNF rehab with palliative care on discharge.  Thank you for the consult.  Code Status/Advance Care Planning:  Full code    Symptom Management:    as above   Palliative Prophylaxis:   Delirium Protocol  Additional Recommendations  (Limitations, Scope, Preferences):  Full Scope Treatment  Psycho-social/Spiritual:   Desire for further Chaplaincy support:yes  Additional Recommendations: Caregiving  Support/Resources  Prognosis:   Unable to determine  Discharge Planning: [place] for rehab with Palliative care service follow-up      Primary Diagnoses: Present on Admission: . Metastatic breast cancer ([place])   I have reviewed the medical record, interviewed the patient and family, and examined the patient. The following aspects are pertinent.  Past Medical History:  Diagnosis Date  . Breast cancer ([place]) 2019  . SVT (supraventricular tachycardia) ([facility])    Social History   Socioeconomic History  . Marital status: Married    Spouse name: Not on file  . Number of children: Not on file  . Years of education: Not on file  . Highest education level: Not on file  Occupational History  . Not on file  Social Needs  . Financial resource strain: Not on file  . Food insecurity:    Worry: Not on file    Inability: Not on file  . Transportation needs:    Medical: Not on file  Non-medical: Not on file  Tobacco Use  . Smoking status: Current Every Day Smoker    Types: Cigarettes  . Smokeless tobacco: Never Used  Substance and Sexual Activity  . Alcohol use: No  . Drug use: No  . Sexual activity: Not on file  Lifestyle  . Physical activity:    Days per week: Not on file    Minutes per session: Not on file  . Stress: Not on file  Relationships  . Social connections:    Talks on phone: Not on file    Gets together: Not on file    Attends religious service: Not on file    Active member of club or organization: Not on file    Attends meetings of clubs or organizations: Not on file    Relationship status: Not on file  Other Topics Concern  . Not on file  Social History Narrative  . Not on file   History reviewed. No pertinent family history. Scheduled Meds: . dexamethasone  8 mg  Oral Q8H  . DULoxetine  20 mg Oral Daily  . enoxaparin (LOVENOX) injection  40 mg Subcutaneous Q24H  . famotidine  20 mg Oral BID  . feeding supplement (ENSURE ENLIVE)  237 mL Oral BID BM  . gabapentin  100 mg Oral QHS  . morphine  15 mg Oral Q12H  . polyethylene glycol  17 g Oral BID   Continuous Infusions: PRN Meds:.acetaminophen **OR** acetaminophen, bisacodyl, cyclobenzaprine, HYDROmorphone (DILAUDID) injection, ketorolac, ondansetron **OR** ondansetron (ZOFRAN) IV, oxyCODONE, sodium phosphate Medications Prior to Admission:  Prior to Admission medications   Medication Sig Start Date End Date Taking? Authorizing Provider  Aspirin-Salicylamide-Caffeine (ARTHRITIS STRENGTH BC POWDER PO) Take 1 Package by mouth daily as needed (arthritis pain).   Yes [provider]  dexamethasone (DECADRON) 4 MG tablet Take 2 tablets (8 mg total) by mouth every 8 (eight) hours. 08/18/18  Yes Charlynne Cousins, MD  famotidine (PEPCID) 20 MG tablet Take 1 tablet (20 mg total) by mouth 2 (two) times daily. 08/18/18  Yes Charlynne Cousins, MD  gabapentin (NEURONTIN) 100 MG capsule Take 1 capsule (100 mg total) by mouth at bedtime. 08/18/18  Yes Charlynne Cousins, MD  lidocaine (LIDODERM) 5 % Place 1 patch onto the skin daily. Remove & Discard patch within 12 hours or as directed by MD 08/18/18  Yes Charlynne Cousins, MD  MELATONIN PO Take 1 tablet by mouth at bedtime as needed (sleep).   Yes [provider]  morphine (MS CONTIN) 15 MG 12 hr tablet Take 1 tablet (15 mg total) by mouth every 12 (twelve) hours. 08/18/18  Yes Charlynne Cousins, MD  oxyCODONE (ROXICODONE) 15 MG immediate release tablet Take 1 tablet (15 mg total) by mouth every 4 (four) hours as needed for moderate pain or severe pain. 08/18/18  Yes Charlynne Cousins, MD  polyethylene glycol [facility] Floria Raveling) packet Take 17 g by mouth 2 (two) times daily. 08/18/18  Yes Charlynne Cousins, MD   Allergies  Allergen  Reactions  . Oxycodone-Acetaminophen Nausea And Vomiting  . Other     Medication for SVT, Anderal?   Review of Systems +back pain + spasms  Physical Exam             Sclerae anicteric             Oropharynx clear             No peripheral adenopathy  Lungs clear -- no rales or rhonchi             Heart regular rate and rhythm             Abdomen benign             MSK no focal spinal tenderness, no peripheral edema             Neuro nonfocal Vital Signs: BP 129/80 (BP Location: Right Arm)   Pulse 93   Temp 98.7 F (37.1 C) (Oral)   Resp 16   Ht 5\' 2"  (1.575 m)   Wt 57.1 kg   SpO2 100%   BMI 23.02 kg/m  Pain Scale: 0-10   Pain Score: 10-Worst pain ever   SpO2: SpO2: 100 % O2 Device:SpO2: 100 % O2 Flow Rate: .   IO: Intake/output summary:   Intake/Output Summary (Last 24 hours) at 08/21/2018 1346 Last data filed at 08/21/2018 1045 Gross per 24 hour  Intake 720 ml  Output 1500 ml  Net -780 ml    LBM: Last BM Date: 08/20/18 Baseline Weight: Weight: 54.4 kg Most recent weight: Weight: 57.1 kg     Palliative Assessment/Data:   PPS 40%  Time In:  12 Time Out:  1300 Time Total:  60 min  Greater than 50%  of this time was spent counseling and coordinating care related to the above assessment and plan.  Signed by: Loistine Chance, MD  [phone] Please contact Palliative Medicine Team phone at [phone] for questions and concerns.  For individual provider: See Shea Evans

## 2018-08-21 NOTE — Progress Notes (Signed)
Initial Nutrition Assessment  INTERVENTION:   Continue Ensure Enlive po BID, each supplement provides 350 kcal and 20 grams of protein  NUTRITION DIAGNOSIS:   Increased nutrient needs related to cancer and cancer related treatments as evidenced by estimated needs.  GOAL:   Patient will meet greater than or equal to 90% of their needs  MONITOR:   PO intake, Supplement acceptance, Labs, Weight trends, I & O's  REASON FOR ASSESSMENT:   Malnutrition Screening Tool   ASSESSMENT:   63 year old woman PMH metastatic breast cancer with extensive involvement of the cervical, thoracic and lumbar spine, diagnosed late December 2019, currently receiving chemotherapy, presented with worsening back pain, leg spasms, inability to ambulate.    Patient was assessed by nutrition team during previous admission on 12/27 and 1/3. During that admission pt had a good appetite, consuming 75-100% of meals. Pt did not have issues with appetite prior to this admission.  Pt continues to eat well, consuming 85-100% of meals. Pt has been drinking Ensure supplements. Per Oncology note, pt to continue palliative radiation and may start chemotherapy.   Per weight records, pt has lost 32 lb since 11/20 (20% wt loss x 1.5 months, significant for time frame).   Medications reviewed. Labs reviewed: Low Na   NUTRITION - FOCUSED PHYSICAL EXAM:    Most Recent Value  Orbital Region  No depletion  Upper Arm Region  Mild depletion  Thoracic and Lumbar Region  Unable to assess  Buccal Region  No depletion  Temple Region  No depletion  Clavicle Bone Region  No depletion  Clavicle and Acromion Bone Region  No depletion  Scapular Bone Region  Unable to assess  Dorsal Hand  No depletion  Patellar Region  No depletion  Anterior Thigh Region  No depletion  Posterior Calf Region  No depletion  Edema (RD Assessment)  None       Diet Order:   Diet Order            Diet regular Room service appropriate? Yes;  Fluid consistency: Thin  Diet effective now              EDUCATION NEEDS:   No education needs have been identified at this time  Skin:  Skin Assessment: Reviewed RN Assessment  Last BM:  1/7  Height:   Ht Readings from Last 1 Encounters:  08/20/18 5\' 2"  (1.575 m)    Weight:   Wt Readings from Last 1 Encounters:  08/20/18 57.1 kg    Ideal Body Weight:  50 kg  BMI:  Body mass index is 23.02 kg/m.  Estimated Nutritional Needs:   Kcal:  1700-1900  Protein:  85-95g  Fluid:  1.9L/day   Clayton Bibles, MS, RD, LDN [place] Diet[facility] Pager: [phone] After Hours Pager: [phone]

## 2018-08-22 ENCOUNTER — Ambulatory Visit: Payer: Self-pay

## 2018-08-22 LAB — BASIC METABOLIC PANEL
Anion gap: 9 (ref 5–15)
BUN: 48 mg/dL — ABNORMAL HIGH (ref 8–23)
CHLORIDE: 101 mmol/L (ref 98–111)
CO2: 24 mmol/L (ref 22–32)
CREATININE: 0.42 mg/dL — AB (ref 0.44–1.00)
Calcium: 8.2 mg/dL — ABNORMAL LOW (ref 8.9–10.3)
GFR calc Af Amer: >60 mL/min (ref >60)
GFR calc non Af Amer: >60 mL/min (ref >60)
Glucose, Bld: 189 mg/dL — ABNORMAL HIGH (ref 70–99)
Potassium: 4.5 mmol/L (ref 3.5–5.1)
Sodium: 134 mmol/L — ABNORMAL LOW (ref 135–145)

## 2018-08-22 LAB — CBC
HCT: 41.3 % (ref 36.0–46.0)
Hemoglobin: 13 g/dL (ref 12.0–15.0)
MCH: 28.4 pg (ref 26.0–34.0)
MCHC: 31.5 g/dL (ref 30.0–36.0)
MCV: 90.2 fL (ref 80.0–100.0)
Platelets: 127 10*3/uL — ABNORMAL LOW (ref 150–400)
RBC: 4.58 MIL/uL (ref 3.87–5.11)
RDW: 17 % — ABNORMAL HIGH (ref 11.5–15.5)
WBC: 9.3 10*3/uL (ref 4.0–10.5)
nRBC: 0 % (ref 0.0–0.2)

## 2018-08-22 MED ORDER — CYCLOBENZAPRINE HCL 5 MG PO TABS
7.5000 mg | ORAL_TABLET | Freq: Three times a day (TID) | ORAL | Status: DC | PRN
  Administered 2018-08-23 – 2018-08-27 (×4): 7.5 mg via ORAL
  Filled 2018-08-22 (×4): qty 2

## 2018-08-22 NOTE — Progress Notes (Signed)
Kristina Mcfarland   MHD:[birth year]   GX#:[id number]   CXK#:[id number]  Oncology follow-up note  Subjective: Patient states her back pain is still severe with changing position, but she is comfortable when she lays down in bed. She did sit up in chair earlier today, but was not able to walk.  She is tolerating radiation well, no other new complaints.  Objective:  Vitals:   08/22/18 0614 08/22/18 1452  BP: 123/84 132/90  Pulse: 86 (!) 103  Resp: 17 16  Temp: 98.3 F (36.8 C) 98.3 F (36.8 C)  SpO2: 100% 97%    Body mass index is 23.02 kg/m.  Intake/Output Summary (Last 24 hours) at 08/22/2018 2030 Last data filed at 08/22/2018 1452 Gross per 24 hour  Intake 120 ml  Output 200 ml  Net -80 ml     Sclerae unicteric  Oropharynx clear  No peripheral adenopathy  Lungs clear -- no rales or rhonchi  Heart regular rate and rhythm  Abdomen benign  MSK no focal spinal tenderness, no peripheral edema  Neuro nonfocal   CBG (last 3)  No results for input(s): GLUCAP in the last 72 hours.   Labs:  Lab Results  Component Value Date   WBC 9.3 08/22/2018   HGB 13.0 08/22/2018   HCT 41.3 08/22/2018   MCV 90.2 08/22/2018   PLT 127 (L) 08/22/2018   NEUTROABS 12.5 (H) 08/19/2018   CMP Latest Ref Rng & Units 08/22/2018 08/20/2018 08/19/2018  Glucose 70 - 99 mg/dL 189(H) - 146(H)  BUN 8 - 23 mg/dL 48(H) - 41(H)  Creatinine 0.44 - 1.00 mg/dL 0.42(L) 0.51 0.46  Sodium 135 - 145 mmol/L 134(L) - 134(L)  Potassium 3.5 - 5.1 mmol/L 4.5 - 4.2  Chloride 98 - 111 mmol/L 101 - 100  CO2 22 - 32 mmol/L 24 - 23  Calcium 8.9 - 10.3 mg/dL 8.2(L) - 8.6(L)  Total Protein 6.5 - 8.1 g/dL - - -  Total Bilirubin 0.3 - 1.2 mg/dL - - -  Alkaline Phos 38 - 126 U/L - - -  AST 15 - 41 U/L - - -  ALT 0 - 44 U/L - - -     Urine Studies No results for input(s): UHGB, CRYS in the last 72 hours.  Invalid input(s): UACOL, UAPR, USPG, UPH, UTP, UGL, UKET, UBIL, UNIT, UROB, ULEU, UEPI, UWBC, URBC, UBAC, CAST, [place],  [place]  Basic Metabolic Panel: Recent Labs  Lab 08/19/18 2031 08/19/18 2032 08/20/18 0625 08/22/18 0716  NA  --  134*  --  134*  K  --  4.2  --  4.5  CL  --  100  --  101  CO2  --  23  --  24  GLUCOSE  --  146*  --  189*  BUN  --  41*  --  48*  CREATININE  --  0.46 0.51 0.42*  CALCIUM  --  8.6*  --  8.2*  MG 2.2  --   --   --   PHOS 2.9  --   --   --    GFR Estimated Creatinine Clearance: 57.7 mL/min (A) (by C-G formula based on SCr of 0.42 mg/dL (L)). Liver Function Tests: No results for input(s): AST, ALT, ALKPHOS, BILITOT, PROT, ALBUMIN in the last 168 hours. No results for input(s): LIPASE, AMYLASE in the last 168 hours. No results for input(s): AMMONIA in the last 168 hours. Coagulation profile No results for input(s): INR, PROTIME in the last 168 hours.  CBC:  Recent Labs  Lab 08/19/18 2032 08/20/18 0625 08/22/18 0716  WBC 14.8* 10.4 9.3  NEUTROABS 12.5*  --   --   HGB 15.4* 13.6 13.0  HCT 46.8* 42.8 41.3  MCV 87.5 90.5 90.2  PLT 207 173 127*   Cardiac Enzymes: No results for input(s): CKTOTAL, CKMB, CKMBINDEX, TROPONINI in the last 168 hours. BNP: Invalid input(s): POCBNP CBG: No results for input(s): GLUCAP in the last 168 hours. D-Dimer No results for input(s): DDIMER in the last 72 hours. Hgb A1c No results for input(s): HGBA1C in the last 72 hours. Lipid Profile No results for input(s): CHOL, HDL, LDLCALC, TRIG, CHOLHDL, LDLDIRECT in the last 72 hours. Thyroid function studies No results for input(s): TSH, T4TOTAL, T3FREE, THYROIDAB in the last 72 hours.  Invalid input(s): FREET3 Anemia work up No results for input(s): VITAMINB12, FOLATE, FERRITIN, TIBC, IRON, RETICCTPCT in the last 72 hours. Microbiology No results found for this or any previous visit (from the past 240 hour(s)).    Studies:  No results found.  Assessment: 63 y.o. postmenopausal Caucasian female, with past medical history of SVT, palpable left breast mass 4 months ago,  presented with severe low back and bilateral hip pain  1. Diffuse metastatic breast cancer to bones, ER+/PR+/HER2 pending  2.Severe back pain and low extremity weakness, secondary to #1 3. History of SVT 4. Deconditioning    Plan:  -I called path lab today, her biopsy HER 2 is still pending, if negative, I plan to start her on anastrozole and Ibrance  -appreciate palliative care Dr. Inda Castle input for her pan management  -continue RT -I will f/u on Monday if she is still in house, and will set up her clinical f/u appointments    Truitt Merle, MD 08/22/2018  8:30 PM

## 2018-08-22 NOTE — Progress Notes (Signed)
PROGRESS NOTE  Kristina Mcfarland GOT:[id number] DOB: May 25, 1956 DOA: 08/19/2018 PCP: Willeen Niece, PA  HPI/Recap of past 72 hours: 63 year old woman PMH metastatic breast cancer with extensive involvement of the cervical, thoracic and lumbar spine, diagnosed late December 2019, currently receiving chemotherapy, presented with worsening back pain, leg spasms, inability to ambulate.    Discharged 1/5 after being evaluated for new diagnosis of metastatic breast cancer, cancer related pain.  SNF was recommended but could not be pursued apparently secondary to insurance.  08/21/2018: Patient seen and examined at bedside.  No acute events overnight.  Reports intermittent back pain which is well controlled on current pain medications.  Ongoing radiation therapy.  Patient will need SNF at discharge per PT recommendation.  CSW has been consulted.  08/22/2018: Patient seen and examined at bedside.  Reports muscle spasm with minimal movement.  Flexeril dose increased to 7.5 3 times daily as needed for muscle spasm.  Assessment/Plan: Principal Problem:   Metastatic breast cancer ([place]) Active Problems:   Intractable back pain   Weakness of both lower extremities  Intractable back and lower extremity pain secondary to mets to bones in the setting of breast cancer Patient is currently receiving chemotherapy and ongoing radiation therapy Dr. Burr Medico is following Pain management and bowel regimen in place Fall precautions Dependent reviewed MRI of spine which revealed mets affecting cervical, thoracic and lumbar regions Palliative care team consulted to assist with symptoms management and goals of care Flexeril 7.5 mg as needed for muscle spasm 3 times daily prn Lidocaine patch  Ambulatory dysfunction PT recommends SNF CSW consulted to assist with SNF placement with palliative care services  Fall precautions  Pathological fracture in the setting of breast cancer with mets to bone Fall  precautions Ongoing radiation therapy Oncology following Pain management     DVT prophylaxis:  Subcu Lovenox daily  Code Status: Full Family Communication:  None at bedside Disposition Plan: SNF when hemodynamically stable and oncology/radiation oncology sign off.    Objective: Vitals:   08/21/18 0558 08/21/18 1428 08/21/18 2306 08/22/18 0614  BP: 129/80 121/74 (!) 126/93 123/84  Pulse: 93 90 (!) 110 86  Resp: 16 17 18 17   Temp: 98.7 F (37.1 C) 98.1 F (36.7 C) 97.8 F (36.6 C) 98.3 F (36.8 C)  TempSrc: Oral Oral Oral Oral  SpO2: 100% 100% 98% 100%  Weight:      Height:        Intake/Output Summary (Last 24 hours) at 08/22/2018 1034 Last data filed at 08/21/2018 1600 Gross per 24 hour  Intake 240 ml  Output 600 ml  Net -360 ml   Filed Weights   08/19/18 1848 08/20/18 0437  Weight: 54.4 kg 57.1 kg    Exam:  . General: 63 y.o. year-old female developed well-nourished in no acute distress.  Alert and oriented x3.   . Cardiovascular: Good rate and rhythm with no rubs or gallops.  No JVD or thyromegaly noted.   [name] Respiratory: Clear to auscultation with no wheezes or rales.  Good inspiratory effort. . Abdomen: Soft nontender nondistended with normal bowel sounds x4 quadrants. . Musculoskeletal: No lower extremity edema. 2/4 pulses in all 4 extremities. [name] Psychiatry: Mood is appropriate for condition and setting   Data Reviewed: CBC: Recent Labs  Lab 08/19/18 2032 08/20/18 0625 08/22/18 0716  WBC 14.8* 10.4 9.3  NEUTROABS 12.5*  --   --   HGB 15.4* 13.6 13.0  HCT 46.8* 42.8 41.3  MCV 87.5 90.5 90.2  PLT 207  173 373*   Basic Metabolic Panel: Recent Labs  Lab 08/19/18 2031 08/19/18 2032 08/20/18 0625 08/22/18 0716  NA  --  134*  --  134*  K  --  4.2  --  4.5  CL  --  100  --  101  CO2  --  23  --  24  GLUCOSE  --  146*  --  189*  BUN  --  41*  --  48*  CREATININE  --  0.46 0.51 0.42*  CALCIUM  --  8.6*  --  8.2*  MG 2.2  --   --   --   PHOS  2.9  --   --   --    GFR: Estimated Creatinine Clearance: 57.7 mL/min (A) (by C-G formula based on SCr of 0.42 mg/dL (L)). Liver Function Tests: No results for input(s): AST, ALT, ALKPHOS, BILITOT, PROT, ALBUMIN in the last 168 hours. No results for input(s): LIPASE, AMYLASE in the last 168 hours. No results for input(s): AMMONIA in the last 168 hours. Coagulation Profile: No results for input(s): INR, PROTIME in the last 168 hours. Cardiac Enzymes: No results for input(s): CKTOTAL, CKMB, CKMBINDEX, TROPONINI in the last 168 hours. BNP (last 3 results) No results for input(s): PROBNP in the last 8760 hours. HbA1C: No results for input(s): HGBA1C in the last 72 hours. CBG: No results for input(s): GLUCAP in the last 168 hours. Lipid Profile: No results for input(s): CHOL, HDL, LDLCALC, TRIG, CHOLHDL, LDLDIRECT in the last 72 hours. Thyroid Function Tests: No results for input(s): TSH, T4TOTAL, FREET4, T3FREE, THYROIDAB in the last 72 hours. Anemia Panel: No results for input(s): VITAMINB12, FOLATE, FERRITIN, TIBC, IRON, RETICCTPCT in the last 72 hours. Urine analysis:    Component Value Date/Time   COLORURINE YELLOW 08/21/2018 1045   APPEARANCEUR CLOUDY (A) 08/21/2018 1045   LABSPEC 1.025 08/21/2018 1045   PHURINE 6.0 08/21/2018 1045   GLUCOSEU >=500 (A) 08/21/2018 1045   HGBUR NEGATIVE 08/21/2018 [place] 08/21/2018 1045   KETONESUR 5 (A) 08/21/2018 1045   PROTEINUR NEGATIVE 08/21/2018 1045   NITRITE NEGATIVE 08/21/2018 1045   LEUKOCYTESUR NEGATIVE 08/21/2018 1045   Sepsis Labs: @LABRCNTIP (procalcitonin:4,lacticidven:4)  )No results found for this or any previous visit (from the past 240 hour(s)).    Studies: No results found.  Scheduled Meds: . dexamethasone  8 mg Oral Q8H  . DULoxetine  20 mg Oral Daily  . enoxaparin (LOVENOX) injection  40 mg Subcutaneous Q24H  . famotidine  20 mg Oral BID  . feeding supplement (ENSURE ENLIVE)  237 mL Oral  BID BM  . gabapentin  100 mg Oral QHS  . lidocaine  1 patch Transdermal Q24H  . polyethylene glycol  17 g Oral BID  . senna-docusate  2 tablet Oral BID    Continuous Infusions:   LOS: 2 days     Kayleen Memos, MD [facility] Pager [phone]  If 7PM-7AM, please contact night-coverage [email or web address] Password [facility] 08/22/2018, 10:34 AM

## 2018-08-23 ENCOUNTER — Ambulatory Visit
Admit: 2018-08-23 | Discharge: 2018-08-23 | Disposition: A | Discharge status: Home / Self Care | Payer: Self-pay | Attending: Radiation Oncology | Admitting: Radiation Oncology

## 2018-08-23 ENCOUNTER — Inpatient Hospital Stay (HOSPITAL_COMMUNITY): Payer: Self-pay

## 2018-08-23 LAB — BASIC METABOLIC PANEL
Anion gap: 7 (ref 5–15)
BUN: 34 mg/dL — ABNORMAL HIGH (ref 8–23)
CALCIUM: 8.2 mg/dL — AB (ref 8.9–10.3)
CO2: 25 mmol/L (ref 22–32)
Chloride: 103 mmol/L (ref 98–111)
Creatinine, Ser: 0.38 mg/dL — ABNORMAL LOW (ref 0.44–1.00)
GFR calc Af Amer: >60 mL/min (ref >60)
GFR calc non Af Amer: >60 mL/min (ref >60)
Glucose, Bld: 231 mg/dL — ABNORMAL HIGH (ref 70–99)
Potassium: 4 mmol/L (ref 3.5–5.1)
Sodium: 135 mmol/L (ref 135–145)

## 2018-08-23 LAB — CBC
HCT: 39.1 % (ref 36.0–46.0)
Hemoglobin: 12.7 g/dL (ref 12.0–15.0)
MCH: 28.2 pg (ref 26.0–34.0)
MCHC: 32.5 g/dL (ref 30.0–36.0)
MCV: 86.9 fL (ref 80.0–100.0)
NRBC: 0.4 % — AB (ref 0.0–0.2)
PLATELETS: 112 10*3/uL — AB (ref 150–400)
RBC: 4.5 MIL/uL (ref 3.87–5.11)
RDW: 16.5 % — ABNORMAL HIGH (ref 11.5–15.5)
WBC: 5.3 10*3/uL (ref 4.0–10.5)

## 2018-08-23 LAB — HEMOGLOBIN A1C
Hgb A1c MFr Bld: 6.5 % — ABNORMAL HIGH (ref 4.8–5.6)
Mean Plasma Glucose: 139.85 mg/dL

## 2018-08-23 MED ORDER — GADOBUTROL 1 MMOL/ML IV SOLN
6.0000 mL | Freq: Once | INTRAVENOUS | Status: AC | PRN
  Administered 2018-08-23: 6 mL via INTRAVENOUS

## 2018-08-23 MED ORDER — MORPHINE SULFATE ER 15 MG PO TBCR
15.0000 mg | EXTENDED_RELEASE_TABLET | Freq: Two times a day (BID) | ORAL | Status: DC
  Administered 2018-08-23 – 2018-08-28 (×11): 15 mg via ORAL
  Filled 2018-08-23 (×11): qty 1

## 2018-08-23 MED ORDER — DULOXETINE HCL 30 MG PO CPEP
30.0000 mg | ORAL_CAPSULE | Freq: Every day | ORAL | Status: DC
  Administered 2018-08-24 – 2018-09-03 (×11): 30 mg via ORAL
  Filled 2018-08-23 (×11): qty 1

## 2018-08-23 MED ORDER — LORAZEPAM 2 MG/ML IJ SOLN
1.0000 mg | INTRAMUSCULAR | Status: AC
  Administered 2018-08-23: 1 mg via INTRAVENOUS
  Filled 2018-08-23: qty 1

## 2018-08-23 MED ORDER — ALPRAZOLAM 0.5 MG PO TABS
0.5000 mg | ORAL_TABLET | Freq: Once | ORAL | Status: AC
  Administered 2018-08-23: 0.5 mg via ORAL
  Filled 2018-08-23: qty 1

## 2018-08-23 NOTE — Progress Notes (Signed)
Pt extremely anxious. Pt reports sobbing on and off for the last hour and not being able to get comfortable. Pt refused repositioning. MD notified.

## 2018-08-23 NOTE — Progress Notes (Addendum)
PROGRESS NOTE  Kristina Mcfarland GUY:[id number] DOB: Nov 18, 1955 DOA: 08/19/2018 PCP: Kristina Niece, PA  HPI/Recap of past 71 hours: 63 year old woman PMH metastatic breast cancer with extensive involvement of the cervical, thoracic and lumbar spine, diagnosed late December 2019, currently receiving chemotherapy, presented with worsening back pain, leg spasms, inability to ambulate.    Discharged 1/5 after being evaluated for new diagnosis of metastatic breast cancer, cancer related pain.  SNF was recommended but could not be pursued apparently secondary to insurance.  08/21/2018: Patient seen and examined at bedside.  No acute events overnight.  Reports intermittent back pain which is well controlled on current pain medications.  Ongoing radiation therapy.  Patient will need SNF at discharge per PT recommendation.  CSW has been consulted.  08/22/2018: Patient seen and examined at bedside.  Reports muscle spasm with minimal movement.  Flexeril dose increased to 7.5 3 times daily as needed for muscle spasm.  08/23/2018: Patient seen and examined at bedside.  Reports still has a lot of pain in her lower extremities bilaterally and some numbness in her left upper extremity affecting left fourth and fifth digits.  Obtain repeat MRI of cervical, thoracic and lumbar spine.  Assessment/Plan: Principal Problem:   Metastatic breast cancer ([place]) Active Problems:   Intractable back pain   Weakness of both lower extremities  Intractable back and lower extremity pain secondary to mets to bones in the setting of breast cancer Patient is currently receiving chemotherapy and ongoing radiation therapy Dr. Burr Mcfarland is following Pain management and bowel regimen in place Fall precautions Dependent reviewed MRI of spine which revealed mets affecting cervical, thoracic and lumbar regions Palliative care team consulted to assist with symptoms management and goals of care Flexeril 7.5 mg as needed for muscle spasm 3 times  daily prn Lidocaine patch Palliative care consulted for symptom management.  Highly appreciated. Repeat MRI cervical, thoracic and lumbar spine  Ambulatory dysfunction PT recommends SNF CSW consulted to assist with SNF placement with palliative care services  Fall precautions  Pathological fracture in the setting of breast cancer with mets to bone Fall precautions Ongoing radiation therapy Oncology following Pain management     DVT prophylaxis:  Subcu Lovenox daily  Code Status: Full Family Communication:  None at bedside Disposition Plan: SNF when hemodynamically stable and oncology/radiation oncology sign off.    Objective: Vitals:   08/22/18 1452 08/22/18 2127 08/23/18 0623 08/23/18 1341  BP: 132/90 125/86 121/87 108/80  Pulse: (!) 103 91 84 99  Resp: 16 18 17 16   Temp: 98.3 F (36.8 C) 98.2 F (36.8 C) 98 F (36.7 C) 98.1 F (36.7 C)  TempSrc: Oral Oral Oral Oral  SpO2: 97% 95% 96% 100%  Weight:      Height:        Intake/Output Summary (Last 24 hours) at 08/23/2018 1718 Last data filed at 08/23/2018 0957 Gross per 24 hour  Intake 360 ml  Output -  Net 360 ml   Filed Weights   08/19/18 1848 08/20/18 0437  Weight: 54.4 kg 57.1 kg    Exam:  . General: 63 y.o. year-old female developed well-nourished appears uncomfortable due to lower back pain.   . Cardiovascular: Regular rate and rhythm with no rubs or gallops.  No JVD or thyromegaly noted. Marland Kitchen Respiratory: Clear to auscultation with no wheezes or rales.  Poor inspiratory effort. . Abdomen: Soft nontender nondistended with normal bowel sounds x4 quadrants. . Musculoskeletal: Trace lower extremity edema. 2/4 pulses in all 4 extremities. Marland Kitchen  Psychiatry: Mood is appropriate for condition and setting   Data Reviewed: CBC: Recent Labs  Lab 08/19/18 2032 08/20/18 0625 08/22/18 0716 08/23/18 0333  WBC 14.8* 10.4 9.3 5.3  NEUTROABS 12.5*  --   --   --   HGB 15.4* 13.6 13.0 12.7  HCT 46.8* 42.8 41.3  39.1  MCV 87.5 90.5 90.2 86.9  PLT 207 173 127* 073*   Basic Metabolic Panel: Recent Labs  Lab 08/19/18 2031 08/19/18 2032 08/20/18 0625 08/22/18 0716 08/23/18 0333  NA  --  134*  --  134* 135  K  --  4.2  --  4.5 4.0  CL  --  100  --  101 103  CO2  --  23  --  24 25  GLUCOSE  --  146*  --  189* 231*  BUN  --  41*  --  48* 34*  CREATININE  --  0.46 0.51 0.42* 0.38*  CALCIUM  --  8.6*  --  8.2* 8.2*  MG 2.2  --   --   --   --   PHOS 2.9  --   --   --   --    GFR: Estimated Creatinine Clearance: 57.7 mL/min (A) (by C-G formula based on SCr of 0.38 mg/dL (L)). Liver Function Tests: No results for input(s): AST, ALT, ALKPHOS, BILITOT, PROT, ALBUMIN in the last 168 hours. No results for input(s): LIPASE, AMYLASE in the last 168 hours. No results for input(s): AMMONIA in the last 168 hours. Coagulation Profile: No results for input(s): INR, PROTIME in the last 168 hours. Cardiac Enzymes: No results for input(s): CKTOTAL, CKMB, CKMBINDEX, TROPONINI in the last 168 hours. BNP (last 3 results) No results for input(s): PROBNP in the last 8760 hours. HbA1C: Recent Labs    08/23/18 0333  HGBA1C 6.5*   CBG: No results for input(s): GLUCAP in the last 168 hours. Lipid Profile: No results for input(s): CHOL, HDL, LDLCALC, TRIG, CHOLHDL, LDLDIRECT in the last 72 hours. Thyroid Function Tests: No results for input(s): TSH, T4TOTAL, FREET4, T3FREE, THYROIDAB in the last 72 hours. Anemia Panel: No results for input(s): VITAMINB12, FOLATE, FERRITIN, TIBC, IRON, RETICCTPCT in the last 72 hours. Urine analysis:    Component Value Date/Time   COLORURINE YELLOW 08/21/2018 1045   APPEARANCEUR CLOUDY (A) 08/21/2018 1045   LABSPEC 1.025 08/21/2018 1045   PHURINE 6.0 08/21/2018 1045   GLUCOSEU >=500 (A) 08/21/2018 1045   HGBUR NEGATIVE 08/21/2018 [place] 08/21/2018 1045   KETONESUR 5 (A) 08/21/2018 1045   PROTEINUR NEGATIVE 08/21/2018 1045   NITRITE NEGATIVE  08/21/2018 1045   LEUKOCYTESUR NEGATIVE 08/21/2018 1045   Sepsis Labs: @LABRCNTIP (procalcitonin:4,lacticidven:4)  )No results found for this or any previous visit (from the past 240 hour(s)).    Studies: No results found.  Scheduled Meds: . dexamethasone  8 mg Oral Q8H  . [START ON 08/24/2018] DULoxetine  30 mg Oral Daily  . enoxaparin (LOVENOX) injection  40 mg Subcutaneous Q24H  . famotidine  20 mg Oral BID  . feeding supplement (ENSURE ENLIVE)  237 mL Oral BID BM  . gabapentin  100 mg Oral QHS  . lidocaine  1 patch Transdermal Q24H  . LORazepam  1 mg Intravenous Once  . morphine  15 mg Oral Q12H  . polyethylene glycol  17 g Oral BID  . senna-docusate  2 tablet Oral BID    Continuous Infusions:   LOS: 3 days     Kayleen Memos,  MD [facility] Pager [phone]  If 7PM-7AM, please contact night-coverage [email or web address] Password [email or web address] 08/23/2018, 5:18 PM

## 2018-08-23 NOTE — Progress Notes (Signed)
Daily Progress Note   Patient Name: Kristina Mcfarland       Date: 08/23/2018 DOB: [birth year]  Age: 63 y.o. MRN#: [id number] Attending Physician: Kayleen Memos, DO Primary Care Physician: Willeen Niece, [place] Admit Date: 08/19/2018  Reason for Consultation/Follow-up: Establishing goals of care, Non pain symptom management and Pain control  Subjective:  patient is awake alert resting in bed. She still complains of pain in her L leg, ongoing episodic discomfort.  Patient remains tearful and anxious at times.  There is no family at bedside  See below  Length of Stay: 3  Current Medications: Scheduled Meds:  . dexamethasone  8 mg Oral Q8H  . [START ON 08/24/2018] DULoxetine  30 mg Oral Daily  . enoxaparin (LOVENOX) injection  40 mg Subcutaneous Q24H  . famotidine  20 mg Oral BID  . feeding supplement (ENSURE ENLIVE)  237 mL Oral BID BM  . gabapentin  100 mg Oral QHS  . lidocaine  1 patch Transdermal Q24H  . morphine  15 mg Oral Q12H  . polyethylene glycol  17 g Oral BID  . senna-docusate  2 tablet Oral BID    Continuous Infusions:   PRN Meds: acetaminophen **OR** acetaminophen, bisacodyl, cyclobenzaprine, HYDROmorphone (DILAUDID) injection, ketorolac, ondansetron **OR** ondansetron (ZOFRAN) IV, oxyCODONE, sodium phosphate  Physical Exam         Awake alert Emotionally labile Regular S 1 S 2  Abdomen is soft No edema  Vital Signs: BP 108/80 (BP Location: Right Arm)   Pulse 99   Temp 98.1 F (36.7 C) (Oral)   Resp 16   Ht '5\' 2"'  (1.575 m)   Wt 57.1 kg   SpO2 100%   BMI 23.02 kg/m  SpO2: SpO2: 100 % O2 Device: O2 Device: Room Air O2 Flow Rate:    Intake/output summary:   Intake/Output Summary (Last 24 hours) at 08/23/2018 1410 Last data filed at 08/23/2018 0957 Gross per 24  hour  Intake 480 ml  Output 200 ml  Net 280 ml   LBM: Last BM Date: 08/22/18 Baseline Weight: Weight: 54.4 kg Most recent weight: Weight: 57.1 kg PPS 40%      Palliative Assessment/Data:    Flowsheet Rows     Most Recent Value  Intake Tab  Referral Department  Hospitalist  Unit at Time of Referral  Oncology Unit  Palliative Care Primary Diagnosis  Cancer  Date Notified  08/20/18  Palliative Care Type  Return patient Palliative Care  Reason for referral  Non-pain Symptom, Clarify Goals of Care  Date of Admission  08/20/18  Date first seen by Palliative Care  08/21/18  # of days Palliative referral response time  1 Day(s)  # of days IP prior to Palliative referral  0  Clinical Assessment  Psychosocial & Spiritual Assessment  Palliative Care Outcomes      Patient Active Problem List   Diagnosis Date Noted  . Intractable back pain 08/20/2018  . Weakness of both lower extremities 08/20/2018  . Metastatic breast cancer ([place]) 08/19/2018  . Palliative care by specialist   . Goals of care, counseling/discussion   . Cancer associated pain   . Constipation   . Breast cancer metastasized to bone ([place]) 08/08/2018  . Paroxysmal SVT (supraventricular tachycardia) ([place]) 08/08/2018  . LFT elevation 08/08/2018  . Back pain 08/08/2018    Palliative Care Assessment & Plan   Patient Profile:    Assessment:  63 y.o. postmenopausal Caucasian female, with past medical history of SVT, palpable left breast mass 4 months ago, presented with severe low back and bilateral hip pain  1.Diffuse metastatic breast cancer to bones, ER+/PR+/HER2 pending  2.Severe back pain and low extremity weakness, secondary to #1 3. History of SVT 4. Deconditioning   Acute anxiety and patient is emotionally labile, with regards to her cancer diagnosis, ?adjustment disorder.   Recommendations/Plan:   Discussed with patient today, regarding her pain and non pain regimen. Also broadly discussed  overall goals of care.   Patient awaits further biopsy HER 2 testing, she is appreciative of med onc support through Dr Burr Medico. Patient states she is looking forward to completing radiation and then starting chemotherapy.   Additionally, we also discussed about her symptom management medications. The patient is on decadron, Cymbalta, neurontin. She is also on opioids, IV Dilaudid PRN (she's required 3 doses of 0.5 mg IV each in the past 24 hours) as well as PO Oxy IR PRN (she's required 4 doses od 15 mg each PO Oxy IR in the past 24 hours). She was on scheduled long acting morphine MS Contin at home. Will resume this. Will also slightly increase dose of Cymbalta and monitor. She is trying to participate with PT.   Continue current mode of care for now.   Goals of Care and Additional Recommendations:  Limitations on Scope of Treatment: Full Scope Treatment  Code Status:    Code Status Orders  (From admission, onward)         Start     Ordered   08/20/18 0148  Full code  Continuous     08/20/18 0147        Code Status History    Date Active Date Inactive Code Status Order ID Comments User Context   08/08/2018 1307 08/18/2018 1728 Full Code [id number]  Reubin Milan, MD Inpatient       Prognosis:   Unable to determine  Discharge Planning:  [place] for rehab with Palliative care service follow-up  Care plan was discussed with  Patient.   Thank you for allowing the Palliative Medicine Team to assist in the care of this patient.   Time In: 1300 Time Out: 1335 Total Time 35 Prolonged Time Billed  no       Greater than 50%  of this time was spent counseling and coordinating care related  to the above assessment and plan.  Loistine Chance, MD [phone] Please contact Palliative Medicine Team phone at [phone] for questions and concerns.

## 2018-08-23 NOTE — Progress Notes (Signed)
Pt c/o of pain that she said, "feels like every time you try and roll or reposition my bones feel like they're breaking". Pt refused being turned to clean her up. MD notified.

## 2018-08-23 NOTE — Progress Notes (Signed)
  Radiation Oncology         [phone] ________________________________  Name: Kristina Mcfarland MRN: [id number]  Date: 08/09/2018  DOB: [birth year]  SIMULATION AND TREATMENT PLANNING NOTE  DIAGNOSIS:     ICD-10-CM   1. Bone metastasis ([place]) C79.51      Site:   1.  Tspine: centered at T8 2.  L spine centered at L3  NARRATIVE:  The patient was brought to the [facility].  Identity was confirmed.  All relevant records and images related to the planned course of therapy were reviewed.   Written consent to proceed with treatment was confirmed which was freely given after reviewing the details related to the planned course of therapy had been reviewed with the patient.  Then, the patient was set-up in a stable reproducible  supine position for radiation therapy.  CT images were obtained.  Surface markings were placed.    Medically necessary complex treatment device(s) for immobilization: Customized Vac-Lok bag.   The CT images were loaded into the planning software.  Then the target and avoidance structures were contoured.  Treatment planning then occurred.  The radiation prescription was entered and confirmed.  A total of 5 complex treatment devices were fabricated which relate to the designed radiation treatment fields. Each of these customized fields/ complex treatment devices will be used on a daily basis during the radiation course. I have requested : Isodose Plan.   PLAN:  The patient will receive 30 Gy in 10 fractions.  ________________________________   Jodelle Gross, MD, PhD

## 2018-08-23 NOTE — Progress Notes (Signed)
CSW consulted to assist with disposition- Pt known to CSW from recent hospitalization- see assessment below from that encounter 08/13/18. Pt readmitted for intractable pain.  Spoke with pt briefly as she was preparing to go to radiation. Pt states that "she filed Medicaid-" CSW will attempt to find out status of application- pt stated she has worker and would locate contact information once she is back in room.  Pt is once again open to SNF however would need payor source (see below). She states, "At the very least I'll need a hospital bed at home if nothing else." Will follow.  Sharren Bridge, MSW, LCSW Clinical Social Work 08/23/2018 [phone]   Clinical Social Work Assessment  Patient Details  Name: Kristina Mcfarland MRN: [id number] Date of Birth: October 17, 1955  Date of referral:  08/13/18               Reason for consult:  Discharge Planning, Facility Placement                    Permission sought to share information with:    Permission granted to share information::                Name::                   Agency::                Relationship::                Contact Information:     Housing/Transportation Living arrangements for the past 2 months:  Single Family Home Source of Information:  Patient, Medical Team Patient Interpreter Needed:  None Criminal Activity/Legal Involvement Pertinent to Current Situation/Hospitalization:  No - Comment as needed Significant Relationships:  Adult Children, Spouse, Other Family Members Lives with:  Self, Adult Children Do you feel safe going back to the place where you live?  Yes Need for family participation in patient care:  Yes (Comment)(pt reports family very involved)  Care giving concerns:  Pt admitted from home where she resides with her husband, father in law, and son. Has strong family support system- also has daughter, grandchildren, and mother in area. 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 "Started using my FIL's walker for a while, and started sleeping in the living room instead of the bedroom so I could be closer to the bathroom and help if I needed it. 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 Reports resulting severe pain in her back.    Social Worker assessment / plan: CSW consulted to assist with disposition as SNF is recommended. Met with pt at bedside, pt very engaged and alert/oriented.  Pt explains she resigned from her job in October when her pain became too much to handle and work, therefore lost her insurance coverage and has applied for [employer].  Pt states she is living on savings in the meantime and has no finances to afford SNF or personal caregivers. Expects to return home at [place] with help of various family members. States, "I want to get therapy to walk again, but I know in the meantime I will need a lot of help." CSW will follow for needs, unsure of  disposition plan at this time.  Employment status:  Unemployed Forensic scientist:    PT Recommendations:  [place] / Referral to community resources:     Patient/Family's Response to care:  Pt very appreciative  Patient/Family's Understanding of and Emotional Response to Diagnosis, Current Treatment, and Prognosis:  Pt expressed good understanding of her status and treatment, describing to CSW as charted. Pt was emotionally very well adjusted as evidenced by stating she was hopeful about her progress but also realistic due to having seen both her parents and several other family members being treated for cancer in the past. Also states she is familiar with therapy and rehab goals due to her husband's experience in the past and her own time in therapy  after a ruptured disc about 20 years ago.   Emotional Assessment Appearance:  Appears stated age Attitude/Demeanor/Rapport:  Engaged Affect (typically observed):  Pleasant, Hopeful Orientation:  Oriented to Self, Oriented to Place, Oriented to  Time, Oriented to Situation Alcohol / Substance use:  Not Applicable Psych involvement (Current and /or in the community):  No (Comment)  Discharge Needs  Concerns to be addressed:  Discharge Planning Concerns Readmission within the last 30 days:  No Current discharge risk:  Dependent with Mobility Barriers to Discharge:  Continued Medical Work up   [employer], LCSW 08/13/2018, 2:00 PM  [phone]         Electronically signed by Nila Nephew, LCSW at 08/13/2018 2:13 PM

## 2018-08-23 NOTE — Progress Notes (Signed)
Physical Therapy Treatment Patient Details Name: Kristina Mcfarland MRN: [id number] DOB: Aug 08, 1956 Today's Date: 08/23/2018    History of Present Illness Pt is a 63 yo female with diagnosis of breast cancer stage IV admitted to ED on 1/6 for pain. Pt recently admitted on 12/26 for LBP with R leg radiations x 1 month. Pt also with L arm numbness. Imaging revealed cervical and thoracic spine pathologic fractures, and lesions to spine. Other notable PMH includes SVT, back surgery.      PT Comments    Pt is extremely limited by pain at this time, she is unable to even perform bed level exercises d/t the extent of her back and LE pain; pt's extensive spinal mets are concerning for continued mobilization with PT---possibly pt would benefit from neuro evaluation to advise for any restrictions as MRI/Radiologist report states  "Diffuse metastatic disease involving the entire cervical and thoracic spine with multiple pathologic fractures. The patient is at substantial risk for more pathologic fractures."   Will continue to follow in acute setting however pt's potential for rehab is guarded at this point, if pain is not improved may benefit from further conversation with Palliative Care Team.   Follow Up Recommendations  SNF;Supervision/Assistance - 24 hour     Equipment Recommendations  Other (comment)(defer to SNF)    Recommendations for Other Services       Precautions / Restrictions Precautions Precautions: Fall;Back Precaution Comments: Mets and pathologic fractues to C- and T-spine, log rolling technique utilized    Mobility  Bed Mobility               General bed mobility comments: deferred d/t pain level  Transfers                    Ambulation/Gait                 Stairs             Wheelchair Mobility    Modified Rankin (Stroke Patients Only)       Balance                                            Cognition  Arousal/Alertness: Awake/alert Behavior During Therapy: WFL for tasks assessed/performed;Anxious Overall Cognitive Status: Within Functional Limits for tasks assessed                                        Exercises Total Joint Exercises Ankle Circles/Pumps: AROM;Both;10 reps;Supine Quad Sets: AROM;Both;10 reps;Limitations Quad Sets Limitations: trace contraction on L d/t weakness and pain Heel Slides: AAROM;Right;Other reps (comment);Limitations Heel Slides Limitations: attempted x4, pt limited by back and hip pain; unable to initiate movement on L d/t pain Hip ABduction/ADduction: AAROM;Right;5 reps;Limitations Hip Abduction/Adduction Limitations: pain  Tolerates AROM bil UEs, reports some pain in L shoulder with AROM    General Comments        Pertinent Vitals/Pain Pain Assessment: 0-10 Pain Score: 10-Worst pain ever Pain Location: bilateral hips and legs, back  Pain Descriptors / Indicators: Grimacing;Guarding;Jabbing Pain Intervention(s): Limited activity within patient's tolerance;Monitored during session    Home Living                      Prior Function  PT Goals (current goals can now be found in the care plan section) Acute Rehab PT Goals Patient Stated Goal: decrease pain, increase leg strength  PT Goal Formulation: With patient Time For Goal Achievement: 09/03/18 Potential to Achieve Goals: Fair Progress towards PT goals: Not progressing toward goals - comment(d/t pain/disease progression)    Frequency    Min 2X/week      PT Plan Current plan remains appropriate;Other (comment)    Co-evaluation              AM-PAC PT "6 Clicks" Mobility   Outcome Measure  Help needed turning from your back to your side while in a flat bed without using bedrails?: Total Help needed moving from lying on your back to sitting on the side of a flat bed without using bedrails?: Total Help needed moving to and from a bed to a  chair (including a wheelchair)?: Total Help needed standing up from a chair using your arms (e.g., wheelchair or bedside chair)?: Total Help needed to walk in hospital room?: Total Help needed climbing 3-5 steps with a railing? : Total 6 Click Score: 6    End of Session   Activity Tolerance: Patient limited by pain Patient left: in bed;with call bell/phone within reach;with bed alarm set;with nursing/sitter in room   PT Visit Diagnosis: Other abnormalities of gait and mobility (R26.89);Muscle weakness (generalized) (M62.81)     Time: 6759-1638 PT Time Calculation (min) (ACUTE ONLY): 14 min  Charges:  $Therapeutic Exercise: 8-22 mins                     Kenyon Ana, PT  Pager: [phone] Acute Rehab Dept [facility]): [phone]   08/23/2018    [facility] 08/23/2018, 11:38 AM

## 2018-08-24 LAB — BASIC METABOLIC PANEL
Anion gap: 9 (ref 5–15)
BUN: 41 mg/dL — AB (ref 8–23)
CO2: 26 mmol/L (ref 22–32)
CREATININE: 0.3 mg/dL — AB (ref 0.44–1.00)
Calcium: 8.4 mg/dL — ABNORMAL LOW (ref 8.9–10.3)
Chloride: 102 mmol/L (ref 98–111)
GFR calc Af Amer: >60 mL/min (ref >60)
GFR calc non Af Amer: >60 mL/min (ref >60)
Glucose, Bld: 172 mg/dL — ABNORMAL HIGH (ref 70–99)
Potassium: 3.8 mmol/L (ref 3.5–5.1)
SODIUM: 137 mmol/L (ref 135–145)

## 2018-08-24 LAB — CBC
HCT: 40.2 % (ref 36.0–46.0)
Hemoglobin: 13 g/dL (ref 12.0–15.0)
MCH: 28.3 pg (ref 26.0–34.0)
MCHC: 32.3 g/dL (ref 30.0–36.0)
MCV: 87.6 fL (ref 80.0–100.0)
Platelets: 112 10*3/uL — ABNORMAL LOW (ref 150–400)
RBC: 4.59 MIL/uL (ref 3.87–5.11)
RDW: 17 % — ABNORMAL HIGH (ref 11.5–15.5)
WBC: 3.2 10*3/uL — ABNORMAL LOW (ref 4.0–10.5)
nRBC: 0.6 % — ABNORMAL HIGH (ref 0.0–0.2)

## 2018-08-24 MED ORDER — BISACODYL 10 MG RE SUPP
10.0000 mg | Freq: Once | RECTAL | Status: DC
  Filled 2018-08-24: qty 1

## 2018-08-24 NOTE — Progress Notes (Addendum)
PROGRESS NOTE  Kristina Mcfarland PPJ:[id number] DOB: [birth year] DOA: 08/19/2018 PCP: Willeen Niece, PA  HPI/Recap of past 41 hours: 63 year old woman PMH metastatic breast cancer with extensive involvement of the cervical, thoracic and lumbar spine, diagnosed late December 2019, currently receiving chemotherapy, presented with worsening back pain, leg spasms, inability to ambulate.    Discharged 1/5 after being evaluated for new diagnosis of metastatic breast cancer, cancer related pain.  SNF was recommended but could not be pursued apparently secondary to insurance.  08/21/2018: Patient seen and examined at bedside.  No acute events overnight.  Reports intermittent back pain which is well controlled on current pain medications.  Ongoing radiation therapy.  Patient will need SNF at discharge per PT recommendation.  CSW has been consulted.  08/22/2018: Patient seen and examined at bedside.  Reports muscle spasm with minimal movement.  Flexeril dose increased to 7.5 3 times daily as needed for muscle spasm.  08/23/2018: Patient seen and examined at bedside.  Reports still has a lot of pain in her lower extremities bilaterally and some numbness in her left upper extremity affecting left fourth and fifth digits.  Obtain repeat MRI of cervical, thoracic and lumbar spine.  08/24/18: Patient seen and examined at bedside.  Still reports severe pain requiring IV pain medications.  Palliative care team following for symptoms management.  Also feels constipated.  Increase dose of bowel regimen meds.  Assessment/Plan: Active Problems:   Intractable back pain   Weakness of both lower extremities  Intractable back and lower extremity pain secondary to mets to bones in the setting of breast cancer Patient is currently receiving chemotherapy and ongoing radiation therapy Dr. Burr Medico is following Pain management and bowel regimen in place Fall precautions Dependent reviewed MRI of spine which revealed mets affecting  cervical, thoracic and lumbar regions Palliative care team consulted to assist with symptoms management and goals of care Flexeril 7.5 mg as needed for muscle spasm 3 times daily prn Lidocaine patch Palliative care consulted for symptom management.  Highly appreciated. Repeat MRI cervical, thoracic and lumbar spine  Constipation Start Dulcolax suppository Continue Senokot 2 tablets twice daily and MiraLAX daily  Ambulatory dysfunction PT recommends SNF CSW consulted to assist with SNF placement with palliative care services Fall precautions  Pathological fracture in the setting of breast cancer with mets to bone Fall precautions Ongoing radiation therapy Oncology following Pain management     DVT prophylaxis:  Subcu Lovenox daily  Code Status: Full Family Communication:  None at bedside Disposition Plan: SNF when hemodynamically stable and oncology/radiation oncology sign off.    Objective: Vitals:   08/23/18 1341 08/23/18 2005 08/24/18 0429 08/24/18 1402  BP: 108/80 110/74 105/79 104/77  Pulse: 99 90 96 97  Resp: 16 18 14 17   Temp: 98.1 F (36.7 C) 98.2 F (36.8 C) 98.2 F (36.8 C) 97.9 F (36.6 C)  TempSrc: Oral Oral Oral Oral  SpO2: 100% 99% 100% 97%  Weight:      Height:        Intake/Output Summary (Last 24 hours) at 08/24/2018 1402 Last data filed at 08/24/2018 0429 Gross per 24 hour  Intake -  Output 700 ml  Net -700 ml   Filed Weights   08/19/18 1848 08/20/18 0437  Weight: 54.4 kg 57.1 kg    Exam:  . General: 63 y.o. year-old female developed well-nourished.  Appears uncomfortable due to back pain.  Alert and oriented x3. . Cardiovascular: Regular rate and rhythm with no rubs or gallops.  No JVD or thyromegaly noted.   Marland Kitchen Respiratory: Clear to auscultation with no wheezes or rales.  Poor inspiratory effort. . Abdomen: Soft nontender nondistended with normal bowel sounds x4 quadrants. . Musculoskeletal: Trace lower extremity edema. 2/4 pulses in  all 4 extremities. Marland Kitchen Psychiatry: Mood is appropriate for condition and setting   Data Reviewed: CBC: Recent Labs  Lab 08/19/18 2032 08/20/18 0625 08/22/18 0716 08/23/18 0333 08/24/18 0626  WBC 14.8* 10.4 9.3 5.3 3.2*  NEUTROABS 12.5*  --   --   --   --   HGB 15.4* 13.6 13.0 12.7 13.0  HCT 46.8* 42.8 41.3 39.1 40.2  MCV 87.5 90.5 90.2 86.9 87.6  PLT 207 173 127* 112* 008*   Basic Metabolic Panel: Recent Labs  Lab 08/19/18 2031 08/19/18 2032 08/20/18 0625 08/22/18 0716 08/23/18 0333 08/24/18 0626  NA  --  134*  --  134* 135 137  K  --  4.2  --  4.5 4.0 3.8  CL  --  100  --  101 103 102  CO2  --  23  --  24 25 26   GLUCOSE  --  146*  --  189* 231* 172*  BUN  --  41*  --  48* 34* 41*  CREATININE  --  0.46 0.51 0.42* 0.38* 0.30*  CALCIUM  --  8.6*  --  8.2* 8.2* 8.4*  MG 2.2  --   --   --   --   --   PHOS 2.9  --   --   --   --   --    GFR: Estimated Creatinine Clearance: 57.7 mL/min (A) (by C-G formula based on SCr of 0.3 mg/dL (L)). Liver Function Tests: No results for input(s): AST, ALT, ALKPHOS, BILITOT, PROT, ALBUMIN in the last 168 hours. No results for input(s): LIPASE, AMYLASE in the last 168 hours. No results for input(s): AMMONIA in the last 168 hours. Coagulation Profile: No results for input(s): INR, PROTIME in the last 168 hours. Cardiac Enzymes: No results for input(s): CKTOTAL, CKMB, CKMBINDEX, TROPONINI in the last 168 hours. BNP (last 3 results) No results for input(s): PROBNP in the last 8760 hours. HbA1C: Recent Labs    08/23/18 0333  HGBA1C 6.5*   CBG: No results for input(s): GLUCAP in the last 168 hours. Lipid Profile: No results for input(s): CHOL, HDL, LDLCALC, TRIG, CHOLHDL, LDLDIRECT in the last 72 hours. Thyroid Function Tests: No results for input(s): TSH, T4TOTAL, FREET4, T3FREE, THYROIDAB in the last 72 hours. Anemia Panel: No results for input(s): VITAMINB12, FOLATE, FERRITIN, TIBC, IRON, RETICCTPCT in the last 72 hours. Urine  analysis:    Component Value Date/Time   COLORURINE YELLOW 08/21/2018 1045   APPEARANCEUR CLOUDY (A) 08/21/2018 1045   LABSPEC 1.025 08/21/2018 1045   PHURINE 6.0 08/21/2018 1045   GLUCOSEU >=500 (A) 08/21/2018 1045   HGBUR NEGATIVE 08/21/2018 [place] 08/21/2018 1045   KETONESUR 5 (A) 08/21/2018 1045   PROTEINUR NEGATIVE 08/21/2018 1045   NITRITE NEGATIVE 08/21/2018 1045   LEUKOCYTESUR NEGATIVE 08/21/2018 1045   Sepsis Labs: @LABRCNTIP (procalcitonin:4,lacticidven:4)  )No results found for this or any previous visit (from the past 240 hour(s)).    Studies: Mr Cervical Spine W Wo Contrast  Result Date: 08/23/2018 CLINICAL DATA:  Initial evaluation for acute numbness and tingling. History of breast cancer. EXAM: MRI TOTAL SPINE WITHOUT AND WITH CONTRAST TECHNIQUE: Multisequence MR imaging of the spine from the cervical spine to the sacrum was performed prior  to and following IV contrast administration for evaluation of spinal metastatic disease. CONTRAST:  6 cc of Gadavist. COMPARISON:  Recent MRI from 104-29-2019. FINDINGS: MRI CERVICAL SPINE FINDINGS Alignment: Stable alignment with preservation of the normal cervical lordosis. No listhesis or subluxation. Vertebrae: Widespread osseous metastatic disease again seen throughout the cervical spine, not significantly changed in appearance relative to recent MRI. Associated pathologic fractures of the C4, T1, T3, and T5 vertebral bodies again seen, stable. No new or interval height loss. Tumor again seen involving both the vertebral bodies as well as the posterior elements. Involvement of the partially visualized skull base and calvarium noted as well. Diffuse dural enhancement involving the epidural space likely related to tumor infiltration, also similar to previous. This extends from the skull base through approximately T3. Cord: Signal intensity within the cervical spinal cord is within normal limits. No evidence for  intramedullary metastatic disease. Posterior Fossa, vertebral arteries, paraspinal tissues: Visualized brain and posterior fossa within normal limits. Craniocervical junction normal. Paraspinous and prevertebral soft tissues demonstrate no acute finding. Normal intravascular flow voids seen within the vertebral arteries bilaterally. Disc levels: C2-C3: No significant disc bulge. Left-sided facet hypertrophy. Prominent multifocal tumor throughout the C2 vertebral body with probable involvement of the left neural foramen, stable. No significant spinal stenosis. C3-C4: Pathologic fracture of the C4 vertebral body with associated 3 mm bony retropulsion. Superimposed left eccentric disc osteophyte. Epidural enhancement compatible with tumor with probable involvement of the bilateral neural foramina. Moderate spinal stenosis, stable. C4-C5: Mild diffuse disc bulge with bilateral uncovertebral and facet hypertrophy. Moderate spinal stenosis, stable. Moderate bilateral foraminal narrowing with enhancing epidural tumor within the bilateral neural foramina, stable. C5-C6: Circumferential disc osteophyte with intervertebral disc space narrowing. Broad posterior component flattens the ventral thecal sac with resultant moderate spinal stenosis. Moderate to severe bilateral C6 foraminal stenosis. Epidural enhancement. C6-C7: No significant disc protrusion. Bilateral facet hypertrophy. Epidural enhancement compatible with tumor. No significant spinal stenosis or neural foraminal narrowing. C7-T1: No significant disc protrusion. Left-sided facet arthrosis. Epidural enhancement compatible with tumor. MRI THORACIC SPINE FINDINGS Alignment:  Physiologic. Vertebrae: Widespread osseous metastases seen throughout the thoracic spine, involving essentially all levels, with involvement of the vertebral bodies as well as the posterior elements. Associated pathologic fractures of T1, T3, and T5, stable. Mild concavity at the endplates of T8  and T9 also unchanged. No new fracture. Expansion of several pedicles with probable early osseous extension of tumor into the adjacent foramina seen at several levels, most notable at T10 on the right. Innumerable rib lesions noted as well. Cord: Signal intensity within the thoracic spinal cord is normal. No intramedullary metastatic disease. Conus terminates at L1. Paraspinal and other soft tissues: Paraspinous soft tissues demonstrate no acute finding. Partially visualized lungs are clear. Simple T2 hyperintense cyst noted within the right kidney. Disc levels: T4-5: Prominent tumor within the left pedicle mildly indents the left dorsal thecal sac (series 15, image 14). No significant spinal stenosis. Tumor involving the posterior lamina of T7 mildly compresses and flattens the dorsal thecal sac (series 15, image 23). No other significant spinal stenosis within the thoracic spine. Overall, appearance is stable from previous. MRI LUMBAR SPINE FINDINGS Segmentation: Standard. Lowest well-formed disc labeled the L5-S1 level. Alignment: Normal alignment with preservation of the normal lumbar lordosis. No listhesis. Vertebrae: Widespread osseous metastases seen throughout the lumbar spine and visualized sacrum and pelvis. Involvement of all levels, with involvement of the vertebral bodies as well as the posterior elements. Associated pathologic fractures at  the superior endplates of L1, L3, L4, and L5 without significant bony retropulsion. Conus medullaris: Extends to the L1 level and appears normal. No enhancing tumor seen involving the conus medullaris or nerve roots of the cauda equina. Paraspinal and other soft tissues: Paraspinous soft tissues demonstrate no acute finding. Simple right renal cyst partially visualized. Visualized visceral structures otherwise unremarkable. Disc levels: L1-2: No significant disc bulge. Tumor involving the right posterior aspect of L2 with extension into the right pedicle indents the  right ventral thecal sac with mild canal narrowing (series 23, image 12). Superimposed mild facet hypertrophy. Foramina remain patent. L2-3: No significant disc bulge. Mild bilateral facet hypertrophy. Short pedicles with resultant mild spinal stenosis. Foramina remain patent. L3-4: Mild diffuse disc bulge, eccentric to the left. Moderate facet and ligament flavum hypertrophy. Short pedicles. Resultant moderate spinal stenosis. Foramina remain patent. L4-5: Broad-based posterior disc bulge with associated annular fissure. Moderate facet and ligament flavum hypertrophy. Resultant moderate spinal stenosis, largely due to short pedicles. Foramina remain patent. L5-S1: Intervertebral disc space narrowing without significant disc bulge. Posterior endplate osseous ridging, greater on the right. Endplate osteophyte contacts the descending right S1 nerve root as it courses through the lateral recess (series 23, image 32). Additionally, early tumoral encroachment upon the right S1 foramen within the sacrum (series 23, image 35). Superimposed mild epidural lipomatosis. No significant spinal stenosis. Foramina remain patent. IMPRESSION: 1. Diffuse osseous metastatic disease involving the cervical, thoracic, and lumbar spine, stable in appearance relative to recent MRI from 12020/03/1018. Multiple associated pathologic compression fractures involving the C4, T1, T3, T5, L1, L3, L4, and L5. 2. Mild diffuse epidural enhancement extending from the skull base through approximately the level of T3, compatible with epidural tumor. No significant spinal cord compression at this time. 3. Superimposed mild to moderate multilevel degenerative spondylolysis as above. Changes are most notable within the cervical spine were there is resultant moderate diffuse spinal stenosis at C3-4 through C5-6. Electronically Signed   By: Jeannine Boga M.D.   On: 08/23/2018 23:11   Mr Thoracic Spine W Wo Contrast  Result Date: 08/23/2018 CLINICAL  DATA:  Initial evaluation for acute numbness and tingling. History of breast cancer. EXAM: MRI TOTAL SPINE WITHOUT AND WITH CONTRAST TECHNIQUE: Multisequence MR imaging of the spine from the cervical spine to the sacrum was performed prior to and following IV contrast administration for evaluation of spinal metastatic disease. CONTRAST:  6 cc of Gadavist. COMPARISON:  Recent MRI from 12020/03/1018. FINDINGS: MRI CERVICAL SPINE FINDINGS Alignment: Stable alignment with preservation of the normal cervical lordosis. No listhesis or subluxation. Vertebrae: Widespread osseous metastatic disease again seen throughout the cervical spine, not significantly changed in appearance relative to recent MRI. Associated pathologic fractures of the C4, T1, T3, and T5 vertebral bodies again seen, stable. No new or interval height loss. Tumor again seen involving both the vertebral bodies as well as the posterior elements. Involvement of the partially visualized skull base and calvarium noted as well. Diffuse dural enhancement involving the epidural space likely related to tumor infiltration, also similar to previous. This extends from the skull base through approximately T3. Cord: Signal intensity within the cervical spinal cord is within normal limits. No evidence for intramedullary metastatic disease. Posterior Fossa, vertebral arteries, paraspinal tissues: Visualized brain and posterior fossa within normal limits. Craniocervical junction normal. Paraspinous and prevertebral soft tissues demonstrate no acute finding. Normal intravascular flow voids seen within the vertebral arteries bilaterally. Disc levels: C2-C3: No significant disc bulge. Left-sided facet hypertrophy. Prominent  multifocal tumor throughout the C2 vertebral body with probable involvement of the left neural foramen, stable. No significant spinal stenosis. C3-C4: Pathologic fracture of the C4 vertebral body with associated 3 mm bony retropulsion. Superimposed left  eccentric disc osteophyte. Epidural enhancement compatible with tumor with probable involvement of the bilateral neural foramina. Moderate spinal stenosis, stable. C4-C5: Mild diffuse disc bulge with bilateral uncovertebral and facet hypertrophy. Moderate spinal stenosis, stable. Moderate bilateral foraminal narrowing with enhancing epidural tumor within the bilateral neural foramina, stable. C5-C6: Circumferential disc osteophyte with intervertebral disc space narrowing. Broad posterior component flattens the ventral thecal sac with resultant moderate spinal stenosis. Moderate to severe bilateral C6 foraminal stenosis. Epidural enhancement. C6-C7: No significant disc protrusion. Bilateral facet hypertrophy. Epidural enhancement compatible with tumor. No significant spinal stenosis or neural foraminal narrowing. C7-T1: No significant disc protrusion. Left-sided facet arthrosis. Epidural enhancement compatible with tumor. MRI THORACIC SPINE FINDINGS Alignment:  Physiologic. Vertebrae: Widespread osseous metastases seen throughout the thoracic spine, involving essentially all levels, with involvement of the vertebral bodies as well as the posterior elements. Associated pathologic fractures of T1, T3, and T5, stable. Mild concavity at the endplates of T8 and T9 also unchanged. No new fracture. Expansion of several pedicles with probable early osseous extension of tumor into the adjacent foramina seen at several levels, most notable at T10 on the right. Innumerable rib lesions noted as well. Cord: Signal intensity within the thoracic spinal cord is normal. No intramedullary metastatic disease. Conus terminates at L1. Paraspinal and other soft tissues: Paraspinous soft tissues demonstrate no acute finding. Partially visualized lungs are clear. Simple T2 hyperintense cyst noted within the right kidney. Disc levels: T4-5: Prominent tumor within the left pedicle mildly indents the left dorsal thecal sac (series 15, image  14). No significant spinal stenosis. Tumor involving the posterior lamina of T7 mildly compresses and flattens the dorsal thecal sac (series 15, image 23). No other significant spinal stenosis within the thoracic spine. Overall, appearance is stable from previous. MRI LUMBAR SPINE FINDINGS Segmentation: Standard. Lowest well-formed disc labeled the L5-S1 level. Alignment: Normal alignment with preservation of the normal lumbar lordosis. No listhesis. Vertebrae: Widespread osseous metastases seen throughout the lumbar spine and visualized sacrum and pelvis. Involvement of all levels, with involvement of the vertebral bodies as well as the posterior elements. Associated pathologic fractures at the superior endplates of L1, L3, L4, and L5 without significant bony retropulsion. Conus medullaris: Extends to the L1 level and appears normal. No enhancing tumor seen involving the conus medullaris or nerve roots of the cauda equina. Paraspinal and other soft tissues: Paraspinous soft tissues demonstrate no acute finding. Simple right renal cyst partially visualized. Visualized visceral structures otherwise unremarkable. Disc levels: L1-2: No significant disc bulge. Tumor involving the right posterior aspect of L2 with extension into the right pedicle indents the right ventral thecal sac with mild canal narrowing (series 23, image 12). Superimposed mild facet hypertrophy. Foramina remain patent. L2-3: No significant disc bulge. Mild bilateral facet hypertrophy. Short pedicles with resultant mild spinal stenosis. Foramina remain patent. L3-4: Mild diffuse disc bulge, eccentric to the left. Moderate facet and ligament flavum hypertrophy. Short pedicles. Resultant moderate spinal stenosis. Foramina remain patent. L4-5: Broad-based posterior disc bulge with associated annular fissure. Moderate facet and ligament flavum hypertrophy. Resultant moderate spinal stenosis, largely due to short pedicles. Foramina remain patent. L5-S1:  Intervertebral disc space narrowing without significant disc bulge. Posterior endplate osseous ridging, greater on the right. Endplate osteophyte contacts the descending right S1 nerve root  as it courses through the lateral recess (series 23, image 32). Additionally, early tumoral encroachment upon the right S1 foramen within the sacrum (series 23, image 35). Superimposed mild epidural lipomatosis. No significant spinal stenosis. Foramina remain patent. IMPRESSION: 1. Diffuse osseous metastatic disease involving the cervical, thoracic, and lumbar spine, stable in appearance relative to recent MRI from 1Feb 21, 202019. Multiple associated pathologic compression fractures involving the C4, T1, T3, T5, L1, L3, L4, and L5. 2. Mild diffuse epidural enhancement extending from the skull base through approximately the level of T3, compatible with epidural tumor. No significant spinal cord compression at this time. 3. Superimposed mild to moderate multilevel degenerative spondylolysis as above. Changes are most notable within the cervical spine were there is resultant moderate diffuse spinal stenosis at C3-4 through C5-6. Electronically Signed   By: Jeannine Boga M.D.   On: 08/23/2018 23:11   Mr Lumbar Spine W Wo Contrast  Result Date: 08/23/2018 CLINICAL DATA:  Initial evaluation for acute numbness and tingling. History of breast cancer. EXAM: MRI TOTAL SPINE WITHOUT AND WITH CONTRAST TECHNIQUE: Multisequence MR imaging of the spine from the cervical spine to the sacrum was performed prior to and following IV contrast administration for evaluation of spinal metastatic disease. CONTRAST:  6 cc of Gadavist. COMPARISON:  Recent MRI from 1Feb 21, 202019. FINDINGS: MRI CERVICAL SPINE FINDINGS Alignment: Stable alignment with preservation of the normal cervical lordosis. No listhesis or subluxation. Vertebrae: Widespread osseous metastatic disease again seen throughout the cervical spine, not significantly changed in appearance  relative to recent MRI. Associated pathologic fractures of the C4, T1, T3, and T5 vertebral bodies again seen, stable. No new or interval height loss. Tumor again seen involving both the vertebral bodies as well as the posterior elements. Involvement of the partially visualized skull base and calvarium noted as well. Diffuse dural enhancement involving the epidural space likely related to tumor infiltration, also similar to previous. This extends from the skull base through approximately T3. Cord: Signal intensity within the cervical spinal cord is within normal limits. No evidence for intramedullary metastatic disease. Posterior Fossa, vertebral arteries, paraspinal tissues: Visualized brain and posterior fossa within normal limits. Craniocervical junction normal. Paraspinous and prevertebral soft tissues demonstrate no acute finding. Normal intravascular flow voids seen within the vertebral arteries bilaterally. Disc levels: C2-C3: No significant disc bulge. Left-sided facet hypertrophy. Prominent multifocal tumor throughout the C2 vertebral body with probable involvement of the left neural foramen, stable. No significant spinal stenosis. C3-C4: Pathologic fracture of the C4 vertebral body with associated 3 mm bony retropulsion. Superimposed left eccentric disc osteophyte. Epidural enhancement compatible with tumor with probable involvement of the bilateral neural foramina. Moderate spinal stenosis, stable. C4-C5: Mild diffuse disc bulge with bilateral uncovertebral and facet hypertrophy. Moderate spinal stenosis, stable. Moderate bilateral foraminal narrowing with enhancing epidural tumor within the bilateral neural foramina, stable. C5-C6: Circumferential disc osteophyte with intervertebral disc space narrowing. Broad posterior component flattens the ventral thecal sac with resultant moderate spinal stenosis. Moderate to severe bilateral C6 foraminal stenosis. Epidural enhancement. C6-C7: No significant disc  protrusion. Bilateral facet hypertrophy. Epidural enhancement compatible with tumor. No significant spinal stenosis or neural foraminal narrowing. C7-T1: No significant disc protrusion. Left-sided facet arthrosis. Epidural enhancement compatible with tumor. MRI THORACIC SPINE FINDINGS Alignment:  Physiologic. Vertebrae: Widespread osseous metastases seen throughout the thoracic spine, involving essentially all levels, with involvement of the vertebral bodies as well as the posterior elements. Associated pathologic fractures of T1, T3, and T5, stable. Mild concavity at the endplates of T8 and  T9 also unchanged. No new fracture. Expansion of several pedicles with probable early osseous extension of tumor into the adjacent foramina seen at several levels, most notable at T10 on the right. Innumerable rib lesions noted as well. Cord: Signal intensity within the thoracic spinal cord is normal. No intramedullary metastatic disease. Conus terminates at L1. Paraspinal and other soft tissues: Paraspinous soft tissues demonstrate no acute finding. Partially visualized lungs are clear. Simple T2 hyperintense cyst noted within the right kidney. Disc levels: T4-5: Prominent tumor within the left pedicle mildly indents the left dorsal thecal sac (series 15, image 14). No significant spinal stenosis. Tumor involving the posterior lamina of T7 mildly compresses and flattens the dorsal thecal sac (series 15, image 23). No other significant spinal stenosis within the thoracic spine. Overall, appearance is stable from previous. MRI LUMBAR SPINE FINDINGS Segmentation: Standard. Lowest well-formed disc labeled the L5-S1 level. Alignment: Normal alignment with preservation of the normal lumbar lordosis. No listhesis. Vertebrae: Widespread osseous metastases seen throughout the lumbar spine and visualized sacrum and pelvis. Involvement of all levels, with involvement of the vertebral bodies as well as the posterior elements. Associated  pathologic fractures at the superior endplates of L1, L3, L4, and L5 without significant bony retropulsion. Conus medullaris: Extends to the L1 level and appears normal. No enhancing tumor seen involving the conus medullaris or nerve roots of the cauda equina. Paraspinal and other soft tissues: Paraspinous soft tissues demonstrate no acute finding. Simple right renal cyst partially visualized. Visualized visceral structures otherwise unremarkable. Disc levels: L1-2: No significant disc bulge. Tumor involving the right posterior aspect of L2 with extension into the right pedicle indents the right ventral thecal sac with mild canal narrowing (series 23, image 12). Superimposed mild facet hypertrophy. Foramina remain patent. L2-3: No significant disc bulge. Mild bilateral facet hypertrophy. Short pedicles with resultant mild spinal stenosis. Foramina remain patent. L3-4: Mild diffuse disc bulge, eccentric to the left. Moderate facet and ligament flavum hypertrophy. Short pedicles. Resultant moderate spinal stenosis. Foramina remain patent. L4-5: Broad-based posterior disc bulge with associated annular fissure. Moderate facet and ligament flavum hypertrophy. Resultant moderate spinal stenosis, largely due to short pedicles. Foramina remain patent. L5-S1: Intervertebral disc space narrowing without significant disc bulge. Posterior endplate osseous ridging, greater on the right. Endplate osteophyte contacts the descending right S1 nerve root as it courses through the lateral recess (series 23, image 32). Additionally, early tumoral encroachment upon the right S1 foramen within the sacrum (series 23, image 35). Superimposed mild epidural lipomatosis. No significant spinal stenosis. Foramina remain patent. IMPRESSION: 1. Diffuse osseous metastatic disease involving the cervical, thoracic, and lumbar spine, stable in appearance relative to recent MRI from 1Oct 13, 202019. Multiple associated pathologic compression fractures  involving the C4, T1, T3, T5, L1, L3, L4, and L5. 2. Mild diffuse epidural enhancement extending from the skull base through approximately the level of T3, compatible with epidural tumor. No significant spinal cord compression at this time. 3. Superimposed mild to moderate multilevel degenerative spondylolysis as above. Changes are most notable within the cervical spine were there is resultant moderate diffuse spinal stenosis at C3-4 through C5-6. Electronically Signed   By: Jeannine Boga M.D.   On: 08/23/2018 23:11    Scheduled Meds: . dexamethasone  8 mg Oral Q8H  . DULoxetine  30 mg Oral Daily  . enoxaparin (LOVENOX) injection  40 mg Subcutaneous Q24H  . famotidine  20 mg Oral BID  . feeding supplement (ENSURE ENLIVE)  237 mL Oral BID BM  .  gabapentin  100 mg Oral QHS  . lidocaine  1 patch Transdermal Q24H  . morphine  15 mg Oral Q12H  . polyethylene glycol  17 g Oral BID  . senna-docusate  2 tablet Oral BID    Continuous Infusions:   LOS: 4 days     Kayleen Memos, MD [facility] Pager [phone]  If 7PM-7AM, please contact night-coverage [email or web address] Password [email or web address] 08/24/2018, 2:02 PM

## 2018-08-25 MED ORDER — MAGIC MOUTHWASH
2.0000 mL | Freq: Three times a day (TID) | ORAL | Status: DC
  Administered 2018-08-25 – 2018-08-27 (×6): 2 mL via ORAL
  Filled 2018-08-25 (×7): qty 5

## 2018-08-25 NOTE — Progress Notes (Signed)
PROGRESS NOTE  Kristina Mcfarland TKW:[id number] DOB: 10-Aug-1956 DOA: 08/19/2018 PCP: Willeen Niece, PA  HPI/Recap of past 60 hours: 63 year old woman PMH metastatic breast cancer with extensive involvement of the cervical, thoracic and lumbar spine, diagnosed late December 2019, currently receiving chemotherapy, presented with worsening back pain, leg spasms, inability to ambulate.    Discharged 1/5 after being evaluated for new diagnosis of metastatic breast cancer, cancer related pain.  SNF was recommended but could not be pursued apparently secondary to insurance.  08/21/2018: Will need SNF at discharge per PT recommendation.  CSW has been consulted. 08/22/2018: Muscle spasm with minimal movement.  Flexeril dose increased to 7.5 3 times daily as needed for muscle spasm. 08/23/2018: Lot of pain in her lower extremities bilaterally and some numbness in her left upper extremity affecting left fourth and fifth digits.  Obtain repeat MRI of cervical, thoracic and lumbar spine.  08/24/18: Palliative care team following for symptoms management.  Also feels constipated.  Increase dose of bowel regimen meds.  08/25/2018: Patient seen and examined with son at bedside.  No acute events overnight.  States her most current pain regimen is working.  Reports persistent soreness when she swallows.  Evidence of oral thrush on exam.  Magic mouthwash started.  Assessment/Plan: Active Problems:   Intractable back pain   Weakness of both lower extremities  Intractable back and lower extremity pain secondary to mets to bones in the setting of breast cancer Patient is currently receiving chemotherapy and ongoing radiation therapy Dr. Burr Medico is following Pain management and bowel regimen in place Fall precautions  Oral thrush Noted on physical exam Start Magic mouthwash  Constipation, resolved Received Dulcolax suppository Continue Senokot 2 tablets twice daily and MiraLAX daily  Ambulatory dysfunction PT  recommends SNF CSW consulted to assist with SNF placement with palliative care services Fall precautions  Pathological fracture in the setting of breast cancer with mets to bone Fall precautions Ongoing radiation therapy Oncology following Pain management     DVT prophylaxis:  Subcu Lovenox daily  Code Status: Full Family Communication:  None at bedside Disposition Plan: SNF when hemodynamically stable and oncology/radiation oncology sign off.    Objective: Vitals:   08/24/18 0429 08/24/18 1402 08/24/18 2105 08/25/18 0558  BP: 105/79 104/77 98/79 (!) 115/58  Pulse: 96 97 97 96  Resp: 14 17 17 15   Temp: 98.2 F (36.8 C) 97.9 F (36.6 C) 99.1 F (37.3 C) (!) 97.5 F (36.4 C)  TempSrc: Oral Oral Oral Oral  SpO2: 100% 97% 98% 99%  Weight:      Height:       No intake or output data in the 24 hours ending 08/25/18 1523 Filed Weights   08/19/18 1848 08/20/18 0437  Weight: 54.4 kg 57.1 kg    Exam:  . General: 63 y.o. year-old female well-developed well-nourished in no acute distress.  Somnolent but easily arousable to voices. . Cardiovascular: Regular rate and rhythm with no rubs or gallops.  No JVD or thyromegaly. Marland Kitchen Respiratory: Clear to Auscultation with No Wheezes or Rales.  Poor Inspiratory Effort. . Abdomen: Soft nontender nondistended with normal bowel sounds x4 quadrants. . Musculoskeletal: Trace lower extremity edema. 2/4 pulses in all 4 extremities. Marland Kitchen Psychiatry: Mood is appropriate for condition and setting   Data Reviewed: CBC: Recent Labs  Lab 08/19/18 2032 08/20/18 0625 08/22/18 0716 08/23/18 0333 08/24/18 0626  WBC 14.8* 10.4 9.3 5.3 3.2*  NEUTROABS 12.5*  --   --   --   --  HGB 15.4* 13.6 13.0 12.7 13.0  HCT 46.8* 42.8 41.3 39.1 40.2  MCV 87.5 90.5 90.2 86.9 87.6  PLT 207 173 127* 112* 500*   Basic Metabolic Panel: Recent Labs  Lab 08/19/18 2031 08/19/18 2032 08/20/18 0625 08/22/18 0716 08/23/18 0333 08/24/18 0626  NA  --  134*   --  134* 135 137  K  --  4.2  --  4.5 4.0 3.8  CL  --  100  --  101 103 102  CO2  --  23  --  24 25 26   GLUCOSE  --  146*  --  189* 231* 172*  BUN  --  41*  --  48* 34* 41*  CREATININE  --  0.46 0.51 0.42* 0.38* 0.30*  CALCIUM  --  8.6*  --  8.2* 8.2* 8.4*  MG 2.2  --   --   --   --   --   PHOS 2.9  --   --   --   --   --    GFR: Estimated Creatinine Clearance: 57.7 mL/min (A) (by C-G formula based on SCr of 0.3 mg/dL (L)). Liver Function Tests: No results for input(s): AST, ALT, ALKPHOS, BILITOT, PROT, ALBUMIN in the last 168 hours. No results for input(s): LIPASE, AMYLASE in the last 168 hours. No results for input(s): AMMONIA in the last 168 hours. Coagulation Profile: No results for input(s): INR, PROTIME in the last 168 hours. Cardiac Enzymes: No results for input(s): CKTOTAL, CKMB, CKMBINDEX, TROPONINI in the last 168 hours. BNP (last 3 results) No results for input(s): PROBNP in the last 8760 hours. HbA1C: Recent Labs    08/23/18 0333  HGBA1C 6.5*   CBG: No results for input(s): GLUCAP in the last 168 hours. Lipid Profile: No results for input(s): CHOL, HDL, LDLCALC, TRIG, CHOLHDL, LDLDIRECT in the last 72 hours. Thyroid Function Tests: No results for input(s): TSH, T4TOTAL, FREET4, T3FREE, THYROIDAB in the last 72 hours. Anemia Panel: No results for input(s): VITAMINB12, FOLATE, FERRITIN, TIBC, IRON, RETICCTPCT in the last 72 hours. Urine analysis:    Component Value Date/Time   COLORURINE YELLOW 08/21/2018 1045   APPEARANCEUR CLOUDY (A) 08/21/2018 1045   LABSPEC 1.025 08/21/2018 1045   PHURINE 6.0 08/21/2018 1045   GLUCOSEU >=500 (A) 08/21/2018 1045   HGBUR NEGATIVE 08/21/2018 [place] 08/21/2018 1045   KETONESUR 5 (A) 08/21/2018 1045   PROTEINUR NEGATIVE 08/21/2018 1045   NITRITE NEGATIVE 08/21/2018 [place] 08/21/2018 1045   Sepsis Labs: @LABRCNTIP (procalcitonin:4,lacticidven:4)  )No results found for this or any  previous visit (from the past 240 hour(s)).    Studies: No results found.  Scheduled Meds: . bisacodyl  10 mg Rectal Once  . dexamethasone  8 mg Oral Q8H  . DULoxetine  30 mg Oral Daily  . enoxaparin (LOVENOX) injection  40 mg Subcutaneous Q24H  . famotidine  20 mg Oral BID  . feeding supplement (ENSURE ENLIVE)  237 mL Oral BID BM  . gabapentin  100 mg Oral QHS  . lidocaine  1 patch Transdermal Q24H  . magic mouthwash  2 mL Oral TID  . morphine  15 mg Oral Q12H  . polyethylene glycol  17 g Oral BID  . senna-docusate  2 tablet Oral BID    Continuous Infusions:   LOS: 5 days     Kayleen Memos, MD [facility] Pager [phone]  If 7PM-7AM, please contact night-coverage [email or web address] Password [facility] 08/25/2018, 3:23 PM

## 2018-08-26 ENCOUNTER — Ambulatory Visit: Payer: Self-pay

## 2018-08-26 ENCOUNTER — Ambulatory Visit
Admit: 2018-08-26 | Discharge: 2018-08-26 | Disposition: A | Discharge status: Home / Self Care | Payer: Self-pay | Attending: Radiation Oncology | Admitting: Radiation Oncology

## 2018-08-26 DIAGNOSIS — C50919 Malignant neoplasm of unspecified site of unspecified female breast: Secondary | ICD-10-CM

## 2018-08-26 NOTE — Progress Notes (Signed)
Daily Progress Note   Patient Name: Kristina Mcfarland       Date: 08/26/2018 DOB: 21-Apr-1956  Age: 63 y.o. MRN#: [id number] Attending Physician: Kayleen Memos, DO Primary Care Physician: Willeen Niece, [place] Admit Date: 08/19/2018  Reason for Consultation/Follow-up: Establishing goals of care, Non pain symptom management and Pain control  Subjective:  patient is awake alert resting in bed.  She had a bowel movement yesterday Pain is some what better controlled Son [name] is at the bedside  See below  Length of Stay: 6  Current Medications: Scheduled Meds:  . bisacodyl  10 mg Rectal Once  . dexamethasone  8 mg Oral Q8H  . DULoxetine  30 mg Oral Daily  . enoxaparin (LOVENOX) injection  40 mg Subcutaneous Q24H  . famotidine  20 mg Oral BID  . feeding supplement (ENSURE ENLIVE)  237 mL Oral BID BM  . gabapentin  100 mg Oral QHS  . lidocaine  1 patch Transdermal Q24H  . magic mouthwash  2 mL Oral TID  . morphine  15 mg Oral Q12H  . polyethylene glycol  17 g Oral BID  . senna-docusate  2 tablet Oral BID    Continuous Infusions:   PRN Meds: acetaminophen **OR** acetaminophen, bisacodyl, cyclobenzaprine, HYDROmorphone (DILAUDID) injection, ondansetron **OR** ondansetron (ZOFRAN) IV, oxyCODONE, sodium phosphate  Physical Exam         Awake alert Resting in bed Regular S 1 S 2  Abdomen is soft No edema  Vital Signs: BP 103/74 (BP Location: Right Arm)   Pulse 86   Temp 99.2 F (37.3 C) (Oral)   Resp 18   Ht '5\' 2"'  (1.575 m)   Wt 57.1 kg   SpO2 100%   BMI 23.02 kg/m  SpO2: SpO2: 100 % O2 Device: O2 Device: Room Air O2 Flow Rate:    Intake/output summary:   Intake/Output Summary (Last 24 hours) at 08/26/2018 1141 Last data filed at 08/26/2018 4720 Gross per 24 hour  Intake  260 ml  Output 1000 ml  Net -740 ml   LBM: Last BM Date: 08/25/18 Baseline Weight: Weight: 54.4 kg Most recent weight: Weight: 57.1 kg PPS 40%      Palliative Assessment/Data:    Flowsheet Rows     Most Recent Value  Intake Tab  Referral Department  Hospitalist  Unit at Time of Referral  Oncology Unit  Palliative Care Primary Diagnosis  Cancer  Date Notified  08/20/18  Palliative Care Type  Return patient Palliative Care  Reason for referral  Non-pain Symptom, Clarify Goals of Care  Date of Admission  08/20/18  Date first seen by Palliative Care  08/21/18  # of days Palliative referral response time  1 Day(s)  # of days IP prior to Palliative referral  0  Clinical Assessment  Psychosocial & Spiritual Assessment  Palliative Care Outcomes      Patient Active Problem List   Diagnosis Date Noted  . Intractable back pain 08/20/2018  . Weakness of both lower extremities 08/20/2018  . Palliative care by specialist   . Goals of care, counseling/discussion   . Cancer associated pain   . Constipation   . Bone metastasis ([place]) 08/08/2018  . Paroxysmal SVT (supraventricular tachycardia) ([place]) 08/08/2018  . LFT elevation 08/08/2018  . Back pain 08/08/2018    Palliative Care Assessment & Plan   Patient Profile:    Assessment:  63 y.o. postmenopausal Caucasian female, with past medical history of SVT, palpable left breast mass 4 months ago, presented with severe low back and bilateral hip pain  1.Diffuse metastatic breast cancer to bones, ER+/PR+/HER2 pending  2.Severe back pain and low extremity weakness, secondary to #1 3. History of SVT 4. Deconditioning      Recommendations/Plan:   Discussed with patient today, regarding her pain and non pain regimen. Also broadly discussed overall goals of care.  Patient awaits radiation treatment today, she states she also has to go for renal ultrasound today.   SNF rehab with palliative care following is recommended on  discharge.  Chaplain consult to complete advanced directives HCPOA, patient elects her son [name] to be her HCPOA agent.   Continue current mode of care for now.   Goals of Care and Additional Recommendations:  Limitations on Scope of Treatment: Full Scope Treatment  Code Status:    Code Status Orders  (From admission, onward)         Start     Ordered   08/20/18 0148  Full code  Continuous     08/20/18 0147        Code Status History    Date Active Date Inactive Code Status Order ID Comments User Context   08/08/2018 1307 08/18/2018 1728 Full Code [id number]  Kristina Milan, MD Inpatient       Prognosis:   Unable to determine  Discharge Planning:  [place] for rehab with Palliative care service follow-up  Care plan was discussed with  Patient and son   Thank you for allowing the Palliative Medicine Team to assist in the care of this patient.   Time In: 11 Time Out: 11.25 Total Time 25 Prolonged Time Billed  no       Greater than 50%  of this time was spent counseling and coordinating care related to the above assessment and plan.  Loistine Chance, MD [phone] Please contact Palliative Medicine Team phone at [phone] for questions and concerns.

## 2018-08-26 NOTE — Progress Notes (Signed)
[facility] Therapy Treatment Record Phone [phone]   Radiation Therapy was administered to Kristina Mcfarland on: 11/10/5186  4:25 PM and was treatment 9  out of a planned course of 10 treatments.  Radiation Treatment  1). Beam photons with 6-10 energy  2). Brachytherapy None  3). Stereotactic Radiosurgery None  4). Other Radiation None     Byrd Hesselbach, RT (T)

## 2018-08-26 NOTE — Progress Notes (Signed)
PROGRESS NOTE  Kristina Mcfarland MVH:[id number] DOB: 10/25/1955 DOA: 08/19/2018 PCP: Willeen Niece, PA  HPI/Recap of past 4 hours: 63 year old woman PMH metastatic breast cancer with extensive involvement of the cervical, thoracic and lumbar spine, diagnosed late December 2019, currently receiving chemotherapy, presented with worsening back pain, leg spasms, inability to ambulate.    Discharged 1/5 after being evaluated for new diagnosis of metastatic breast cancer, cancer related pain.  SNF was recommended but could not be pursued secondary to insurance.  Oral thrush noted on 08/25/2018.  Started nystatin mouthwash.  She states it is improving her symptoms.  08/26/2018: Patient seen and examined at her bedside.  No acute events overnight.  She states her pain is better controlled on current management however still experiences significant pain with minimal movement.  Plan for radiation therapy today.  Assessment/Plan: Active Problems:   Intractable back pain   Weakness of both lower extremities   Metastatic breast cancer (H[facility])  Intractable back and lower extremity pain secondary to mets to bones in the setting of breast cancer Patient is currently receiving chemotherapy and ongoing radiation therapy Dr. Burr Medico is following Pain management and bowel regimen in place Fall precautions Extensive cervical, thoracic and lumbar spine involvement affecting her mobility Still requires high doses of pain medications  Oral thrush Noted on physical exam Continue Magic mouthwash Obtain CBC in the morning  Constipation, resolved Received Dulcolax suppository Continue Senokot 2 tablets twice daily and MiraLAX daily  Ambulatory dysfunction PT recommends SNF CSW consulted to assist with SNF placement with palliative care services Fall precautions  Pathological fracture in the setting of breast cancer with mets to bone Fall precautions Ongoing radiation therapy Oncology following Pain  management and bowel regimen in place     DVT prophylaxis:  Subcu Lovenox daily  Code Status: Full Family Communication:  None at bedside Disposition Plan: SNF when hemodynamically stable and oncology/radiation oncology sign off.    Objective: Vitals:   08/25/18 1524 08/25/18 2109 08/26/18 0622 08/26/18 1336  BP: 106/76 107/78 103/74 126/90  Pulse: (!) 104 (!) 105 86 (!) 114  Resp: 18 16 18 16   Temp: 98.4 F (36.9 C) 98.3 F (36.8 C) 99.2 F (37.3 C) 98.1 F (36.7 C)  TempSrc: Oral Oral Oral Oral  SpO2: 98% 98% 100% 99%  Weight:      Height:        Intake/Output Summary (Last 24 hours) at 08/26/2018 1512 Last data filed at 08/26/2018 1220 Gross per 24 hour  Intake 594 ml  Output 1000 ml  Net -406 ml   Filed Weights   08/19/18 1848 08/20/18 0437  Weight: 54.4 kg 57.1 kg    Exam:  . General: 63 y.o. year-old female developed well-nourished in no acute distress.  Alert and oriented x3. . Cardiovascular: Regular rate and rhythm with no rubs or gallops.  No JVD or thyromegaly noted.   Marland Kitchen Respiratory: Clear to Auscultation with No Wheezes or Rales.  Poor inspiratory effort. . Abdomen: Soft nontender nondistended with normal bowel sounds x4 quadrants. . Musculoskeletal: Trace lower extremity edema. 2/4 pulses in all 4 extremities. Marland Kitchen Psychiatry: Mood is appropriate for condition and setting   Data Reviewed: CBC: Recent Labs  Lab 08/19/18 2032 08/20/18 0625 08/22/18 0716 08/23/18 0333 08/24/18 0626  WBC 14.8* 10.4 9.3 5.3 3.2*  NEUTROABS 12.5*  --   --   --   --   HGB 15.4* 13.6 13.0 12.7 13.0  HCT 46.8* 42.8 41.3 39.1 40.2  MCV 87.5  90.5 90.2 86.9 87.6  PLT 207 173 127* 112* 790*   Basic Metabolic Panel: Recent Labs  Lab 08/19/18 2031 08/19/18 2032 08/20/18 0625 08/22/18 0716 08/23/18 0333 08/24/18 0626  NA  --  134*  --  134* 135 137  K  --  4.2  --  4.5 4.0 3.8  CL  --  100  --  101 103 102  CO2  --  23  --  24 25 26   GLUCOSE  --  146*  --  189*  231* 172*  BUN  --  41*  --  48* 34* 41*  CREATININE  --  0.46 0.51 0.42* 0.38* 0.30*  CALCIUM  --  8.6*  --  8.2* 8.2* 8.4*  MG 2.2  --   --   --   --   --   PHOS 2.9  --   --   --   --   --    GFR: Estimated Creatinine Clearance: 57.7 mL/min (A) (by C-G formula based on SCr of 0.3 mg/dL (L)). Liver Function Tests: No results for input(s): AST, ALT, ALKPHOS, BILITOT, PROT, ALBUMIN in the last 168 hours. No results for input(s): LIPASE, AMYLASE in the last 168 hours. No results for input(s): AMMONIA in the last 168 hours. Coagulation Profile: No results for input(s): INR, PROTIME in the last 168 hours. Cardiac Enzymes: No results for input(s): CKTOTAL, CKMB, CKMBINDEX, TROPONINI in the last 168 hours. BNP (last 3 results) No results for input(s): PROBNP in the last 8760 hours. HbA1C: No results for input(s): HGBA1C in the last 72 hours. CBG: No results for input(s): GLUCAP in the last 168 hours. Lipid Profile: No results for input(s): CHOL, HDL, LDLCALC, TRIG, CHOLHDL, LDLDIRECT in the last 72 hours. Thyroid Function Tests: No results for input(s): TSH, T4TOTAL, FREET4, T3FREE, THYROIDAB in the last 72 hours. Anemia Panel: No results for input(s): VITAMINB12, FOLATE, FERRITIN, TIBC, IRON, RETICCTPCT in the last 72 hours. Urine analysis:    Component Value Date/Time   COLORURINE YELLOW 08/21/2018 1045   APPEARANCEUR CLOUDY (A) 08/21/2018 1045   LABSPEC 1.025 08/21/2018 1045   PHURINE 6.0 08/21/2018 1045   GLUCOSEU >=500 (A) 08/21/2018 1045   HGBUR NEGATIVE 08/21/2018 [place] 08/21/2018 1045   KETONESUR 5 (A) 08/21/2018 1045   PROTEINUR NEGATIVE 08/21/2018 1045   NITRITE NEGATIVE 08/21/2018 1045   LEUKOCYTESUR NEGATIVE 08/21/2018 1045   Sepsis Labs: @LABRCNTIP (procalcitonin:4,lacticidven:4)  )No results found for this or any previous visit (from the past 240 hour(s)).    Studies: No results found.  Scheduled Meds: . bisacodyl  10 mg Rectal Once    . dexamethasone  8 mg Oral Q8H  . DULoxetine  30 mg Oral Daily  . enoxaparin (LOVENOX) injection  40 mg Subcutaneous Q24H  . famotidine  20 mg Oral BID  . feeding supplement (ENSURE ENLIVE)  237 mL Oral BID BM  . gabapentin  100 mg Oral QHS  . lidocaine  1 patch Transdermal Q24H  . magic mouthwash  2 mL Oral TID  . morphine  15 mg Oral Q12H  . polyethylene glycol  17 g Oral BID  . senna-docusate  2 tablet Oral BID    Continuous Infusions:   LOS: 6 days     Kayleen Memos, MD [facility] Pager [phone]  If 7PM-7AM, please contact night-coverage [email or web address] Password [email or web address] 08/26/2018, 3:12 PM

## 2018-08-27 ENCOUNTER — Encounter: Payer: Self-pay | Admitting: Radiation Oncology

## 2018-08-27 ENCOUNTER — Ambulatory Visit
Admit: 2018-08-27 | Discharge: 2018-08-27 | Disposition: A | Discharge status: Home / Self Care | Payer: Self-pay | Attending: Radiation Oncology | Admitting: Radiation Oncology

## 2018-08-27 LAB — CBC
HCT: 37.5 % (ref 36.0–46.0)
Hemoglobin: 12.4 g/dL (ref 12.0–15.0)
MCH: 28.2 pg (ref 26.0–34.0)
MCHC: 33.1 g/dL (ref 30.0–36.0)
MCV: 85.2 fL (ref 80.0–100.0)
PLATELETS: 81 10*3/uL — AB (ref 150–400)
RBC: 4.4 MIL/uL (ref 3.87–5.11)
RDW: 16.7 % — ABNORMAL HIGH (ref 11.5–15.5)
WBC: 2.3 10*3/uL — ABNORMAL LOW (ref 4.0–10.5)
nRBC: 1.7 % — ABNORMAL HIGH (ref 0.0–0.2)

## 2018-08-27 LAB — BASIC METABOLIC PANEL
Anion gap: 8 (ref 5–15)
BUN: 32 mg/dL — ABNORMAL HIGH (ref 8–23)
CO2: 25 mmol/L (ref 22–32)
Calcium: 8 mg/dL — ABNORMAL LOW (ref 8.9–10.3)
Chloride: 103 mmol/L (ref 98–111)
Creatinine, Ser: 0.36 mg/dL — ABNORMAL LOW (ref 0.44–1.00)
GFR calc Af Amer: >60 mL/min (ref >60)
GFR calc non Af Amer: >60 mL/min (ref >60)
Glucose, Bld: 169 mg/dL — ABNORMAL HIGH (ref 70–99)
POTASSIUM: 3.9 mmol/L (ref 3.5–5.1)
Sodium: 136 mmol/L (ref 135–145)

## 2018-08-27 MED ORDER — CYCLOBENZAPRINE HCL 10 MG PO TABS
10.0000 mg | ORAL_TABLET | Freq: Three times a day (TID) | ORAL | Status: DC
  Administered 2018-08-27 – 2018-09-02 (×19): 10 mg via ORAL
  Filled 2018-08-27 (×20): qty 1

## 2018-08-27 MED ORDER — MAGIC MOUTHWASH W/LIDOCAINE
10.0000 mL | Freq: Three times a day (TID) | ORAL | Status: DC
  Administered 2018-08-27 – 2018-09-13 (×46): 10 mL via ORAL
  Filled 2018-08-27 (×57): qty 10

## 2018-08-27 MED ORDER — FLUCONAZOLE IN SODIUM CHLORIDE 200-0.9 MG/100ML-% IV SOLN
200.0000 mg | INTRAVENOUS | Status: DC
  Administered 2018-08-27 – 2018-08-30 (×4): 200 mg via INTRAVENOUS
  Filled 2018-08-27 (×4): qty 100

## 2018-08-27 NOTE — Progress Notes (Signed)
Nutrition Follow-up  INTERVENTION:   D/c Ensure supplements as pt is not drinking them  NUTRITION DIAGNOSIS:   Increased nutrient needs related to cancer and cancer related treatments as evidenced by estimated needs.  Ongoing.  GOAL:   Patient will meet greater than or equal to 90% of their needs  Progressing.  MONITOR:   PO intake, Supplement acceptance, Labs, Weight trends, I & O's  ASSESSMENT:   63 year old woman PMH metastatic breast cancer with extensive involvement of the cervical, thoracic and lumbar spine, diagnosed late December 2019, currently receiving chemotherapy, presented with worsening back pain, leg spasms, inability to ambulate.    Patient continues to eat but not as well given pt has now developed thrush. Receiving Magic Mouthwash. Pt has not been drinking Ensure supplements, will d/c.   Weight now 125 lb.   Labs reviewed. Medications: Magic Mouthwash w/ lidocaine, Zofran tablet PRN   Diet Order:   Diet Order            Diet regular Room service appropriate? Yes; Fluid consistency: Thin  Diet effective now              EDUCATION NEEDS:   No education needs have been identified at this time  Skin:  Skin Assessment: Reviewed RN Assessment  Last BM:  1/14  Height:   Ht Readings from Last 1 Encounters:  08/20/18 5\' 2"  (1.575 m)    Weight:   Wt Readings from Last 1 Encounters:  08/20/18 57.1 kg    Ideal Body Weight:  50 kg  BMI:  Body mass index is 23.02 kg/m.  Estimated Nutritional Needs:   Kcal:  1700-1900  Protein:  85-95g  Fluid:  1.9L/day  Clayton Bibles, MS, RD, LDN [place] [facility] Pager: [phone] After Hours Pager: [phone]

## 2018-08-27 NOTE — Progress Notes (Addendum)
PROGRESS NOTE  Bre Pecina IDP:[id number] DOB: February 08, 1956 DOA: 08/19/2018 PCP: Willeen Niece, PA  HPI/Recap of past 54 hours: 63 year old woman PMH metastatic breast cancer with extensive involvement of the cervical, thoracic and lumbar spine, diagnosed late December 2019, currently receiving chemotherapy, presented with worsening back pain, leg spasms, inability to ambulate.    Discharged 1/5 after being evaluated for new diagnosis of metastatic breast cancer, cancer related pain.  SNF was recommended but could not be pursued secondary to insurance.  Oral thrush noted on 08/25/2018.  Started nystatin mouthwash.  She states it is improving her symptoms.  08/26/2018: Patient seen and examined at her bedside.  No acute events overnight.  She states her pain is better controlled on current management however still experiences significant pain with minimal movement.  Plan for radiation therapy today.  08/27/2018: Patient seen and examined at her bedside.  Pain is limiting her mobility.  Ongoing adjustment of her pain medications. [place] [facility] on 08/27/18, Dr Zada Finders will be contacted by answering service about the patient. Discussed with Dr Zada Finders. Poor prognosis. Will continue current management as stated below. Palliative care also assisting.  Assessment/Plan: Active Problems:   Intractable back pain   Weakness of both lower extremities   Metastatic breast cancer (HCC)  Intractable back and lower extremity pain secondary to mets to bones in the setting of breast cancer Patient is currently receiving chemotherapy and ongoing radiation therapy Dr. Burr Medico is following Pain management and bowel regimen in place Fall precautions Extensive cervical, thoracic and lumbar spine involvement affecting her mobility Still requires high doses of pain medications  Oral thrush with suspicion for candidal esophagitis Magic mouthwash with lidocaine Start IV fluconazole 200 mg daily If  no improvement will consult GI  Constipation, resolved Received Dulcolax suppository Continue Senokot 2 tablets twice daily and MiraLAX daily  Ambulatory dysfunction PT recommends SNF CSW consulted to assist with SNF placement with palliative care services Fall precautions.  Difficult placement due to no insurance.  Pathological fracture in the setting of breast cancer with mets to bone affecting cervical, thoracic and lumbar spine. Fall precautions Ongoing radiation therapy Completed radiation therapy yesterday 08/26/18 Oncology following Increase Flexeril dose to 10 mg 3 times daily from 7.5 mg 3 times daily as needed     DVT prophylaxis:  Subcu Lovenox daily  Code Status: Full Family Communication:  None at bedside Disposition Plan: SNF when hemodynamically stable and oncology/radiation oncology sign off.    Objective: Vitals:   08/26/18 1336 08/26/18 2030 08/27/18 0449 08/27/18 1415  BP: 126/90 139/60 132/72 115/86  Pulse: (!) 114 84 92 97  Resp: 16 20 14 16   Temp: 98.1 F (36.7 C) 98.1 F (36.7 C) 98.4 F (36.9 C) 98.3 F (36.8 C)  TempSrc: Oral Oral Oral Oral  SpO2: 99% 94% 97% 100%  Weight:      Height:        Intake/Output Summary (Last 24 hours) at 08/27/2018 1556 Last data filed at 08/27/2018 1240 Gross per 24 hour  Intake 340 ml  Output 202 ml  Net 138 ml   Filed Weights   08/19/18 1848 08/20/18 0437  Weight: 54.4 kg 57.1 kg    Exam:  . General: 63 y.o. year-old female well-developed well-nourished.  Appears in distress due to diffuse bone pain.  Alert and oriented x3. . Cardiovascular: Regular rate and rhythm with no rubs or gallops.  No JVD or thyromegaly . Respiratory: Auscultation with no wheezes.  Poor inspiratory effort. . Abdomen:  Soft nontender nondistended with normal bowel sounds x4 quadrants. . Musculoskeletal: Trace lower extremity edema. 2/4 pulses in all 4 extremities. Marland Kitchen Psychiatry: Mood is appropriate for condition and  setting   Data Reviewed: CBC: Recent Labs  Lab 08/22/18 0716 08/23/18 0333 08/24/18 0626 08/27/18 0634  WBC 9.3 5.3 3.2* 2.3*  HGB 13.0 12.7 13.0 12.4  HCT 41.3 39.1 40.2 37.5  MCV 90.2 86.9 87.6 85.2  PLT 127* 112* 112* 81*   Basic Metabolic Panel: Recent Labs  Lab 08/22/18 0716 08/23/18 0333 08/24/18 0626 08/27/18 0634  NA 134* 135 137 136  K 4.5 4.0 3.8 3.9  CL 101 103 102 103  CO2 24 25 26 25   GLUCOSE 189* 231* 172* 169*  BUN 48* 34* 41* 32*  CREATININE 0.42* 0.38* 0.30* 0.36*  CALCIUM 8.2* 8.2* 8.4* 8.0*   GFR: Estimated Creatinine Clearance: 57.7 mL/min (A) (by C-G formula based on SCr of 0.36 mg/dL (L)). Liver Function Tests: No results for input(s): AST, ALT, ALKPHOS, BILITOT, PROT, ALBUMIN in the last 168 hours. No results for input(s): LIPASE, AMYLASE in the last 168 hours. No results for input(s): AMMONIA in the last 168 hours. Coagulation Profile: No results for input(s): INR, PROTIME in the last 168 hours. Cardiac Enzymes: No results for input(s): CKTOTAL, CKMB, CKMBINDEX, TROPONINI in the last 168 hours. BNP (last 3 results) No results for input(s): PROBNP in the last 8760 hours. HbA1C: No results for input(s): HGBA1C in the last 72 hours. CBG: No results for input(s): GLUCAP in the last 168 hours. Lipid Profile: No results for input(s): CHOL, HDL, LDLCALC, TRIG, CHOLHDL, LDLDIRECT in the last 72 hours. Thyroid Function Tests: No results for input(s): TSH, T4TOTAL, FREET4, T3FREE, THYROIDAB in the last 72 hours. Anemia Panel: No results for input(s): VITAMINB12, FOLATE, FERRITIN, TIBC, IRON, RETICCTPCT in the last 72 hours. Urine analysis:    Component Value Date/Time   COLORURINE YELLOW 08/21/2018 1045   APPEARANCEUR CLOUDY (A) 08/21/2018 1045   LABSPEC 1.025 08/21/2018 1045   PHURINE 6.0 08/21/2018 1045   GLUCOSEU >=500 (A) 08/21/2018 1045   HGBUR NEGATIVE 08/21/2018 [place] 08/21/2018 1045   KETONESUR 5 (A)  08/21/2018 1045   PROTEINUR NEGATIVE 08/21/2018 1045   NITRITE NEGATIVE 08/21/2018 1045   LEUKOCYTESUR NEGATIVE 08/21/2018 1045   Sepsis Labs: @LABRCNTIP (procalcitonin:4,lacticidven:4)  )No results found for this or any previous visit (from the past 240 hour(s)).    Studies: No results found.  Scheduled Meds: . bisacodyl  10 mg Rectal Once  . cyclobenzaprine  10 mg Oral TID  . dexamethasone  8 mg Oral Q8H  . DULoxetine  30 mg Oral Daily  . enoxaparin (LOVENOX) injection  40 mg Subcutaneous Q24H  . famotidine  20 mg Oral BID  . feeding supplement (ENSURE ENLIVE)  237 mL Oral BID BM  . gabapentin  100 mg Oral QHS  . lidocaine  1 patch Transdermal Q24H  . magic mouthwash w/lidocaine  10 mL Oral TID  . morphine  15 mg Oral Q12H  . polyethylene glycol  17 g Oral BID  . senna-docusate  2 tablet Oral BID    Continuous Infusions: . fluconazole (DIFLUCAN) IV 200 mg (08/27/18 1240)     LOS: 7 days     Kayleen Memos, MD [facility] Pager [phone]  If 7PM-7AM, please contact night-coverage [email or web address] Password [facility] 08/27/2018, 3:56 PM

## 2018-08-27 NOTE — Progress Notes (Addendum)
PT Cancellation Note  Patient Details Name: Kristina Mcfarland MRN: [id number] DOB: 11/16/1955   Cancelled Treatment:    Reason Eval/Treat Not Completed: Pain limiting ability to participate; patient reports extensive pain after being on bedpan for extended time this morning.  Reports MD witnessed the amount of pain for her just to roll in the bed and has planned to change her medications.  Will defer PT for now and check back another day when pain better controlled.  Spent time discussing how she moves in bed and how she manages her pain.  Encouraged her to continue asking staff to wait a moment and using breathing techniques to help when she can in addition to the heating pad that is already in her room.    Reginia Naas 08/27/2018, 11:25 AM  Magda Kiel, [place] [phone] 08/27/2018

## 2018-08-28 LAB — CBC
HCT: 35.5 % — ABNORMAL LOW (ref 36.0–46.0)
Hemoglobin: 11.4 g/dL — ABNORMAL LOW (ref 12.0–15.0)
MCH: 29 pg (ref 26.0–34.0)
MCHC: 32.1 g/dL (ref 30.0–36.0)
MCV: 90.3 fL (ref 80.0–100.0)
Platelets: 71 10*3/uL — ABNORMAL LOW (ref 150–400)
RBC: 3.93 MIL/uL (ref 3.87–5.11)
RDW: 17 % — ABNORMAL HIGH (ref 11.5–15.5)
WBC: 2.4 10*3/uL — ABNORMAL LOW (ref 4.0–10.5)
nRBC: 0.8 % — ABNORMAL HIGH (ref 0.0–0.2)

## 2018-08-28 LAB — BASIC METABOLIC PANEL
Anion gap: 9 (ref 5–15)
BUN: 32 mg/dL — ABNORMAL HIGH (ref 8–23)
CO2: 22 mmol/L (ref 22–32)
Calcium: 8 mg/dL — ABNORMAL LOW (ref 8.9–10.3)
Chloride: 105 mmol/L (ref 98–111)
Creatinine, Ser: <0.3 mg/dL — ABNORMAL LOW (ref 0.44–1.00)
Glucose, Bld: 256 mg/dL — ABNORMAL HIGH (ref 70–99)
POTASSIUM: 4.3 mmol/L (ref 3.5–5.1)
Sodium: 136 mmol/L (ref 135–145)

## 2018-08-28 MED ORDER — MORPHINE SULFATE ER 15 MG PO TBCR
15.0000 mg | EXTENDED_RELEASE_TABLET | Freq: Three times a day (TID) | ORAL | Status: DC
  Administered 2018-08-28 – 2018-09-10 (×35): 15 mg via ORAL
  Filled 2018-08-28 (×39): qty 1

## 2018-08-28 MED ORDER — LETROZOLE 2.5 MG PO TABS
2.5000 mg | ORAL_TABLET | Freq: Every day | ORAL | Status: DC
  Administered 2018-08-29 – 2018-09-10 (×12): 2.5 mg via ORAL
  Filled 2018-08-28 (×14): qty 1

## 2018-08-28 NOTE — Progress Notes (Signed)
Kristina Mcfarland   KGY:[birth year]   HF#:[id number]   FOY#:[id number]  Oncology follow up note   Subjective: Pt still has muscle spasm, and intermittent worsening pain, but overall better controlled.  She is still not able to walk independently, she starts shaking when she stands up.  She completed palliative radiation yesterday, tolerated well overall. ROS otherwise negative.   Objective:  Vitals:   08/28/18 0548 08/28/18 1525  BP: 103/77 117/85  Pulse: 90 (!) 101  Resp: 16 16  Temp: (!) 97.4 F (36.3 C) 98.1 F (36.7 C)  SpO2: 99% 100%    Body mass index is 23.02 kg/m.  Intake/Output Summary (Last 24 hours) at 08/28/2018 1657 Last data filed at 08/28/2018 1527 Gross per 24 hour  Intake 120 ml  Output -  Net 120 ml     Sclerae unicteric  Oropharynx clear  No peripheral adenopathy  Lungs clear -- no rales or rhonchi  Heart regular rate and rhythm  Abdomen benign  Neuro nonfocal   CBG (last 3)  No results for input(s): GLUCAP in the last 72 hours.   Labs:  Lab Results  Component Value Date   WBC 2.4 (L) 08/28/2018   HGB 11.4 (L) 08/28/2018   HCT 35.5 (L) 08/28/2018   MCV 90.3 08/28/2018   PLT 71 (L) 08/28/2018   NEUTROABS 12.5 (H) 08/19/2018   CMP Latest Ref Rng & Units 08/28/2018 08/27/2018 08/24/2018  Glucose 70 - 99 mg/dL 256(H) 169(H) 172(H)  BUN 8 - 23 mg/dL 32(H) 32(H) 41(H)  Creatinine 0.44 - 1.00 mg/dL <0.30(L) 0.36(L) 0.30(L)  Sodium 135 - 145 mmol/L 136 136 137  Potassium 3.5 - 5.1 mmol/L 4.3 3.9 3.8  Chloride 98 - 111 mmol/L 105 103 102  CO2 22 - 32 mmol/L _0 Calcium 8.9 - 10.3 mg/dL 8.0(L) 8.0(L) 8.4(L)  Total Protein 6.5 - 8.1 g/dL - - -  Total Bilirubin 0.3 - 1.2 mg/dL - - -  Alkaline Phos 38 - 126 U/L - - -  AST 15 - 41 U/L - - -  ALT 0 - 44 U/L - - -     Urine Studies No results for input(s): UHGB, CRYS in the last 72 hours.  Invalid input(s): UACOL, UAPR, USPG, UPH, UTP, UGL, [place], UBIL, UNIT, UROB, [place], UEPI, UWBC, Duwayne Heck  [place], [place]  Basic Metabolic Panel: Recent Labs  Lab 08/22/18 [phone] 08/23/18 0333 08/24/18 0626 08/27/18 0634 08/28/18 0357  NA 134* 135 137 136 136  K 4.5 4.0 3.8 3.9 4.3  CL 101 103 102 103 105  CO2 _1 GLUCOSE 189* 231* 172* 169* 256*  BUN 48* 34* 41* 32* 32*  CREATININE 0.42* 0.38* 0.30* 0.36* <0.30*  CALCIUM 8.2* 8.2* 8.4* 8.0* 8.0*   GFR CrCl cannot be calculated (This lab value cannot be used to calculate CrCl because it is not a number: <0.30). Liver Function Tests: No results for input(s): AST, ALT, ALKPHOS, BILITOT, PROT, ALBUMIN in the last 168 hours. No results for input(s): LIPASE, AMYLASE in the last 168 hours. No results for input(s): AMMONIA in the last 168 hours. Coagulation profile No results for input(s): INR, PROTIME in the last 168 hours.  CBC: Recent Labs  Lab 08/22/18 0716 08/23/18 0333 08/24/18 0626 08/27/18 0634 08/28/18 0357  WBC 9.3 5.3 3.2* 2.3* 2.4*  HGB 13.0 12.7 13.0 12.4 11.4*  HCT 41.3 39.1 40.2 37.5 35.5*  MCV 90.2 86.9 87.6 85.2 90.3  PLT 127* 112* 112*  81* 71*   Cardiac Enzymes: No results for input(s): CKTOTAL, CKMB, CKMBINDEX, TROPONINI in the last 168 hours. BNP: Invalid input(s): POCBNP CBG: No results for input(s): GLUCAP in the last 168 hours. D-Dimer No results for input(s): DDIMER in the last 72 hours. Hgb A1c No results for input(s): HGBA1C in the last 72 hours. Lipid Profile No results for input(s): CHOL, HDL, LDLCALC, TRIG, CHOLHDL, LDLDIRECT in the last 72 hours. Thyroid function studies No results for input(s): TSH, T4TOTAL, T3FREE, THYROIDAB in the last 72 hours.  Invalid input(s): FREET3 Anemia work up No results for input(s): VITAMINB12, FOLATE, FERRITIN, TIBC, IRON, RETICCTPCT in the last 72 hours. Microbiology No results found for this or any previous visit (from the past 240 hour(s)).    Studies:  No results found.  Assessment: 63 y.o. postmenopausal Caucasian female, with past  medical history of SVT, palpable left breast mass 4 months ago, presented with severe low back and bilateral hip pain  1.Diffuse metastatic breast cancer to bones, ER+/PR-/HER2-  2.Severe back pain and low extremity weakness, secondary to #1 3. Pancytopenia, secondary to diffuse bone mets and radiation, rule out DIC  4. Worsening hyperglycemia, secondary to steroids  5. History of SVT 6. Deconditioning     Plan:  -Her tumor ER/PR/HER2 results finally came back today, I reviewed with patient and gave her a copy of the report.   -We discussed treatment option for her metastatic breast cancer.  Due to the strong ER positive, HER-2 negative disease, bone metastasis only, poor performance status, I do not recommend chemotherapy.  -I recommend her to start aromatase inhibitor letrozole tomorrow, I will order it.  We reviewed the potential side effects, especially hot flash, mood swing, metabolic change, arthralgia, and osteoporosis, etc. she agrees to proceed -I also recommend CDK4/6 inhibitor Ibrance.  I will send prescription to specialty pharmacy, since she does not have insurance yet, she will need financial assistance to cover the medication.  Plan to start in a few weeks when she receives the medication.  Due to her pancytopenia, I will start at a low dose 85m daily for the first cycle  -I think her newly developed leukopenia and thrombocytopenia are related to her recent radiation, and all metastatic bone disease.  I will get a DIC panel to rule out DIC. -continue pain management and PT -Her disposition is pending, due to her insurance issue  -I will see her back next Monday if she is still in house.   YTruitt Merle MD 08/28/2018  4:57 PM

## 2018-08-28 NOTE — Progress Notes (Addendum)
PROGRESS NOTE    Kristina Mcfarland  FWY:[id number] DOB: 1956/03/22 DOA: 08/19/2018 PCP: Willeen Niece, PA   Brief Narrative:  63 year old woman PMH relevant for SVT and metastatic breast cancer with extensive involvement of the cervical, thoracic and lumbar spine, diagnosed late December 2019, currently receiving chemotherapy, presented with worsening back pain, leg spasms, inability to ambulate.    Assessment & Plan:   Active Problems:   Intractable back pain   Weakness of both lower extremities   Metastatic breast cancer ([place])   #) Widely metastatic breast cancer/back pain: Patient mainly has extensive involvement of the cervical, thoracic, lumbar spine on MRI with no evidence of cord compression or embarrassment at this time.  She does have multiple pathologic compression fractures.  These however appear to be stable from prior MRI approximately 1 month ago.  It appears primarily inability to ambulate secondary to back pain.  Complicating the fact is that she cannot afford a skilled nursing facility due to insurance problems. -Increase MS Contin to every 8 hours -Continue gabapentin 100 mg nightly - Continue dexamethasone 8 mg every 8 hours -Continue duloxetine 30 mg daily -Continue cyclobenzaprine 10 mg 3 times daily -continue PRN oxycodone -Palliative care following, appreciate recommendations -Oncology following  #) Thrombocytopenia/leukopenia: Possibly secondary to bone marrow infiltration  #) History of SVT: This is not an active issue at this time  Fluids: Tolerating p.o. Electrolytes: Monitor and supplement Nutrition: Regular diet   Prophylaxis:Thrombocytopenia  Disposition: Unclear at this time  Full code   Consultants:   Oncology  Palliative care  Procedures:   none  Antimicrobials:   none    Subjective: This morning patient reports that she has some pain but not much.  She has not tried to get out of bed or ambulate much due to pain when she tries  but she has not been asking for pain medication.  She denies any cough, congestion, rhinorrhea, nausea, vomiting, diarrhea.  Objective: Vitals:   08/27/18 1415 08/27/18 1728 08/27/18 2110 08/28/18 0548  BP: 115/86 (!) 115/101 116/83 103/77  Pulse: 97 (!) 106 81 90  Resp: 16 20 16 16   Temp: 98.3 F (36.8 C) 98.2 F (36.8 C) 97.8 F (36.6 C) (!) 97.4 F (36.3 C)  TempSrc: Oral Oral Oral Oral  SpO2: 100% 98% 99% 99%  Weight:      Height:        Intake/Output Summary (Last 24 hours) at 08/28/2018 1341 Last data filed at 08/27/2018 1612 Gross per 24 hour  Intake 220 ml  Output -  Net 220 ml   Filed Weights   08/19/18 1848 08/20/18 0437  Weight: 54.4 kg 57.1 kg    Examination:  General exam: Appears calm and comfortable  Respiratory system: Clear to auscultation. Respiratory effort normal. Cardiovascular system: S1 & S2 heard, RRR. No JVD, murmurs, rubs, gallops or clicks. No pedal edema. Gastrointestinal system: Abdomen is nondistended, soft and nontender. No organomegaly or masses felt. Normal bowel sounds heard. Central nervous system: Alert and oriented. No focal neurological deficits. Extremities: Symmetric 5 x 5 power. Skin: No rashes, lesions or ulcers Psychiatry: Judgement and insight appear normal. Mood & affect appropriate.     Data Reviewed: I have personally reviewed following labs and imaging studies  CBC: Recent Labs  Lab 08/22/18 0716 08/23/18 0333 08/24/18 0626 08/27/18 0634 08/28/18 0357  WBC 9.3 5.3 3.2* 2.3* 2.4*  HGB 13.0 12.7 13.0 12.4 11.4*  HCT 41.3 39.1 40.2 37.5 35.5*  MCV 90.2 86.9 87.6 85.2 90.3  PLT 127* 112* 112* 81* 71*   Basic Metabolic Panel: Recent Labs  Lab 08/22/18 0716 08/23/18 0333 08/24/18 0626 08/27/18 0634 08/28/18 0357  NA 134* 135 137 136 136  K 4.5 4.0 3.8 3.9 4.3  CL 101 103 102 103 105  CO2 24 25 26 25 22   GLUCOSE 189* 231* 172* 169* 256*  BUN 48* 34* 41* 32* 32*  CREATININE 0.42* 0.38* 0.30* 0.36* <0.30*    CALCIUM 8.2* 8.2* 8.4* 8.0* 8.0*   GFR: CrCl cannot be calculated (This lab value cannot be used to calculate CrCl because it is not a number: <0.30). Liver Function Tests: No results for input(s): AST, ALT, ALKPHOS, BILITOT, PROT, ALBUMIN in the last 168 hours. No results for input(s): LIPASE, AMYLASE in the last 168 hours. No results for input(s): AMMONIA in the last 168 hours. Coagulation Profile: No results for input(s): INR, PROTIME in the last 168 hours. Cardiac Enzymes: No results for input(s): CKTOTAL, CKMB, CKMBINDEX, TROPONINI in the last 168 hours. BNP (last 3 results) No results for input(s): PROBNP in the last 8760 hours. HbA1C: No results for input(s): HGBA1C in the last 72 hours. CBG: No results for input(s): GLUCAP in the last 168 hours. Lipid Profile: No results for input(s): CHOL, HDL, LDLCALC, TRIG, CHOLHDL, LDLDIRECT in the last 72 hours. Thyroid Function Tests: No results for input(s): TSH, T4TOTAL, FREET4, T3FREE, THYROIDAB in the last 72 hours. Anemia Panel: No results for input(s): VITAMINB12, FOLATE, FERRITIN, TIBC, IRON, RETICCTPCT in the last 72 hours. Sepsis Labs: No results for input(s): PROCALCITON, LATICACIDVEN in the last 168 hours.  No results found for this or any previous visit (from the past 240 hour(s)).       Radiology Studies: No results found.      Scheduled Meds: . bisacodyl  10 mg Rectal Once  . cyclobenzaprine  10 mg Oral TID  . dexamethasone  8 mg Oral Q8H  . DULoxetine  30 mg Oral Daily  . famotidine  20 mg Oral BID  . feeding supplement (ENSURE ENLIVE)  237 mL Oral BID BM  . gabapentin  100 mg Oral QHS  . lidocaine  1 patch Transdermal Q24H  . magic mouthwash w/lidocaine  10 mL Oral TID  . morphine  15 mg Oral Q8H  . polyethylene glycol  17 g Oral BID  . senna-docusate  2 tablet Oral BID   Continuous Infusions: . fluconazole (DIFLUCAN) IV Stopped (08/27/18 1350)     LOS: 8 days    Time spent: [place], MD [facility]  If 7PM-7AM, please contact night-coverage [email or web address] Password [email or web address] 08/28/2018, 1:41 PM

## 2018-08-28 NOTE — Progress Notes (Signed)
   08/28/18 1200  Clinical Encounter Type  Visited With Patient  Visit Type Follow-up;Psychological support;Spiritual support  Referral From Nurse  Consult/Referral To Chaplain  Spiritual Encounters  Spiritual Needs Literature;Emotional;Other (Comment) (Spiritual care Conversation/Support)  Stress Factors  Patient Stress Factors Family relationships;Health changes;Major life changes  Advance Directives (For Healthcare)  Does Patient Have a Medical Advance Directive? No  Would patient like information on creating a medical advance directive? Yes (Inpatient - patient requests chaplain consult to create a medical advance directive) (Paperwork provided)   I visited with Kristina Mcfarland per spiritual care consult to discuss an Advance Directive. I provided her the paperwork and education. Kristina Mcfarland states that she does not want her husband to make her healthcare decisions for her and that she isn't sure if her children could make her healthcare decisions if she is unable to. At this time, her mother is suffering with breast cancer. She remains hopeful about her condition and wants to "fight." Kristina Mcfarland wants to look over the Advance Directive paperwork and for me to follow-up at a later time.   Please, contact Spiritual Care for further assistance.   Chaplain Shanon Ace M.Div., [facility]

## 2018-08-28 NOTE — Progress Notes (Signed)
Physical Therapy Treatment Patient Details Name: Kristina Mcfarland MRN: [id number] DOB: 1956/02/16 Today's Date: 08/28/2018    History of Present Illness Pt is a 63 yo female with diagnosis of breast cancer stage IV admitted to ED on 1/6 for pain. Pt recently admitted on 12/26 for LBP with R leg radiations x 1 month. Pt also with L arm numbness. Imaging revealed cervical and thoracic spine pathologic fractures, and lesions to spine. Other notable PMH includes SVT, back surgery.      PT Comments    Pt stated "I figured I would take it easy yesterday and today." She reports some improvement with pain control. She was agreeable to perform some LE bed exercises. LE strength is no greater than 2/5 throughout. She rated pain 7/10. Noted a fairly large lump on lateral R hip area that is tender to palpation. Will continue to follow and progress activity as tolerated. Had discussion with RN about communicating time of next session so RN can pre-medicate the pt to see if she can tolerate bed mobility/sitting EOB.     Follow Up Recommendations  SNF     Equipment Recommendations  (TBD at next venue)    Recommendations for Other Services       Precautions / Restrictions Precautions Precautions: Fall;Back Precaution Comments: Mets and pathologic fractues to C- and T-spine Restrictions Weight Bearing Restrictions: No    Mobility  Bed Mobility               General bed mobility comments: Deferred  Transfers                    Ambulation/Gait                 Stairs             Wheelchair Mobility    Modified Rankin (Stroke Patients Only)       Balance                                            Cognition Arousal/Alertness: Awake/alert Behavior During Therapy: WFL for tasks assessed/performed Overall Cognitive Status: Within Functional Limits for tasks assessed                                        Exercises General  Exercises - Lower Extremity Ankle Circles/Pumps: AROM;Both;10 reps;Supine Quad Sets: AROM;Both;10 reps;Supine(weaker contraction on L) Heel Slides: AAROM;Both;5 reps;Supine    General Comments        Pertinent Vitals/Pain Pain Assessment: 0-10 Pain Score: 7  Pain Location: bilateral hips,back  Pain Descriptors / Indicators: Grimacing;Guarding;Jabbing Pain Intervention(s): Limited activity within patient's tolerance    Home Living                      Prior Function            PT Goals (current goals can now be found in the care plan section) Progress towards PT goals: Progressing toward goals    Frequency    Min 2X/week      PT Plan Current plan remains appropriate    Co-evaluation              AM-PAC PT "6 Clicks" Mobility   Outcome Measure  Help needed turning from your  back to your side while in a flat bed without using bedrails?: Total Help needed moving from lying on your back to sitting on the side of a flat bed without using bedrails?: Total Help needed moving to and from a bed to a chair (including a wheelchair)?: Total Help needed standing up from a chair using your arms (e.g., wheelchair or bedside chair)?: Total Help needed to walk in hospital room?: Total Help needed climbing 3-5 steps with a railing? : Total 6 Click Score: 6    End of Session   Activity Tolerance: Patient limited by pain Patient left: in bed;with call bell/phone within reach;with bed alarm set;with family/visitor present   PT Visit Diagnosis: Other abnormalities of gait and mobility (R26.89);Muscle weakness (generalized) (M62.81);Pain Pain - part of body: (R hip, back)     Time: 1224-8250 PT Time Calculation (min) (ACUTE ONLY): 20 min  Charges:  $Therapeutic Exercise: 8-22 mins                        Weston Anna, PT Acute Rehabilitation Services Pager: [phone] Office: [phone]

## 2018-08-28 NOTE — Progress Notes (Signed)
Daily Progress Note   Patient Name: Kristina Mcfarland       Date: 08/28/2018 DOB: 1955/12/09  Age: 63 y.o. MRN#: [id number] Attending Physician: Cristy Folks, MD Primary Care Physician: Willeen Niece, [place] Admit Date: 08/19/2018  Reason for Consultation/Follow-up: Establishing goals of care, Non pain symptom management and Pain control  Subjective: Patient is awake alert resting in bed.  She had a bowel movement yesterday Reports still in bed because of pain, but she has been declining breakthrough medication for her pain. See below  Length of Stay: 8  Current Medications: Scheduled Meds:  . bisacodyl  10 mg Rectal Once  . cyclobenzaprine  10 mg Oral TID  . dexamethasone  8 mg Oral Q8H  . DULoxetine  30 mg Oral Daily  . famotidine  20 mg Oral BID  . feeding supplement (ENSURE ENLIVE)  237 mL Oral BID BM  . gabapentin  100 mg Oral QHS  . [START ON 08/29/2018] letrozole  2.5 mg Oral Daily  . lidocaine  1 patch Transdermal Q24H  . magic mouthwash w/lidocaine  10 mL Oral TID  . morphine  15 mg Oral Q8H  . polyethylene glycol  17 g Oral BID  . senna-docusate  2 tablet Oral BID    Continuous Infusions: . fluconazole (DIFLUCAN) IV 200 mg (08/28/18 1346)    PRN Meds: acetaminophen **OR** acetaminophen, bisacodyl, HYDROmorphone (DILAUDID) injection, ondansetron **OR** ondansetron (ZOFRAN) IV, oxyCODONE, sodium phosphate  Physical Exam         Awake alert Resting in bed Regular S 1 S 2  Abdomen is soft No edema  Vital Signs: BP 122/88 (BP Location: Left Arm)   Pulse (!) 103   Temp 98.5 F (36.9 C) (Oral)   Resp 20   Ht 5\' 2"  (1.575 m)   Wt 57.1 kg   SpO2 98%   BMI 23.02 kg/m  SpO2: SpO2: 98 % O2 Device: O2 Device: Room Air O2 Flow Rate:    Intake/output summary:    Intake/Output Summary (Last 24 hours) at 08/28/2018 2241 Last data filed at 08/28/2018 1530 Gross per 24 hour  Intake 120 ml  Output 800 ml  Net -680 ml   LBM: Last BM Date: 08/27/18 Baseline Weight: Weight: 54.4 kg Most recent weight: Weight: 57.1 kg PPS 40%      Palliative  Assessment/Data:    Flowsheet Rows     Most Recent Value  Intake Tab  Referral Department  Hospitalist  Unit at Time of Referral  Oncology Unit  Palliative Care Primary Diagnosis  Cancer  Date Notified  08/20/18  Palliative Care Type  Return patient Palliative Care  Reason for referral  Non-pain Symptom, Clarify Goals of Care  Date of Admission  08/20/18  Date first seen by Palliative Care  08/21/18  # of days Palliative referral response time  1 Day(s)  # of days IP prior to Palliative referral  0  Clinical Assessment  Psychosocial & Spiritual Assessment  Palliative Care Outcomes      Patient Active Problem List   Diagnosis Date Noted  . Metastatic breast cancer ([place])   . Intractable back pain 08/20/2018  . Weakness of both lower extremities 08/20/2018  . Palliative care by specialist   . Goals of care, counseling/discussion   . Cancer associated pain   . Constipation   . Bone metastasis ([place]) 08/08/2018  . Paroxysmal SVT (supraventricular tachycardia) ([place]) 08/08/2018  . LFT elevation 08/08/2018  . Back pain 08/08/2018    Palliative Care Assessment & Plan   Patient Profile:    Assessment:  63 y.o. postmenopausal Caucasian female, with past medical history of SVT, palpable left breast mass 4 months ago, presented with severe low back and bilateral hip pain  1.Diffuse metastatic breast cancer to bones 2.Severe back pain and low extremity weakness, secondary to #1 3. History of SVT 4. Deconditioning      Recommendations/Plan:  Discussed with patient today, regarding her pain regimen.  Encouraged her to use breakthrough medication as needed.  Agree with increase in long  acting agent.   SNF rehab with palliative care following is recommended on discharge.  Chaplain consulted to complete advanced directives HCPOA, patient elects her son [name] to be her HCPOA agent.   Continue current mode of care for now.   Goals of Care and Additional Recommendations:  Limitations on Scope of Treatment: Full Scope Treatment  Code Status:    Code Status Orders  (From admission, onward)         Start     Ordered   08/20/18 0148  Full code  Continuous     08/20/18 0147        Code Status History    Date Active Date Inactive Code Status Order ID Comments User Context   08/08/2018 1307 08/18/2018 1728 Full Code [id number]  Reubin Milan, MD Inpatient       Prognosis:   Unable to determine  Discharge Planning:  [place] for rehab with Palliative care service follow-up  Care plan was discussed with  Patient and son   Thank you for allowing the Palliative Medicine Team to assist in the care of this patient.   Total Time 20 Prolonged Time Billed  no       Greater than 50%  of this time was spent counseling and coordinating care related to the above assessment and plan.  Micheline Rough, MD [place] Team [phone]  Please contact Palliative Medicine Team phone at [phone] for questions and concerns.

## 2018-08-28 NOTE — Progress Notes (Signed)
Inpatient Diabetes Program Recommendations  AACE/ADA: New Consensus Statement on Inpatient Glycemic Control (2015)  Target Ranges:  Prepandial:   less than 140 mg/dL      Peak postprandial:   less than 180 mg/dL (1-2 hours)      Critically ill patients:  140 - 180 mg/dL   Results for EARNIE, ROCKHOLD (MRN [id number]) as of 08/28/2018 09:14  Ref. Range 08/23/2018 03:33 08/24/2018 06:26 08/27/2018 06:34 08/28/2018 03:57  Glucose Latest Ref Range: 70 - 99 mg/dL 231 (H) 172 (H) 169 (H) 256 (H)    Admit with: Intractable back and lower extremity pain secondary to mets to bones in the setting of breast cancer  No History of DM noted      MD- Note patient getting Decadron 8 mg Q8 hours.  Lab glucose elevated this AM.  If within goals of care for this patient, please consider placing orders for Novolog Sensitive Correction Scale/ SSI (0-9 units) TID AC + HS while patient getting Decadron.     --Will follow patient during hospitalization--  Wyn Quaker RN, MSN, CDE Diabetes Coordinator Inpatient Glycemic Control Team Team Pager: [phone] (8a-5p)

## 2018-08-29 ENCOUNTER — Telehealth: Payer: Self-pay | Admitting: Pharmacist

## 2018-08-29 DIAGNOSIS — C50919 Malignant neoplasm of unspecified site of unspecified female breast: Secondary | ICD-10-CM

## 2018-08-29 LAB — CBC
HCT: 34 % — ABNORMAL LOW (ref 36.0–46.0)
Hemoglobin: 10.8 g/dL — ABNORMAL LOW (ref 12.0–15.0)
MCH: 29.2 pg (ref 26.0–34.0)
MCHC: 31.8 g/dL (ref 30.0–36.0)
MCV: 91.9 fL (ref 80.0–100.0)
Platelets: 68 10*3/uL — ABNORMAL LOW (ref 150–400)
RBC: 3.7 MIL/uL — ABNORMAL LOW (ref 3.87–5.11)
RDW: 17.6 % — ABNORMAL HIGH (ref 11.5–15.5)
WBC: 2.9 K/uL — ABNORMAL LOW (ref 4.0–10.5)
nRBC: 1 % — ABNORMAL HIGH (ref 0.0–0.2)

## 2018-08-29 LAB — DIC (DISSEMINATED INTRAVASCULAR COAGULATION)PANEL
D-Dimer, Quant: 0.95 ug/mL-FEU — ABNORMAL HIGH (ref 0.00–0.50)
Fibrinogen: 347 mg/dL (ref 210–475)
Platelets: 68 10*3/uL — ABNORMAL LOW (ref 150–400)
Prothrombin Time: 13.4 seconds (ref 11.4–15.2)
Smear Review: NONE SEEN

## 2018-08-29 LAB — BASIC METABOLIC PANEL
Anion gap: 9 (ref 5–15)
BUN: 30 mg/dL — ABNORMAL HIGH (ref 8–23)
CO2: 24 mmol/L (ref 22–32)
Calcium: 7.8 mg/dL — ABNORMAL LOW (ref 8.9–10.3)
Chloride: 103 mmol/L (ref 98–111)
Creatinine, Ser: 0.35 mg/dL — ABNORMAL LOW (ref 0.44–1.00)
GFR calc non Af Amer: >60 mL/min (ref >60)
Glucose, Bld: 260 mg/dL — ABNORMAL HIGH (ref 70–99)
Potassium: 4.2 mmol/L (ref 3.5–5.1)
Sodium: 136 mmol/L (ref 135–145)

## 2018-08-29 LAB — DIC (DISSEMINATED INTRAVASCULAR COAGULATION) PANEL
APTT: 21 s — AB (ref 24–36)
INR: 1.03

## 2018-08-29 MED ORDER — PALBOCICLIB 75 MG PO CAPS
75.0000 mg | ORAL_CAPSULE | Freq: Every day | ORAL | Status: DC
  Administered 2018-08-30 – 2018-09-02 (×4): 75 mg via ORAL

## 2018-08-29 MED ORDER — DEXAMETHASONE 4 MG PO TABS
4.0000 mg | ORAL_TABLET | Freq: Three times a day (TID) | ORAL | Status: DC
  Administered 2018-08-29 – 2018-09-01 (×9): 4 mg via ORAL
  Filled 2018-08-29 (×9): qty 1

## 2018-08-29 MED ORDER — PALBOCICLIB 75 MG PO CAPS: 75.0000 mg | ORAL_CAPSULE | Freq: Every day | ORAL | 0 refills | Status: DC

## 2018-08-29 MED FILL — IBRANCE 75 MG CAPSULE: 75 | 21 days supply | Qty: 21 | Fill #0

## 2018-08-29 NOTE — Progress Notes (Signed)
PROGRESS NOTE    Kristina Mcfarland  EYC:[id number] DOB: 1955-10-15 DOA: 08/19/2018 PCP: Willeen Niece, PA   Brief Narrative:  63 year old woman PMH relevant for SVT and metastatic breast cancer with extensive involvement of the cervical, thoracic and lumbar spine, diagnosed late December 2019, currently receiving chemotherapy, presented with worsening back pain, leg spasms, inability to ambulate.    Assessment & Plan:   Active Problems:   Intractable back pain   Weakness of both lower extremities   Metastatic breast cancer ([name])   #) Widely metastatic breast cancer/back pain: Patient mainly has extensive involvement of the cervical, thoracic, lumbar spine on MRI with no evidence of cord compression or embarrassment at this time.  She does have multiple pathologic compression fractures.  These however appear to be stable from prior MRI approximately 1 month ago.  It appears primarily inability to ambulate secondary to back pain.  Complicating the fact is that she cannot afford a skilled nursing facility due to insurance problems. -Increase MS Contin to every 8 hours -Continue gabapentin 100 mg nightly - Continue dexamethasone 8 mg every 8 hours -Continue duloxetine 30 mg daily -Continue cyclobenzaprine 10 mg 3 times daily -continue PRN oxycodone -Palliative care following, appreciate recommendations -Oncology following  #) Thrombocytopenia/leukopenia: Possibly secondary to bone marrow infiltration  #) History of SVT: This is not an active issue at this time  Fluids: Tolerating p.o. Electrolytes: Monitor and supplement Nutrition: Regular diet   Prophylaxis:Thrombocytopenia  Disposition: Pending ability to ambulate, patient is self-pay and so cannot afford SNF  Full code   Consultants:   Oncology  Palliative care  Procedures:   none  Antimicrobials:   none    Subjective: This morning patient reports that she has much less pain.  She is willing to walk.  After  leaving the room however she apparently told the nurse and staff that she did not feel like walking.  Objective: Vitals:   08/28/18 0548 08/28/18 1525 08/28/18 2108 08/29/18 0445  BP: 103/77 117/85 122/88 111/75  Pulse: 90 (!) 101 (!) 103 86  Resp: 16 16 20 16   Temp: (!) 97.4 F (36.3 C) 98.1 F (36.7 C) 98.5 F (36.9 C) 98 F (36.7 C)  TempSrc: Oral Oral Oral Oral  SpO2: 99% 100% 98% 97%  Weight:      Height:        Intake/Output Summary (Last 24 hours) at 08/29/2018 1131 Last data filed at 08/29/2018 1124 Gross per 24 hour  Intake 840 ml  Output 800 ml  Net 40 ml   Filed Weights   08/19/18 1848 08/20/18 0437  Weight: 54.4 kg 57.1 kg    Examination:  General exam: Appears calm and comfortable  Respiratory system: Clear to auscultation. Respiratory effort normal. Cardiovascular system: S1 & S2 heard, RRR. No JVD, murmurs, rubs, gallops or clicks. No pedal edema. Gastrointestinal system: Abdomen is nondistended, soft and nontender. No organomegaly or masses felt. Normal bowel sounds heard. Central nervous system: Alert and oriented. No focal neurological deficits. Extremities: Symmetric 5 x 5 power. Skin: No rashes, lesions or ulcers Psychiatry: Judgement and insight appear normal. Mood & affect appropriate.     Data Reviewed: I have personally reviewed following labs and imaging studies  CBC: Recent Labs  Lab 08/23/18 0333 08/24/18 0626 08/27/18 0634 08/28/18 0357 08/29/18 0340  WBC 5.3 3.2* 2.3* 2.4* 2.9*  HGB 12.7 13.0 12.4 11.4* 10.8*  HCT 39.1 40.2 37.5 35.5* 34.0*  MCV 86.9 87.6 85.2 90.3 91.9  PLT 112* 112* 81*  71* 68*  68*   Basic Metabolic Panel: Recent Labs  Lab 08/23/18 0333 08/24/18 0626 08/27/18 0634 08/28/18 0357 08/29/18 0340  NA 135 137 136 136 136  K 4.0 3.8 3.9 4.3 4.2  CL 103 102 103 105 103  CO2 25 26 25 22 24   GLUCOSE 231* 172* 169* 256* 260*  BUN 34* 41* 32* 32* 30*  CREATININE 0.38* 0.30* 0.36* <0.30* 0.35*  CALCIUM 8.2*  8.4* 8.0* 8.0* 7.8*   GFR: Estimated Creatinine Clearance: 57.7 mL/min (A) (by C-G formula based on SCr of 0.35 mg/dL (L)). Liver Function Tests: No results for input(s): AST, ALT, ALKPHOS, BILITOT, PROT, ALBUMIN in the last 168 hours. No results for input(s): LIPASE, AMYLASE in the last 168 hours. No results for input(s): AMMONIA in the last 168 hours. Coagulation Profile: Recent Labs  Lab 08/29/18 0340  INR 1.03   Cardiac Enzymes: No results for input(s): CKTOTAL, CKMB, CKMBINDEX, TROPONINI in the last 168 hours. BNP (last 3 results) No results for input(s): PROBNP in the last 8760 hours. HbA1C: No results for input(s): HGBA1C in the last 72 hours. CBG: No results for input(s): GLUCAP in the last 168 hours. Lipid Profile: No results for input(s): CHOL, HDL, LDLCALC, TRIG, CHOLHDL, LDLDIRECT in the last 72 hours. Thyroid Function Tests: No results for input(s): TSH, T4TOTAL, FREET4, T3FREE, THYROIDAB in the last 72 hours. Anemia Panel: No results for input(s): VITAMINB12, FOLATE, FERRITIN, TIBC, IRON, RETICCTPCT in the last 72 hours. Sepsis Labs: No results for input(s): PROCALCITON, LATICACIDVEN in the last 168 hours.  No results found for this or any previous visit (from the past 240 hour(s)).       Radiology Studies: No results found.      Scheduled Meds: . bisacodyl  10 mg Rectal Once  . cyclobenzaprine  10 mg Oral TID  . dexamethasone  4 mg Oral Q8H  . DULoxetine  30 mg Oral Daily  . famotidine  20 mg Oral BID  . feeding supplement (ENSURE ENLIVE)  237 mL Oral BID BM  . gabapentin  100 mg Oral QHS  . letrozole  2.5 mg Oral Daily  . lidocaine  1 patch Transdermal Q24H  . magic mouthwash w/lidocaine  10 mL Oral TID  . morphine  15 mg Oral Q8H  . polyethylene glycol  17 g Oral BID  . senna-docusate  2 tablet Oral BID   Continuous Infusions: . fluconazole (DIFLUCAN) IV 200 mg (08/28/18 1346)     LOS: 9 days    Time spent: [place],  MD [facility]  If 7PM-7AM, please contact night-coverage [email or web address] Password [facility] 08/29/2018, 11:31 AM

## 2018-08-29 NOTE — Telephone Encounter (Signed)
Oral Oncology Pharmacist Encounter  Received new prescription for Ibrance (palbociclib) for the treatment of newly diagnosed, metastatic breast cancer, hormone-receptor positive, Her2 negative, in conjunction with Femara, planned duration until disease progression or unacceptable toxicity.  Labs from Epic assessed  Noted moderate-severe thrombocytopenia on CBC performed 08/28/18 with pltc=68k This is dramatically reduced since 2 weeks prior, MD thinks secondary to radiation.  Leslee Home will be initiated at decreased dose of 84m PO daily for 3 weeks on, 1 week off, and repeated until counts recover.  Current medication list in Epic reviewed, moderate DDI with Ibrance and fluconazole identified:  Category C interaction: Fluconazole is a moderate inhibitor of CYP3A4, ILeslee Homeis a major substrate of this enzyme.  Possible decreased metabolism and increased systemic exposure to ID[facility] Hlthcarewhen medications are used concurrently.  ILeslee Homehas already been dose reduced secondary to thrombocytopenia.  Fluconazole has been prescribed due to oral thrush, expect short course.  Okay to start Ibrance at this time, counts will be closely monitored while patient remains admitted.  [facility].  Oral Oncology Clinic will continue to follow for insurance authorization, copayment issues, initial counseling and start date.  JJohny Drilling PharmD, BCPS, BCOP  08/29/2018 8:41 AM Oral Oncology Clinic [phone]

## 2018-08-29 NOTE — Telephone Encounter (Signed)
Oral Oncology Pharmacist Encounter  Ibrance prescription picked up from the [facility] long outpatient pharmacy and delivered to the [facility] long hospital inpatient pharmacy by this pharmacist. Order for [facility] to be administered while patient is admitted, starting 08/30/2018, has also been placed by this pharmacist and sent to Dr. Burr Medico for [place].  Johny Drilling, PharmD, BCPS, BCOP  08/29/2018 3:46 PM Oral Oncology Clinic [phone]

## 2018-08-29 NOTE — Progress Notes (Signed)
Pt having hyperglycemic episodes due to Dexamethasone at 8 mg q 8 hours. We will drop to 4 mg q 8 hours, and would anticipate taper to 4 mg BID if she tolerates this over the next week. We will follow up with this plan with the patient.     Carola Rhine, PAC

## 2018-08-29 NOTE — Telephone Encounter (Signed)
Oral Chemotherapy Pharmacist Encounter   I spoke with patient in hospital room for overview of: Ibrance (palbociclib) for the treatment of newly diagnosed, metastatic breast cancer, hormone-receptor positive, Her2 negative, in conjunction with Femara, planned duration until disease progression or unacceptable toxicity.  Counseled patient on administration, dosing, side effects, monitoring, drug-food interactions, safe handling, storage, and disposal.  Patient will take Ibrance 46m capsules, 1 capsule by mouth once daily with breakfast for 3 weeks on, 1 week off. Patient informed her dose may be increased in the future if her counts recover.  Patient knows to avoid grapefruit and grapefruit juice.  Patient is taking Femara once daily with breakfast.  Ibrance start date: 08/30/2018  Adverse effects include but are not limited to: fatigue, hair loss, GI upset, nausea, decreased blood counts, and increased upper respiratory infections. Severe, life-threatening, and/or fatal interstitial lung disease (ILD) and/or pneumonitis may occur with CDK 4/6 inhibitors.  Patient will obtain anti diarrheal and alert the office of 4 or more loose stools above baseline.  Patient reminded of WBC check on Cycle 1 Day 14 for dose and ANC assessment.  Reviewed with patient importance of keeping a medication schedule and plan for any missed doses.  Mrs. Letendre voiced understanding and appreciation.   All questions answered. Medication reconciliation performed and medication/allergy list updated.  Patient currently without insurance. 1st fill of ILeslee Homewill be dispensed from the [facility] of manufacturer voucher. Oral oncology patient advocate will work with patient to sign her up for manufacturer assistance until [facility].  I will pick up patient's Ibrance from the pharmacy this afternoon and deliver it to the [facility]so that it  can be dispensed to patient while she is admitted. I will work with Dr. FBurr Medicoto have medication order placed for inpatient Ibrance administration.  Patient knows to call the office with questions or concerns. Oral Oncology Clinic will continue to follow.  JJohny Drilling PharmD, BCPS, BCOP  08/29/2018   11:45 AM Oral Oncology Clinic [phone]

## 2018-08-29 NOTE — Progress Notes (Signed)
  Radiation Oncology         [phone]) [phone] ________________________________  Name: Kristina Mcfarland MRN: [id number]  Date: 08/27/2018  DOB: July 01, 1956  End of Treatment Note  Diagnosis:   63 y.o. female with Probable Stage IV left breast cancer metastasized to nodes and bone     Indication for treatment:  Palliative       Radiation treatment dates:   08/12/2018 - 08/27/2018  Site/dose:   The thoracic spine (centered at T8) and lumbar spine (centered at L3) were treated to 30 Gy in 10 fractions of 3 Gy.  Beams/energy:   Isodose Plan / 15X Photon  Narrative: The patient tolerated radiation treatment relatively well.  She reports improvement of her back pain. Over the course of treatment she experienced mild fatigue, dry throat, softer stools, and some nausea. In light of the nausea she was given nausea medication prior to her radiation treatments.  Plan: The patient has completed radiation treatment. The patient will return to radiation oncology clinic for routine followup in one month. I advised them to call or return sooner if they have any questions or concerns related to their recovery or treatment.  ------------------------------------------------  Jodelle Gross, MD, PhD  This document serves as a record of services personally performed by Kyung Rudd, MD. It was created on his behalf by Rae Lips, a trained medical scribe. The creation of this record is based on the scribe's personal observations and the provider's statements to them. This document has been checked and approved by the attending provider.

## 2018-08-30 ENCOUNTER — Telehealth: Payer: Self-pay

## 2018-08-30 DIAGNOSIS — C50912 Malignant neoplasm of unspecified site of left female breast: Secondary | ICD-10-CM

## 2018-08-30 DIAGNOSIS — D61818 Other pancytopenia: Secondary | ICD-10-CM

## 2018-08-30 LAB — CBC
HCT: 37.7 % (ref 36.0–46.0)
Hemoglobin: 11.8 g/dL — ABNORMAL LOW (ref 12.0–15.0)
MCH: 28.2 pg (ref 26.0–34.0)
MCHC: 31.3 g/dL (ref 30.0–36.0)
MCV: 90.2 fL (ref 80.0–100.0)
Platelets: 61 10*3/uL — ABNORMAL LOW (ref 150–400)
RBC: 4.18 MIL/uL (ref 3.87–5.11)
RDW: 17.2 % — ABNORMAL HIGH (ref 11.5–15.5)
WBC: 3 10*3/uL — ABNORMAL LOW (ref 4.0–10.5)
nRBC: 1.7 % — ABNORMAL HIGH (ref 0.0–0.2)

## 2018-08-30 LAB — MAGNESIUM: Magnesium: 2.2 mg/dL (ref 1.7–2.4)

## 2018-08-30 LAB — BASIC METABOLIC PANEL
Anion gap: 8 (ref 5–15)
BUN: 27 mg/dL — ABNORMAL HIGH (ref 8–23)
CO2: 25 mmol/L (ref 22–32)
Chloride: 102 mmol/L (ref 98–111)
Creatinine, Ser: 0.33 mg/dL — ABNORMAL LOW (ref 0.44–1.00)
GFR calc Af Amer: >60 mL/min (ref >60)
GFR calc non Af Amer: >60 mL/min (ref >60)
Glucose, Bld: 243 mg/dL — ABNORMAL HIGH (ref 70–99)
Potassium: 4.7 mmol/L (ref 3.5–5.1)

## 2018-08-30 LAB — BASIC METABOLIC PANEL WITH GFR
Calcium: 8.1 mg/dL — ABNORMAL LOW (ref 8.9–10.3)
Sodium: 135 mmol/L (ref 135–145)

## 2018-08-30 MED ORDER — FLUCONAZOLE 100 MG PO TABS
200.0000 mg | ORAL_TABLET | Freq: Every day | ORAL | Status: DC
  Administered 2018-08-31 – 2018-09-04 (×5): 200 mg via ORAL
  Filled 2018-08-30 (×5): qty 2

## 2018-08-30 MED ORDER — BUPROPION HCL ER (SR) 150 MG PO TB12
150.0000 mg | ORAL_TABLET | Freq: Every day | ORAL | Status: DC
  Administered 2018-08-30 – 2018-09-10 (×10): 150 mg via ORAL
  Filled 2018-08-30 (×13): qty 1

## 2018-08-30 NOTE — Progress Notes (Signed)
PROGRESS NOTE    Kristina Mcfarland  UYQ:[id number] DOB: [birth year] DOA: 08/19/2018 PCP: Willeen Niece, PA   Brief Narrative:  63 year old woman PMH relevant for SVT and metastatic breast cancer with extensive involvement of the cervical, thoracic and lumbar spine, diagnosed late December 2019, currently receiving chemotherapy, presented with worsening back pain, leg spasms, inability to ambulate.    Assessment & Plan:   Active Problems:   Intractable back pain   Weakness of both lower extremities   Metastatic breast cancer ([place])   #) Widely metastatic breast cancer/back pain: Patient mainly has extensive involvement of the cervical, thoracic, lumbar spine on MRI with no evidence of cord compression or embarrassment at this time.  She does have multiple pathologic compression fractures.  These however appear to be stable from prior MRI approximately 1 month ago.  It appears primarily inability to ambulate secondary to back pain.  Complicating the fact is that she cannot afford a skilled nursing facility due to insurance problems. -Continue MS Contin to every 8 hours -Continue gabapentin 100 mg nightly - Continue dexamethasone 8 mg every 8 hours -Continue duloxetine 30 mg daily -Continue cyclobenzaprine 10 mg 3 times daily -continue PRN oxycodone -Palliative care following, appreciate recommendations -Oncology following  #) Thrombocytopenia/leukopenia: Possibly secondary to bone marrow infiltration  #) History of SVT: This is not an active issue at this time  Fluids: Tolerating p.o. Electrolytes: Monitor and supplement Nutrition: Regular diet   Prophylaxis:Thrombocytopenia  Disposition: Pending ability to ambulate, patient is self-pay and so cannot afford SNF  Full code   Consultants:   Oncology  Palliative care  Procedures:   none  Antimicrobials:   none    Subjective: This morning patient reports that she does not have pain.  When confronted with the fact that  she is not working with physical therapy and is not getting up she becomes quite upset and started crying.  Additionally her son becomes upset.  After extensive discussion with her and the son she cannot safely be discharged home with her son as he is the only caregiver there are multiple dependence at home.  She additionally does not appear to request pain medications before working with PT and so is been quite difficult for her to work with them.  Objective: Vitals:   08/29/18 0445 08/29/18 1219 08/29/18 2149 08/30/18 0505  BP: 111/75 120/90 116/82 121/84  Pulse: 86 97 88 81  Resp: 16 16 17 16   Temp: 98 F (36.7 C) 98.2 F (36.8 C) 98.4 F (36.9 C) 98.1 F (36.7 C)  TempSrc: Oral Oral Oral Oral  SpO2: 97% 100% 98% 99%  Weight:      Height:        Intake/Output Summary (Last 24 hours) at 08/30/2018 1053 Last data filed at 08/29/2018 1124 Gross per 24 hour  Intake 360 ml  Output -  Net 360 ml   Filed Weights   08/19/18 1848 08/20/18 0437  Weight: 54.4 kg 57.1 kg    Examination:  General exam: Appears calm and comfortable  Respiratory system: Clear to auscultation. Respiratory effort normal. Cardiovascular system: Regular rate and rhythm, no murmurs Gastrointestinal system: Abdomen is nondistended, soft and nontender. No organomegaly or masses felt. Normal bowel sounds heard. Central nervous system: Alert and oriented.  Lower extremity strength is 4 out of 5 Extremities: No lower extremity edema Skin: No rashes over visible skin Psychiatry: Judgement and insight appear normal. Mood & affect appropriate.     Data Reviewed: I have personally reviewed following  labs and imaging studies  CBC: Recent Labs  Lab 08/24/18 0626 08/27/18 0634 08/28/18 0357 08/29/18 0340 08/30/18 0331  WBC 3.2* 2.3* 2.4* 2.9* 3.0*  HGB 13.0 12.4 11.4* 10.8* 11.8*  HCT 40.2 37.5 35.5* 34.0* 37.7  MCV 87.6 85.2 90.3 91.9 90.2  PLT 112* 81* 71* 68*  68* 61*   Basic Metabolic Panel: Recent  Labs  Lab 08/24/18 0626 08/27/18 0634 08/28/18 0357 08/29/18 0340 08/30/18 0331  NA 137 136 136 136 135  K 3.8 3.9 4.3 4.2 4.7  CL 102 103 105 103 102  CO2 26 25 22 24 25   GLUCOSE 172* 169* 256* 260* 243*  BUN 41* 32* 32* 30* 27*  CREATININE 0.30* 0.36* <0.30* 0.35* 0.33*  CALCIUM 8.4* 8.0* 8.0* 7.8* 8.1*  MG  --   --   --   --  2.2   GFR: Estimated Creatinine Clearance: 57.7 mL/min (A) (by C-G formula based on SCr of 0.33 mg/dL (L)). Liver Function Tests: No results for input(s): AST, ALT, ALKPHOS, BILITOT, PROT, ALBUMIN in the last 168 hours. No results for input(s): LIPASE, AMYLASE in the last 168 hours. No results for input(s): AMMONIA in the last 168 hours. Coagulation Profile: Recent Labs  Lab 08/29/18 0340  INR 1.03   Cardiac Enzymes: No results for input(s): CKTOTAL, CKMB, CKMBINDEX, TROPONINI in the last 168 hours. BNP (last 3 results) No results for input(s): PROBNP in the last 8760 hours. HbA1C: No results for input(s): HGBA1C in the last 72 hours. CBG: No results for input(s): GLUCAP in the last 168 hours. Lipid Profile: No results for input(s): CHOL, HDL, LDLCALC, TRIG, CHOLHDL, LDLDIRECT in the last 72 hours. Thyroid Function Tests: No results for input(s): TSH, T4TOTAL, FREET4, T3FREE, THYROIDAB in the last 72 hours. Anemia Panel: No results for input(s): VITAMINB12, FOLATE, FERRITIN, TIBC, IRON, RETICCTPCT in the last 72 hours. Sepsis Labs: No results for input(s): PROCALCITON, LATICACIDVEN in the last 168 hours.  No results found for this or any previous visit (from the past 240 hour(s)).       Radiology Studies: No results found.      Scheduled Meds: . bisacodyl  10 mg Rectal Once  . buPROPion  150 mg Oral Daily  . cyclobenzaprine  10 mg Oral TID  . dexamethasone  4 mg Oral Q8H  . DULoxetine  30 mg Oral Daily  . famotidine  20 mg Oral BID  . feeding supplement (ENSURE ENLIVE)  237 mL Oral BID BM  . gabapentin  100 mg Oral QHS  .  letrozole  2.5 mg Oral Daily  . lidocaine  1 patch Transdermal Q24H  . magic mouthwash w/lidocaine  10 mL Oral TID  . morphine  15 mg Oral Q8H  . palbociclib  75 mg Oral Q breakfast  . polyethylene glycol  17 g Oral BID  . senna-docusate  2 tablet Oral BID   Continuous Infusions: . fluconazole (DIFLUCAN) IV 200 mg (08/29/18 1153)     LOS: 10 days    Time spent: [place], MD [facility]  If 7PM-7AM, please contact night-coverage [email or web address] Password [facility] 08/30/2018, 10:53 AM

## 2018-08-30 NOTE — Progress Notes (Signed)
Pharmacy IV to PO conversion  This patient is receiving Fluconazole by the intravenous route. Based on criteria approved by the Pharmacy and Therapeutics Committee, and the Infectious Disease Division, the antibiotic(s) is/are being converted to equivalent oral dose form(s). These criteria include:   Patient being treated for a respiratory tract infection, urinary tract infection, cellulitis, or Clostridium Difficile Associated Diarrhea  The patient is not neutropenic and does not exhibit a GI malabsorption state  The patient is eating (either orally or per tube) and/or has been taking other orally administered medications for at least 24 hours.  The patient is improving clinically (physician assessment and a 24-hour Tmax of <=100.5 F)  If you have any questions about this conversion, please contact the Pharmacy Department (ext [phone]).  Thank you.  Reuel Boom, PharmD, BCPS [phone] 08/30/2018, 3:26 PM

## 2018-08-30 NOTE — Telephone Encounter (Signed)
Oral Oncology Patient Advocate Encounter  The patient has no insurance at this time. Kristina Mcfarland was filled 08/29/18 with a voucher. I have filled out a Materials engineer and spoke to the patient about getting income documents. The patient stated she would get her son to bring me the income documents next time he came to visit her in the hospital. I went to see the patient in her room, she signed the application and I left her my card to call me when her son was here with income verification.  This encounter will continue to be updated until final determination.  [place] Patient [place] Phone [phone] Fax [phone]

## 2018-08-30 NOTE — Progress Notes (Signed)
Physical Therapy Treatment Patient Details Name: Kristina Mcfarland MRN: [id number] DOB: 12/03/1955 Today's Date: 08/30/2018    History of Present Illness  63 yo female with diagnosis of breast cancer stage IV admitted to ED on 1/6 for pain. Pt recently admitted on 12/26 for LBP with R leg radiations x 1 month. Pt also with L arm numbness. Imaging revealed cervical and thoracic spine pathologic fractures, and lesions to spine. Other notable PMH includes SVT, back surgery.      PT Comments    Pt agreeable to work with PT. Focus of session was on bed mobility and sitting balance. Pt is very weak and at high risk for falls. Poor trunk control and strength. Attempted to work on anterior reaching. Sitting is painful in back and bilateral hips. Recommend SNF.    Follow Up Recommendations  SNF; 24 hour supervision/assist     Equipment Recommendations  Hospital bed;Wheelchair (measurements PT);Wheelchair cushion (measurements PT);3in1 (PT)    Recommendations for Other Services       Precautions / Restrictions Precautions Precautions: Fall Precaution Comments: Mets and pathologic fractues to C- and T-spine Restrictions Weight Bearing Restrictions: No    Mobility  Bed Mobility Overal bed mobility: Needs Assistance Bed Mobility: Rolling;Supine to Sit;Sit to Supine Rolling: Mod assist   Supine to sit: Mod assist;+2 for physical assistance;+2 for safety/equipment Sit to supine: Mod assist;+2 for physical assistance;+2 for safety/equipment   General bed mobility comments: Assist for trunk and bil LEs. Utilized bedpad to assist to postioning. Increased time.   Transfers                 General transfer comment: NT-pt unable. Too weak. Poor trunk control and weight shifting ability. High fall risk.   Ambulation/Gait                 Stairs             Wheelchair Mobility    Modified Rankin (Stroke Patients Only)       Balance Overall balance assessment: Needs  assistance Sitting-balance support: Bilateral upper extremity supported Sitting balance-Leahy Scale: Poor Sitting balance - Comments: unable to accept challenges without LOB. Worked on anterior reaching.      Standing balance-Leahy Scale: Poor                              Cognition Arousal/Alertness: Awake/alert Behavior During Therapy: WFL for tasks assessed/performed Overall Cognitive Status: Within Functional Limits for tasks assessed                                 General Comments: Pt tearful and frustrated about current condition and mobility status      Exercises      General Comments        Pertinent Vitals/Pain Pain Assessment: 0-10 Pain Score: 9  Pain Location: bilateral hips,back  Pain Descriptors / Indicators: Grimacing;Guarding;Jabbing Pain Intervention(s): Limited activity within patient's tolerance;Repositioned    Home Living                      Prior Function            PT Goals (current goals can now be found in the care plan section) Progress towards PT goals: Progressing toward goals    Frequency    Min 2X/week      PT Plan Current plan remains  appropriate    Co-evaluation              AM-PAC PT "6 Clicks" Mobility   Outcome Measure  Help needed turning from your back to your side while in a flat bed without using bedrails?: Total Help needed moving from lying on your back to sitting on the side of a flat bed without using bedrails?: Total Help needed moving to and from a bed to a chair (including a wheelchair)?: Total Help needed standing up from a chair using your arms (e.g., wheelchair or bedside chair)?: Total Help needed to walk in hospital room?: Total Help needed climbing 3-5 steps with a railing? : Total 6 Click Score: 6    End of Session   Activity Tolerance: Patient limited by pain;Patient limited by fatigue Patient left: in bed;with call bell/phone within reach;with bed alarm  set   PT Visit Diagnosis: Other symptoms and signs involving the nervous system (R29.898);Muscle weakness (generalized) (M62.81);Other abnormalities of gait and mobility (R26.89)     Time: 7493-5521 PT Time Calculation (min) (ACUTE ONLY): 31 min  Charges:  $Therapeutic Activity: 23-37 mins                        Weston Anna, PT Acute Rehabilitation Services Pager: [phone] Office: [phone]

## 2018-08-30 NOTE — Progress Notes (Signed)
Kristina Mcfarland   RAQ:[birth year]   HL#:[id number]   HTD#:[id number]  Oncology follow up note   Subjective: Pt's pain overall improved, she feels the patch is very helpful.  She was able to sit on the edge of bed, but has not been able to walk in the night.  Patient does not seem to be very motivated to participate physical therapy. She has not been able to discharged from hospital to SNF due to lack of insurance coverage.   Objective:  Vitals:   08/30/18 0505 08/30/18 1404  BP: 121/84 105/87  Pulse: 81 (!) 116  Resp: 16 16  Temp: 98.1 F (36.7 C) 98.5 F (36.9 C)  SpO2: 99% 100%    Body mass index is 23.02 kg/m.  Intake/Output Summary (Last 24 hours) at 08/30/2018 2007 Last data filed at 08/30/2018 1030 Gross per 24 hour  Intake 120 ml  Output -  Net 120 ml     Sclerae unicteric  Oropharynx clear  No peripheral adenopathy  Lungs clear -- no rales or rhonchi  Heart regular rate and rhythm  Abdomen benign  Neuro nonfocal   CBG (last 3)  No results for input(s): GLUCAP in the last 72 hours.   Labs:  Lab Results  Component Value Date   WBC 3.0 (L) 08/30/2018   HGB 11.8 (L) 08/30/2018   HCT 37.7 08/30/2018   MCV 90.2 08/30/2018   PLT 61 (L) 08/30/2018   NEUTROABS 12.5 (H) 08/19/2018   CMP Latest Ref Rng & Units 08/30/2018 08/29/2018 08/28/2018  Glucose 70 - 99 mg/dL 243(H) 260(H) 256(H)  BUN 8 - 23 mg/dL 27(H) 30(H) 32(H)  Creatinine 0.44 - 1.00 mg/dL 0.33(L) 0.35(L) <0.30(L)  Sodium 135 - 145 mmol/L 135 136 136  Potassium 3.5 - 5.1 mmol/L 4.7 4.2 4.3  Chloride 98 - 111 mmol/L 102 103 105  CO2 22 - 32 mmol/L '25 24 22  ' Calcium 8.9 - 10.3 mg/dL 8.1(L) 7.8(L) 8.0(L)  Total Protein 6.5 - 8.1 g/dL - - -  Total Bilirubin 0.3 - 1.2 mg/dL - - -  Alkaline Phos 38 - 126 U/L - - -  AST 15 - 41 U/L - - -  ALT 0 - 44 U/L - - -     Urine Studies No results for input(s): UHGB, CRYS in the last 72 hours.  Invalid input(s): UACOL, UAPR, USPG, UPH, UTP, UGL, UKET, UBIL, UNIT, UROB,  [place], UEPI, UWBC, Duwayne Heck [place], [place]  Basic Metabolic Panel: Recent Labs  Lab 08/24/18 0626 08/27/18 0634 08/28/18 0357 08/29/18 0340 08/30/18 0331  NA 137 136 136 136 135  K 3.8 3.9 4.3 4.2 4.7  CL 102 103 105 103 102  CO2 '26 25 22 24 25  ' GLUCOSE 172* 169* 256* 260* 243*  BUN 41* 32* 32* 30* 27*  CREATININE 0.30* 0.36* <0.30* 0.35* 0.33*  CALCIUM 8.4* 8.0* 8.0* 7.8* 8.1*  MG  --   --   --   --  2.2   GFR Estimated Creatinine Clearance: 57.7 mL/min (A) (by C-G formula based on SCr of 0.33 mg/dL (L)). Liver Function Tests: No results for input(s): AST, ALT, ALKPHOS, BILITOT, PROT, ALBUMIN in the last 168 hours. No results for input(s): LIPASE, AMYLASE in the last 168 hours. No results for input(s): AMMONIA in the last 168 hours. Coagulation profile Recent Labs  Lab 08/29/18 0340  INR 1.03    CBC: Recent Labs  Lab 08/24/18 0626 08/27/18 0634 08/28/18 0357 08/29/18 0340 08/30/18 0331  WBC 3.2*  2.3* 2.4* 2.9* 3.0*  HGB 13.0 12.4 11.4* 10.8* 11.8*  HCT 40.2 37.5 35.5* 34.0* 37.7  MCV 87.6 85.2 90.3 91.9 90.2  PLT 112* 81* 71* 68*  68* 61*   Cardiac Enzymes: No results for input(s): CKTOTAL, CKMB, CKMBINDEX, TROPONINI in the last 168 hours. BNP: Invalid input(s): POCBNP CBG: No results for input(s): GLUCAP in the last 168 hours. D-Dimer Recent Labs    08/29/18 0340  DDIMER 0.95*   Hgb A1c No results for input(s): HGBA1C in the last 72 hours. Lipid Profile No results for input(s): CHOL, HDL, LDLCALC, TRIG, CHOLHDL, LDLDIRECT in the last 72 hours. Thyroid function studies No results for input(s): TSH, T4TOTAL, T3FREE, THYROIDAB in the last 72 hours.  Invalid input(s): FREET3 Anemia work up No results for input(s): VITAMINB12, FOLATE, FERRITIN, TIBC, IRON, RETICCTPCT in the last 72 hours. Microbiology No results found for this or any previous visit (from the past 240 hour(s)).    Studies:  No results found.  Assessment: 63 y.o.  postmenopausal Caucasian female, with past medical history of SVT, palpable left breast mass 4 months ago, presented with severe low back and bilateral hip pain  1.Diffuse metastatic breast cancer to bones, ER+/PR-/HER2-  2.Severe back pain and low extremity weakness, secondary to #1 3. Pancytopenia, secondary to diffuse bone mets and radiation, rule out DIC  4. Worsening hyperglycemia, secondary to steroids  5. History of SVT 6. Deconditioning     Plan:  -She has started letrozole on August 29, 2017, tolerating well. -I started her on Ibrance at low-dose 75 mg daily, 3 weeks on, one-week off, for metastatic breast cancer.  She tolerated first dose well.  Dose was reduced due to her cytopenias.  Leslee Home can cause significant cytopenias, especially neutropenia, usually happens in the third week, will continue monitoring. -continue pain, she is currently on MS Contin 15 mg every 8 hours, total 50 mg as needed, 4 hours, lidocaine patch, hydromorphone injection as needed, gabapentin 100 mg daily at night, appreciate palliative care's input  -I encourage her to participate PT -please check CBC every 2-3 days  -I will f/u next week, please call with questions     Truitt Merle, MD 08/30/2018

## 2018-08-31 DIAGNOSIS — R739 Hyperglycemia, unspecified: Secondary | ICD-10-CM

## 2018-08-31 DIAGNOSIS — D61818 Other pancytopenia: Secondary | ICD-10-CM | POA: Diagnosis present

## 2018-08-31 DIAGNOSIS — T380X5A Adverse effect of glucocorticoids and synthetic analogues, initial encounter: Secondary | ICD-10-CM

## 2018-08-31 LAB — GLUCOSE, CAPILLARY: Glucose-Capillary: 247 mg/dL — ABNORMAL HIGH (ref 70–99)

## 2018-08-31 MED ORDER — INSULIN ASPART 100 UNIT/ML ~~LOC~~ SOLN
0.0000 [IU] | Freq: Three times a day (TID) | SUBCUTANEOUS | Status: DC
  Administered 2018-08-31: 3 [IU] via SUBCUTANEOUS
  Administered 2018-09-01: 7 [IU] via SUBCUTANEOUS
  Administered 2018-09-01: 2 [IU] via SUBCUTANEOUS
  Administered 2018-09-01: 5 [IU] via SUBCUTANEOUS
  Administered 2018-09-02 – 2018-09-04 (×7): 2 [IU] via SUBCUTANEOUS
  Administered 2018-09-04: 3 [IU] via SUBCUTANEOUS
  Administered 2018-09-05: 2 [IU] via SUBCUTANEOUS
  Administered 2018-09-05: 3 [IU] via SUBCUTANEOUS
  Administered 2018-09-05 – 2018-09-06 (×4): 2 [IU] via SUBCUTANEOUS
  Administered 2018-09-07: 1 [IU] via SUBCUTANEOUS
  Administered 2018-09-08: 3 [IU] via SUBCUTANEOUS
  Administered 2018-09-09: 1 [IU] via SUBCUTANEOUS
  Administered 2018-09-09: 2 [IU] via SUBCUTANEOUS
  Administered 2018-09-10 – 2018-09-13 (×6): 1 [IU] via SUBCUTANEOUS

## 2018-08-31 NOTE — Progress Notes (Signed)
PROGRESS NOTE    Kristina Mcfarland  XNA:[id number] DOB: [birth year] DOA: 08/19/2018 PCP: Willeen Niece, PA   Brief Narrative:  63 year old woman PMH relevant for SVT and widespread metastatic breast cancer with extensive involvement of the cervical, thoracic and lumbar spine, diagnosed late December 2019, currently receiving chemotherapy, presented with intractable back pain, leg spasms, inability to ambulate. She also has pancytopenia. Started on iv steroid which also complicated with hyperglycemia. Oncology consulted. Pain management started. PT/OT recommended SNF but discharge is a challenge due to pt is lack of insurance.    Assessment & Plan:   Principal Problem:   Intractable back pain Active Problems:   Bone metastasis ([facility])   Cancer associated pain   Weakness of both lower extremities   Metastatic breast cancer ([facility])   Steroid-induced hyperglycemia   Pancytopenia ([facility])   #) Widely metastatic breast cancer with intractable back pain: Patient mainly has extensive involvement of the cervical, thoracic, lumbar spine on MRI with no evidence of cord compression or embarrassment at this time.  She does have multiple pathologic compression fractures.  These however appear to be stable from prior MRI approximately 1 month ago.  It appears primarily inability to ambulate secondary to back pain.  Complicating the fact is that she cannot afford a skilled nursing facility due to insurance problems. -Continue MS Contin 15 mg bid and Oxycodone 15 mg q4h PRN or iv dilaudid for moderate to severe breakthrough pain -- continue lidoderm patch which pt felt helpful -Continue gabapentin 100 mg nightly - Continue dexamethasone 4 mg every 8 hours -Continue duloxetine 30 mg daily -Continue cyclobenzaprine 10 mg 3 times daily -Palliative care following, appreciate recommendations -Oncology following along -- encourage PT/OT exercise --CM/SW consulted for discharge planning which is a challenge due to no  insurance coverage -- continue Femara and palbociclib for breast cancer -- avoid constipation with stool softener and PRN laxatives.   #) pancytopenia: Possibly secondary to bone marrow infiltration -- monitor CBC; so far stable  #) steroid induced hyperglycemia  -- add sliding scale for hyperglycemia   Fluids: Tolerating p.o. Electrolytes: Monitor and supplement Nutrition: Regular diet   Prophylaxis:Thrombocytopenia  Disposition: Pending ability to ambulate, patient is self-pay and so cannot afford SNF  Full code   Consultants:   Oncology  Palliative care  Procedures:   none  Antimicrobials:   none    Subjective: Pain improved with long/short acting narcotics as well as lidoderm patch; participated PT yesterday; hyperglycemia not controlled in high 200's but no insulin coverage. Pancytopenia stable  Objective: Vitals:   08/30/18 0505 08/30/18 1404 08/30/18 2048 08/31/18 0523  BP: 121/84 105/87 110/84 101/81  Pulse: 81 (!) 116 (!) 104 (!) 111  Resp: 16 16 17 17   Temp: 98.1 F (36.7 C) 98.5 F (36.9 C) 98.2 F (36.8 C) (!) 97.5 F (36.4 C)  TempSrc: Oral Oral Oral Oral  SpO2: 99% 100% 99% 99%  Weight:      Height:        Intake/Output Summary (Last 24 hours) at 08/31/2018 1245 Last data filed at 08/31/2018 0518 Gross per 24 hour  Intake -  Output 400 ml  Net -400 ml   Filed Weights   08/19/18 1848 08/20/18 0437  Weight: 54.4 kg 57.1 kg    Examination:  General exam: Appears calm and comfortable  Respiratory system: Clear to auscultation. Respiratory effort normal. Cardiovascular system: Regular rate and rhythm, no murmurs Gastrointestinal system: Abdomen is nondistended, soft and nontender. No organomegaly or masses felt. Normal  bowel sounds heard. Central nervous system: Alert and oriented.  Lower extremity strength is 4 out of 5 Extremities: No lower extremity edema Skin: No rashes over visible skin Psychiatry: Judgement and insight appear  normal. Mood & affect appropriate.     Data Reviewed: I have personally reviewed following labs and imaging studies  CBC: Recent Labs  Lab 08/27/18 0634 08/28/18 0357 08/29/18 0340 08/30/18 0331  WBC 2.3* 2.4* 2.9* 3.0*  HGB 12.4 11.4* 10.8* 11.8*  HCT 37.5 35.5* 34.0* 37.7  MCV 85.2 90.3 91.9 90.2  PLT 81* 71* 68*  68* 61*   Basic Metabolic Panel: Recent Labs  Lab 08/27/18 0634 08/28/18 0357 08/29/18 0340 08/30/18 0331  NA 136 136 136 135  K 3.9 4.3 4.2 4.7  CL 103 105 103 102  CO2 25 22 24 25   GLUCOSE 169* 256* 260* 243*  BUN 32* 32* 30* 27*  CREATININE 0.36* <0.30* 0.35* 0.33*  CALCIUM 8.0* 8.0* 7.8* 8.1*  MG  --   --   --  2.2   GFR: Estimated Creatinine Clearance: 57.7 mL/min (A) (by C-G formula based on SCr of 0.33 mg/dL (L)). Liver Function Tests: No results for input(s): AST, ALT, ALKPHOS, BILITOT, PROT, ALBUMIN in the last 168 hours. No results for input(s): LIPASE, AMYLASE in the last 168 hours. No results for input(s): AMMONIA in the last 168 hours. Coagulation Profile: Recent Labs  Lab 08/29/18 0340  INR 1.03   Cardiac Enzymes: No results for input(s): CKTOTAL, CKMB, CKMBINDEX, TROPONINI in the last 168 hours. BNP (last 3 results) No results for input(s): PROBNP in the last 8760 hours. HbA1C: No results for input(s): HGBA1C in the last 72 hours. CBG: No results for input(s): GLUCAP in the last 168 hours. Lipid Profile: No results for input(s): CHOL, HDL, LDLCALC, TRIG, CHOLHDL, LDLDIRECT in the last 72 hours. Thyroid Function Tests: No results for input(s): TSH, T4TOTAL, FREET4, T3FREE, THYROIDAB in the last 72 hours. Anemia Panel: No results for input(s): VITAMINB12, FOLATE, FERRITIN, TIBC, IRON, RETICCTPCT in the last 72 hours. Sepsis Labs: No results for input(s): PROCALCITON, LATICACIDVEN in the last 168 hours.  No results found for this or any previous visit (from the past 240 hour(s)).       Radiology Studies: No results  found.      Scheduled Meds: . bisacodyl  10 mg Rectal Once  . buPROPion  150 mg Oral Daily  . cyclobenzaprine  10 mg Oral TID  . dexamethasone  4 mg Oral Q8H  . DULoxetine  30 mg Oral Daily  . famotidine  20 mg Oral BID  . feeding supplement (ENSURE ENLIVE)  237 mL Oral BID BM  . fluconazole  200 mg Oral Daily  . gabapentin  100 mg Oral QHS  . letrozole  2.5 mg Oral Daily  . lidocaine  1 patch Transdermal Q24H  . magic mouthwash w/lidocaine  10 mL Oral TID  . morphine  15 mg Oral Q8H  . palbociclib  75 mg Oral Q breakfast  . polyethylene glycol  17 g Oral BID  . senna-docusate  2 tablet Oral BID   Continuous Infusions:    LOS: 11 days    Time spent: 35 min    Paticia Stack, MD [facility]  If 7PM-7AM, please contact night-coverage [email or web address] Password [facility] 08/31/2018, 12:45 PM

## 2018-09-01 LAB — GLUCOSE, CAPILLARY
Glucose-Capillary: 303 mg/dL — ABNORMAL HIGH (ref 70–99)
Glucose-Capillary: 253 mg/dL — ABNORMAL HIGH (ref 70–99)
Glucose-Capillary: 165 mg/dL — ABNORMAL HIGH (ref 70–99)
Glucose-Capillary: 143 mg/dL — ABNORMAL HIGH (ref 70–99)

## 2018-09-01 LAB — CBC
HCT: 37.3 % (ref 36.0–46.0)
Hemoglobin: 11.8 g/dL — ABNORMAL LOW (ref 12.0–15.0)
MCH: 29.1 pg (ref 26.0–34.0)
MCHC: 31.6 g/dL (ref 30.0–36.0)
MCV: 92.1 fL (ref 80.0–100.0)
Platelets: 62 10*3/uL — ABNORMAL LOW (ref 150–400)
RBC: 4.05 MIL/uL (ref 3.87–5.11)
RDW: 18.3 % — ABNORMAL HIGH (ref 11.5–15.5)
WBC: 2.8 10*3/uL — ABNORMAL LOW (ref 4.0–10.5)
nRBC: 1.8 % — ABNORMAL HIGH (ref 0.0–0.2)

## 2018-09-01 MED ORDER — DEXAMETHASONE 4 MG PO TABS
4.0000 mg | ORAL_TABLET | Freq: Two times a day (BID) | ORAL | Status: DC
  Administered 2018-09-01 – 2018-09-13 (×21): 4 mg via ORAL
  Filled 2018-09-01 (×23): qty 1

## 2018-09-01 NOTE — Progress Notes (Signed)
PROGRESS NOTE    Kristina Mcfarland  AYT:[id number] DOB: 09-11-1955 DOA: 08/19/2018 PCP: Willeen Niece, PA   Brief Narrative:  63 year old woman PMH relevant for SVT and widespread metastatic breast cancer with extensive involvement of the cervical, thoracic and lumbar spine, diagnosed late December 2019, currently receiving chemotherapy, presented with intractable back pain, leg spasms, inability to ambulate. She also has pancytopenia. Started on iv steroid which also complicated with hyperglycemia (treated with SSI). Oncology consulted. Pain management started. PT/OT recommended SNF but discharge is a challenge due to pt is lack of insurance.    Assessment & Plan:   Principal Problem:   Intractable back pain Active Problems:   Bone metastasis (HCC)   Cancer associated pain   Weakness of both lower extremities   Metastatic breast cancer (HCC)   Steroid-induced hyperglycemia   Pancytopenia (HCC)   #) Widely metastatic breast cancer with intractable back pain: Patient mainly has extensive involvement of the cervical, thoracic, lumbar spine on MRI with no evidence of cord compression or embarrassment at this time.  She does have multiple pathologic compression fractures.  These however appear to be stable from prior MRI approximately 1 month ago.  It appears primarily inability to ambulate secondary to back pain.  Complicating the fact is that she cannot afford a skilled nursing facility due to insurance problems. -Continue MS Contin 15 mg bid and Oxycodone 15 mg q4h PRN or iv dilaudid for moderate to severe breakthrough pain -- continue lidoderm patch which pt felt helpful -Continue gabapentin 100 mg nightly - change decadron to 4 mg bid (treated with 4 mg tid for 3 days) -Continue duloxetine 30 mg daily -Continue cyclobenzaprine 10 mg 3 times daily -Palliative care following, appreciate recommendations -Oncology following along -- encourage PT/OT exercise --CM/SW consulted for discharge  planning which is a challenge due to no insurance coverage -- continue Femara and palbociclib for breast cancer -- avoid constipation with stool softener and PRN laxatives.   #) pancytopenia: Possibly secondary to bone marrow infiltration -- monitor CBC; so far stable  #) steroid induced hyperglycemia  -- continue sliding scale for hyperglycemia   Fluids: Tolerating p.o. Electrolytes: Monitor and supplement Nutrition: Regular diet   Prophylaxis:Thrombocytopenia  Disposition: Pending ability to ambulate, patient is self-pay and so cannot afford SNF  Full code   Consultants:   Oncology  Palliative care  Procedures:   none  Antimicrobials:   none    Subjective: Pain improved with long/short acting narcotics as well as lidoderm patch; hyperglycemia persisted but is covered with SSI. Pancytopenia stable  Objective: Vitals:   08/31/18 0523 08/31/18 1300 08/31/18 2126 09/01/18 0556  BP: 101/81 (!) 112/95 114/88 106/84  Pulse: (!) 111 (!) 107 (!) 105 (!) 111  Resp: 17 16 16 17   Temp: (!) 97.5 F (36.4 C) 97.9 F (36.6 C) 97.8 F (36.6 C) 98 F (36.7 C)  TempSrc: Oral Oral Oral Oral  SpO2: 99% 98% 99% 100%  Weight:      Height:        Intake/Output Summary (Last 24 hours) at 09/01/2018 1108 Last data filed at 09/01/2018 0950 Gross per 24 hour  Intake 240 ml  Output 670 ml  Net -430 ml   Filed Weights   08/19/18 1848 08/20/18 0437  Weight: 54.4 kg 57.1 kg    Examination:  General exam: Appears calm and comfortable  Respiratory system: Clear to auscultation. Respiratory effort normal. Cardiovascular system: Regular rate and rhythm, no murmurs Gastrointestinal system: Abdomen is nondistended, soft and  nontender. No organomegaly or masses felt. Normal bowel sounds heard. Central nervous system: Alert and oriented.  Lower extremity strength is 4 out of 5 Extremities: No lower extremity edema Skin: No rashes over visible skin Psychiatry: Judgement and  insight appear normal. Mood & affect appropriate.     Data Reviewed: I have personally reviewed following labs and imaging studies  CBC: Recent Labs  Lab 08/27/18 0634 08/28/18 0357 08/29/18 0340 08/30/18 0331 09/01/18 0518  WBC 2.3* 2.4* 2.9* 3.0* 2.8*  HGB 12.4 11.4* 10.8* 11.8* 11.8*  HCT 37.5 35.5* 34.0* 37.7 37.3  MCV 85.2 90.3 91.9 90.2 92.1  PLT 81* 71* 68*  68* 61* 62*   Basic Metabolic Panel: Recent Labs  Lab 08/27/18 0634 08/28/18 0357 08/29/18 0340 08/30/18 0331  NA 136 136 136 135  K 3.9 4.3 4.2 4.7  CL 103 105 103 102  CO2 25 22 24 25   GLUCOSE 169* 256* 260* 243*  BUN 32* 32* 30* 27*  CREATININE 0.36* <0.30* 0.35* 0.33*  CALCIUM 8.0* 8.0* 7.8* 8.1*  MG  --   --   --  2.2   GFR: Estimated Creatinine Clearance: 57.7 mL/min (A) (by C-G formula based on SCr of 0.33 mg/dL (L)). Liver Function Tests: No results for input(s): AST, ALT, ALKPHOS, BILITOT, PROT, ALBUMIN in the last 168 hours. No results for input(s): LIPASE, AMYLASE in the last 168 hours. No results for input(s): AMMONIA in the last 168 hours. Coagulation Profile: Recent Labs  Lab 08/29/18 0340  INR 1.03   Cardiac Enzymes: No results for input(s): CKTOTAL, CKMB, CKMBINDEX, TROPONINI in the last 168 hours. BNP (last 3 results) No results for input(s): PROBNP in the last 8760 hours. HbA1C: No results for input(s): HGBA1C in the last 72 hours. CBG: Recent Labs  Lab 08/31/18 1718 09/01/18 0756  GLUCAP 247* 303*   Lipid Profile: No results for input(s): CHOL, HDL, LDLCALC, TRIG, CHOLHDL, LDLDIRECT in the last 72 hours. Thyroid Function Tests: No results for input(s): TSH, T4TOTAL, FREET4, T3FREE, THYROIDAB in the last 72 hours. Anemia Panel: No results for input(s): VITAMINB12, FOLATE, FERRITIN, TIBC, IRON, RETICCTPCT in the last 72 hours. Sepsis Labs: No results for input(s): PROCALCITON, LATICACIDVEN in the last 168 hours.  No results found for this or any previous visit (from  the past 240 hour(s)).       Radiology Studies: No results found.      Scheduled Meds: . bisacodyl  10 mg Rectal Once  . buPROPion  150 mg Oral Daily  . cyclobenzaprine  10 mg Oral TID  . dexamethasone  4 mg Oral Q8H  . DULoxetine  30 mg Oral Daily  . famotidine  20 mg Oral BID  . feeding supplement (ENSURE ENLIVE)  237 mL Oral BID BM  . fluconazole  200 mg Oral Daily  . gabapentin  100 mg Oral QHS  . insulin aspart  0-9 Units Subcutaneous TID WC  . letrozole  2.5 mg Oral Daily  . lidocaine  1 patch Transdermal Q24H  . magic mouthwash w/lidocaine  10 mL Oral TID  . morphine  15 mg Oral Q8H  . palbociclib  75 mg Oral Q breakfast  . polyethylene glycol  17 g Oral BID  . senna-docusate  2 tablet Oral BID   Continuous Infusions:    LOS: 12 days    Time spent: 35 min    Paticia Stack, MD [facility]  If 7PM-7AM, please contact night-coverage [email or web address] Password [email or web address] 09/01/2018, 11:08 AM

## 2018-09-02 LAB — GLUCOSE, CAPILLARY
GLUCOSE-CAPILLARY: 165 mg/dL — AB (ref 70–99)
Glucose-Capillary: 185 mg/dL — ABNORMAL HIGH (ref 70–99)
Glucose-Capillary: 214 mg/dL — ABNORMAL HIGH (ref 70–99)
Glucose-Capillary: 198 mg/dL — ABNORMAL HIGH (ref 70–99)

## 2018-09-02 LAB — CBC
HCT: 36.4 % (ref 36.0–46.0)
Hemoglobin: 11.7 g/dL — ABNORMAL LOW (ref 12.0–15.0)
MCH: 29 pg (ref 26.0–34.0)
MCHC: 32.1 g/dL (ref 30.0–36.0)
MCV: 90.1 fL (ref 80.0–100.0)
Platelets: 46 10*3/uL — ABNORMAL LOW (ref 150–400)
RBC: 4.04 MIL/uL (ref 3.87–5.11)
RDW: 18.2 % — ABNORMAL HIGH (ref 11.5–15.5)
WBC: 1.8 10*3/uL — ABNORMAL LOW (ref 4.0–10.5)
nRBC: 2.7 % — ABNORMAL HIGH (ref 0.0–0.2)

## 2018-09-02 NOTE — Progress Notes (Signed)
PROGRESS NOTE    Kristina Mcfarland  CBU:[id number] DOB: [birth year] DOA: 08/19/2018 PCP: Willeen Niece, PA   Brief Narrative:  63 year old woman PMH relevant for SVT and metastatic breast cancer with extensive involvement of the cervical, thoracic and lumbar spine, diagnosed late December 2019, currently receiving chemotherapy, presented with worsening back pain, leg spasms, inability to ambulate.    Assessment & Plan:   Principal Problem:   Intractable back pain Active Problems:   Bone metastasis (HCC)   Cancer associated pain   Weakness of both lower extremities   Metastatic breast cancer (HCC)   Steroid-induced hyperglycemia   Pancytopenia (HCC)   #) Widely metastatic breast cancer/back pain: Patient mainly has extensive involvement of the cervical, thoracic, lumbar spine on MRI with no evidence of cord compression or embarrassment at this time.  She does have multiple pathologic compression fractures.  These however appear to be stable from prior MRI approximately 1 month ago.  It appears primarily inability to ambulate secondary to back pain.  Complicating the fact is that she cannot afford a skilled nursing facility due to insurance problems. -Continue MS Contin to every 8 hours -Continue gabapentin 100 mg nightly - Continue dexamethasone 8 mg every 8 hours -Continue duloxetine 30 mg daily -Continue cyclobenzaprine 10 mg 3 times daily -continue PRN oxycodone -Palliative care following, appreciate recommendations -Oncology following  #) Thrombocytopenia/leukopenia: Possibly secondary to bone marrow infiltration  #) History of SVT: This is not an active issue at this time  Fluids: Tolerating p.o. Electrolytes: Monitor and supplement Nutrition: Regular diet   Prophylaxis:Thrombocytopenia  Disposition: Pending ability to ambulate, patient is self-pay and so cannot afford SNF  Full code   Consultants:   Oncology  Palliative care  Procedures:    none  Antimicrobials:   none    Subjective: This morning patient reports that she is doing better with less pain.  She reports she did not work with physical therapy over the weekend as a were not present.  She denies any nausea, vomiting, diarrhea, cough, congestion.  Objective: Vitals:   09/01/18 0556 09/01/18 1330 09/01/18 2121 09/02/18 0542  BP: 106/84 104/81 116/86 105/62  Pulse: (!) 111 (!) 108 (!) 101 (!) 106  Resp: 17 16 17 16   Temp: 98 F (36.7 C) 97.9 F (36.6 C) 98 F (36.7 C) (!) 97.5 F (36.4 C)  TempSrc: Oral Oral Oral Oral  SpO2: 100% 98% 99% 95%  Weight:      Height:        Intake/Output Summary (Last 24 hours) at 09/02/2018 1213 Last data filed at 09/02/2018 0956 Gross per 24 hour  Intake 780 ml  Output 500 ml  Net 280 ml   Filed Weights   08/19/18 1848 08/20/18 0437  Weight: 54.4 kg 57.1 kg    Examination:  General exam: Appears calm and comfortable  Respiratory system: Clear to auscultation. Respiratory effort normal. Cardiovascular system: Regular rate and rhythm, no murmurs Gastrointestinal system: Abdomen is nondistended, soft and nontender. No organomegaly or masses felt. Normal bowel sounds heard. Central nervous system: Alert and oriented.  Lower extremity strength is 4+ out of 5 Extremities: No lower extremity edema Skin: No rashes over visible skin Psychiatry: Judgement and insight appear normal. Mood & affect appropriate.     Data Reviewed: I have personally reviewed following labs and imaging studies  CBC: Recent Labs  Lab 08/28/18 0357 08/29/18 0340 08/30/18 0331 09/01/18 0518 09/02/18 0455  WBC 2.4* 2.9* 3.0* 2.8* 1.8*  HGB 11.4* 10.8* 11.8* 11.8*  11.7*  HCT 35.5* 34.0* 37.7 37.3 36.4  MCV 90.3 91.9 90.2 92.1 90.1  PLT 71* 68*  68* 61* 62* 46*   Basic Metabolic Panel: Recent Labs  Lab 08/27/18 0634 08/28/18 0357 08/29/18 0340 08/30/18 0331  NA 136 136 136 135  K 3.9 4.3 4.2 4.7  CL 103 105 103 102  CO2 25  22 24 25   GLUCOSE 169* 256* 260* 243*  BUN 32* 32* 30* 27*  CREATININE 0.36* <0.30* 0.35* 0.33*  CALCIUM 8.0* 8.0* 7.8* 8.1*  MG  --   --   --  2.2   GFR: Estimated Creatinine Clearance: 57.7 mL/min (A) (by C-G formula based on SCr of 0.33 mg/dL (L)). Liver Function Tests: No results for input(s): AST, ALT, ALKPHOS, BILITOT, PROT, ALBUMIN in the last 168 hours. No results for input(s): LIPASE, AMYLASE in the last 168 hours. No results for input(s): AMMONIA in the last 168 hours. Coagulation Profile: Recent Labs  Lab 08/29/18 0340  INR 1.03   Cardiac Enzymes: No results for input(s): CKTOTAL, CKMB, CKMBINDEX, TROPONINI in the last 168 hours. BNP (last 3 results) No results for input(s): PROBNP in the last 8760 hours. HbA1C: No results for input(s): HGBA1C in the last 72 hours. CBG: Recent Labs  Lab 09/01/18 1138 09/01/18 1654 09/01/18 2149 09/02/18 0742 09/02/18 1143  GLUCAP 253* 165* 143* 185* 165*   Lipid Profile: No results for input(s): CHOL, HDL, LDLCALC, TRIG, CHOLHDL, LDLDIRECT in the last 72 hours. Thyroid Function Tests: No results for input(s): TSH, T4TOTAL, FREET4, T3FREE, THYROIDAB in the last 72 hours. Anemia Panel: No results for input(s): VITAMINB12, FOLATE, FERRITIN, TIBC, IRON, RETICCTPCT in the last 72 hours. Sepsis Labs: No results for input(s): PROCALCITON, LATICACIDVEN in the last 168 hours.  No results found for this or any previous visit (from the past 240 hour(s)).       Radiology Studies: No results found.      Scheduled Meds: . bisacodyl  10 mg Rectal Once  . buPROPion  150 mg Oral Daily  . cyclobenzaprine  10 mg Oral TID  . dexamethasone  4 mg Oral Q12H  . DULoxetine  30 mg Oral Daily  . famotidine  20 mg Oral BID  . feeding supplement (ENSURE ENLIVE)  237 mL Oral BID BM  . fluconazole  200 mg Oral Daily  . gabapentin  100 mg Oral QHS  . insulin aspart  0-9 Units Subcutaneous TID WC  . letrozole  2.5 mg Oral Daily  .  lidocaine  1 patch Transdermal Q24H  . magic mouthwash w/lidocaine  10 mL Oral TID  . morphine  15 mg Oral Q8H  . palbociclib  75 mg Oral Q breakfast  . polyethylene glycol  17 g Oral BID  . senna-docusate  2 tablet Oral BID   Continuous Infusions:    LOS: 13 days    Time spent: [place], MD [facility]  If 7PM-7AM, please contact night-coverage [email or web address] Password [facility] 09/02/2018, 12:13 PM

## 2018-09-02 NOTE — Progress Notes (Signed)
Physical Therapy Treatment Patient Details Name: Kristina Mcfarland MRN: [id number] DOB: 05-Feb-1956 Today's Date: 09/02/2018    History of Present Illness  63 yo female with diagnosis of breast cancer stage IV admitted to ED on 1/6 for pain. Pt recently admitted on 12/26 for LBP with R leg radiations x 1 month. Pt also with L arm numbness. Imaging revealed cervical and thoracic spine pathologic fractures, and lesions to spine. Other notable PMH includes SVT, back surgery.      PT Comments    The patient is  Lethargic, had been premedicated for therAPY. The patient 's speech slurred. Assisted with AA/PROM BLE's. AROM UE. Did not attempt sitting as  Patient indicated pain and stated" I don't want to hurt anymore".   Follow Up Recommendations  SNF     Equipment Recommendations  Hospital bed;Wheelchair (measurements PT);Wheelchair cushion (measurements PT);3in1 (PT)    Recommendations for Other Services       Precautions / Restrictions Precautions Precautions: Fall Precaution Comments: Mets and pathologic fractues to C- and T-spine    Mobility  Bed Mobility               General bed mobility comments: deferred due to lethargy  Transfers                    Ambulation/Gait                 Stairs             Wheelchair Mobility    Modified Rankin (Stroke Patients Only)       Balance                                            Cognition Arousal/Alertness: Lethargic;Suspect due to medications                                     General Comments: patient's speach is  garbled, at times not sensible.       Exercises General Exercises - Lower Extremity Ankle Circles/Pumps: AROM;Both;10 reps;Supine Heel Slides: AAROM;Both;10 reps Hip ABduction/ADduction: AAROM;Both;10 reps    General Comments        Pertinent Vitals/Pain Faces Pain Scale: Hurts whole lot Pain Location: right thigh Pain Descriptors /  Indicators: Grimacing;Guarding Pain Intervention(s): Premedicated before session;Monitored during session    Home Living                      Prior Function            PT Goals (current goals can now be found in the care plan section) Progress towards PT goals: Progressing toward goals    Frequency    Min 2X/week      PT Plan Current plan remains appropriate    Co-evaluation              AM-PAC PT "6 Clicks" Mobility   Outcome Measure  Help needed turning from your back to your side while in a flat bed without using bedrails?: Total Help needed moving from lying on your back to sitting on the side of a flat bed without using bedrails?: Total Help needed moving to and from a bed to a chair (including a wheelchair)?: Total Help needed standing up from a chair using  your arms (e.g., wheelchair or bedside chair)?: Total Help needed to walk in hospital room?: Total Help needed climbing 3-5 steps with a railing? : Total 6 Click Score: 6    End of Session   Activity Tolerance: Patient limited by pain;Patient limited by fatigue Patient left: in bed;with call bell/phone within reach;with bed alarm set Nurse Communication: Patient requests pain meds;Mobility status PT Visit Diagnosis: Other symptoms and signs involving the nervous system (R29.898);Muscle weakness (generalized) (M62.81);Other abnormalities of gait and mobility (R26.89)     Time: 2060-1561 PT Time Calculation (min) (ACUTE ONLY): 10 min  Charges:  $Therapeutic Exercise: 8-22 mins                     Tresa Endo PT Acute Rehabilitation Services Pager [phone] Office [phone]    Claretha Cooper 09/02/2018, 5:02 PM

## 2018-09-03 LAB — CBC WITH DIFFERENTIAL/PLATELET
Abs Immature Granulocytes: 0.04 10*3/uL (ref 0.00–0.07)
Basophils Absolute: 0 10*3/uL (ref 0.0–0.1)
Basophils Relative: 1 %
Eosinophils Absolute: 0 10*3/uL (ref 0.0–0.5)
Eosinophils Relative: 0 %
HCT: 35.3 % — ABNORMAL LOW (ref 36.0–46.0)
Hemoglobin: 11.6 g/dL — ABNORMAL LOW (ref 12.0–15.0)
Immature Granulocytes: 2 %
Lymphocytes Relative: 2 %
Lymphs Abs: 0 K/uL — ABNORMAL LOW (ref 0.7–4.0)
MCH: 28.9 pg (ref 26.0–34.0)
MCHC: 32.9 g/dL (ref 30.0–36.0)
MCV: 87.8 fL (ref 80.0–100.0)
Monocytes Absolute: 0.1 10*3/uL (ref 0.1–1.0)
Monocytes Relative: 3 %
Neutro Abs: 1.7 10*3/uL (ref 1.7–7.7)
Neutrophils Relative %: 92 %
Platelets: 42 10*3/uL — ABNORMAL LOW (ref 150–400)
RBC: 4.02 MIL/uL (ref 3.87–5.11)
RDW: 18.6 % — ABNORMAL HIGH (ref 11.5–15.5)
WBC: 1.8 10*3/uL — ABNORMAL LOW (ref 4.0–10.5)
nRBC: 1.6 % — ABNORMAL HIGH (ref 0.0–0.2)

## 2018-09-03 LAB — BASIC METABOLIC PANEL WITH GFR: Anion gap: 7 (ref 5–15)

## 2018-09-03 LAB — GLUCOSE, CAPILLARY
Glucose-Capillary: 193 mg/dL — ABNORMAL HIGH (ref 70–99)
Glucose-Capillary: 172 mg/dL — ABNORMAL HIGH (ref 70–99)
Glucose-Capillary: 182 mg/dL — ABNORMAL HIGH (ref 70–99)
Glucose-Capillary: 203 mg/dL — ABNORMAL HIGH (ref 70–99)

## 2018-09-03 LAB — BASIC METABOLIC PANEL
BUN: 25 mg/dL — ABNORMAL HIGH (ref 8–23)
CO2: 25 mmol/L (ref 22–32)
Calcium: 7.9 mg/dL — ABNORMAL LOW (ref 8.9–10.3)
Chloride: 101 mmol/L (ref 98–111)
Creatinine, Ser: <0.3 mg/dL — ABNORMAL LOW (ref 0.44–1.00)
Glucose, Bld: 217 mg/dL — ABNORMAL HIGH (ref 70–99)
Potassium: 4.2 mmol/L (ref 3.5–5.1)
Sodium: 133 mmol/L — ABNORMAL LOW (ref 135–145)

## 2018-09-03 MED ORDER — CYCLOBENZAPRINE HCL 10 MG PO TABS: 10.0000 mg | ORAL_TABLET | Freq: Three times a day (TID) | ORAL | Status: DC | PRN

## 2018-09-03 NOTE — Progress Notes (Signed)
Nutrition Follow-up  INTERVENTION:   Will continue to monitor for nutrition needs  NUTRITION DIAGNOSIS:   Increased nutrient needs related to cancer and cancer related treatments as evidenced by estimated needs.  Ongoing.  GOAL:   Patient will meet greater than or equal to 90% of their needs  Progressing.  MONITOR:   PO intake, Supplement acceptance, Labs, Weight trends, I & O's ,GOC  ASSESSMENT:   63 year old woman PMH metastatic breast cancer with extensive involvement of the cervical, thoracic and lumbar spine, diagnosed late December 2019, currently receiving chemotherapy, presented with worsening back pain, leg spasms, inability to ambulate.    Patient currently consuming 50% of meals. Per MD, pt now may need to be hospice care. Will monitor for plan.    No new weight has been measured since 1/7.  Medications: Magic Mouthwash w/ lidocaine TID Labs reviewed: CBGs: 193-198 Low Na  Diet Order:   Diet Order            Diet regular Room service appropriate? Yes; Fluid consistency: Thin  Diet effective now              EDUCATION NEEDS:   No education needs have been identified at this time  Skin:  Skin Assessment: Reviewed RN Assessment  Last BM:  1/19  Height:   Ht Readings from Last 1 Encounters:  08/20/18 5\' 2"  (1.575 m)    Weight:   Wt Readings from Last 1 Encounters:  08/20/18 57.1 kg    Ideal Body Weight:  50 kg  BMI:  Body mass index is 23.02 kg/m.  Estimated Nutritional Needs:   Kcal:  1700-1900  Protein:  85-95g  Fluid:  1.9L/day  Clayton Bibles, MS, RD, LDN [facility] Pager: [phone] After Hours Pager: [phone]

## 2018-09-03 NOTE — Progress Notes (Signed)
PROGRESS NOTE    Kristina Mcfarland  OIN:[id number] DOB: 27-Dec-1955 DOA: 08/19/2018 PCP: Willeen Niece, PA   Brief Narrative:  63 year old woman PMH relevant for SVT and metastatic breast cancer with extensive involvement of the cervical, thoracic and lumbar spine, diagnosed late December 2019, currently receiving chemotherapy, presented with worsening back pain, leg spasms, inability to ambulate.    Assessment & Plan:   Principal Problem:   Intractable back pain Active Problems:   Bone metastasis (HCC)   Cancer associated pain   Weakness of both lower extremities   Metastatic breast cancer (HCC)   Steroid-induced hyperglycemia   Pancytopenia (HCC)   #) Widely metastatic breast cancer/back pain: Patient mainly has extensive involvement of the cervical, thoracic, lumbar spine on MRI with no evidence of cord compression or embarrassment at this time.  Unfortunately her inability to participate in physical therapy appears to be a combination of both mood and pain.  Increasingly it is seeming unlikely that she will benefit from any aggressive treatment.  Patient even is unable to swallow chemotherapeutics. -We will discuss with oncology about possibility of hospice -Continue MS Contin to every 8 hours -Continue gabapentin 100 mg nightly - Continue dexamethasone 8 mg every 8 hours -Continue duloxetine 30 mg daily -Continue cyclobenzaprine 10 mg 3 times daily -continue PRN oxycodone -Palliative care following, appreciate recommendations -Oncology following  #) Thrombocytopenia/leukopenia: Possibly secondary to bone marrow infiltration  #) History of SVT: This is not an active issue at this time  Fluids: Tolerating p.o. Electrolytes: Monitor and supplement Nutrition: Regular diet   Prophylaxis:Thrombocytopenia  Disposition: Pending ability to ambulate, patient is self-pay and so cannot afford SNF  Full code   Consultants:   Oncology  Palliative care  Procedures:    none  Antimicrobials:   none    Subjective: This morning patient reports that she is doing well.  She reports that she did work with physical therapy.  She denies any nausea, vomiting, diarrhea, cough, congestion, rhinorrhea.  Objective: Vitals:   09/02/18 1340 09/02/18 1435 09/02/18 2128 09/03/18 0607  BP: (!) 89/67 106/73 106/78 96/74  Pulse: (!) 122 (!) 103 99 (!) 109  Resp: 20 16 16 17   Temp: 97.8 F (36.6 C)  97.9 F (36.6 C) 99.5 F (37.5 C)  TempSrc: Oral  Oral Oral  SpO2: 100% 97% 100% 97%  Weight:      Height:        Intake/Output Summary (Last 24 hours) at 09/03/2018 1022 Last data filed at 09/03/2018 0835 Gross per 24 hour  Intake 720 ml  Output 200 ml  Net 520 ml   Filed Weights   08/19/18 1848 08/20/18 0437  Weight: 54.4 kg 57.1 kg    Examination:  General exam: Appears calm and comfortable  Respiratory system: Clear to auscultation. Respiratory effort normal. Cardiovascular system: Regular rate and rhythm, no murmurs Gastrointestinal system: Abdomen is nondistended, soft and nontender. No organomegaly or masses felt. Normal bowel sounds heard. Central nervous system: Alert and oriented.  Lower extremity strength is 4+ out of 5 Extremities: No lower extremity edema Skin: No rashes over visible skin Psychiatry: Judgement and insight appear normal. Mood & affect appropriate.     Data Reviewed: I have personally reviewed following labs and imaging studies  CBC: Recent Labs  Lab 08/29/18 0340 08/30/18 0331 09/01/18 0518 09/02/18 0455 09/03/18 0349  WBC 2.9* 3.0* 2.8* 1.8* 1.8*  NEUTROABS  --   --   --   --  1.7  HGB 10.8* 11.8* 11.8*  11.7* 11.6*  HCT 34.0* 37.7 37.3 36.4 35.3*  MCV 91.9 90.2 92.1 90.1 87.8  PLT 68*  68* 61* 62* 46* 42*   Basic Metabolic Panel: Recent Labs  Lab 08/28/18 0357 08/29/18 0340 08/30/18 0331 09/03/18 0349  NA 136 136 135 133*  K 4.3 4.2 4.7 4.2  CL 105 103 102 101  CO2 22 24 25 25   GLUCOSE 256* 260*  243* 217*  BUN 32* 30* 27* 25*  CREATININE <0.30* 0.35* 0.33* <0.30*  CALCIUM 8.0* 7.8* 8.1* 7.9*  MG  --   --  2.2  --    GFR: CrCl cannot be calculated (This lab value cannot be used to calculate CrCl because it is not a number: <0.30). Liver Function Tests: No results for input(s): AST, ALT, ALKPHOS, BILITOT, PROT, ALBUMIN in the last 168 hours. No results for input(s): LIPASE, AMYLASE in the last 168 hours. No results for input(s): AMMONIA in the last 168 hours. Coagulation Profile: Recent Labs  Lab 08/29/18 0340  INR 1.03   Cardiac Enzymes: No results for input(s): CKTOTAL, CKMB, CKMBINDEX, TROPONINI in the last 168 hours. BNP (last 3 results) No results for input(s): PROBNP in the last 8760 hours. HbA1C: No results for input(s): HGBA1C in the last 72 hours. CBG: Recent Labs  Lab 09/02/18 0742 09/02/18 1143 09/02/18 1610 09/02/18 2130 09/03/18 0730  GLUCAP 185* 165* 214* 198* 193*   Lipid Profile: No results for input(s): CHOL, HDL, LDLCALC, TRIG, CHOLHDL, LDLDIRECT in the last 72 hours. Thyroid Function Tests: No results for input(s): TSH, T4TOTAL, FREET4, T3FREE, THYROIDAB in the last 72 hours. Anemia Panel: No results for input(s): VITAMINB12, FOLATE, FERRITIN, TIBC, IRON, RETICCTPCT in the last 72 hours. Sepsis Labs: No results for input(s): PROCALCITON, LATICACIDVEN in the last 168 hours.  No results found for this or any previous visit (from the past 240 hour(s)).       Radiology Studies: No results found.      Scheduled Meds: . bisacodyl  10 mg Rectal Once  . buPROPion  150 mg Oral Daily  . dexamethasone  4 mg Oral Q12H  . DULoxetine  30 mg Oral Daily  . famotidine  20 mg Oral BID  . feeding supplement (ENSURE ENLIVE)  237 mL Oral BID BM  . fluconazole  200 mg Oral Daily  . gabapentin  100 mg Oral QHS  . insulin aspart  0-9 Units Subcutaneous TID WC  . letrozole  2.5 mg Oral Daily  . lidocaine  1 patch Transdermal Q24H  . magic mouthwash  w/lidocaine  10 mL Oral TID  . morphine  15 mg Oral Q8H  . polyethylene glycol  17 g Oral BID  . senna-docusate  2 tablet Oral BID   Continuous Infusions:    LOS: 14 days    Time spent: [place], MD [facility]  If 7PM-7AM, please contact night-coverage [email or web address] Password [facility] 09/03/2018, 10:22 AM

## 2018-09-03 NOTE — Care Management (Signed)
This CM has scheduled a family meeting for 1/22 at 12:30 with son, grandfather, Dr. Herbert Moors, Dr. Burr Medico, PM MD and CSW to collaborate on dc planning for pt. Marney Doctor RN,BSN [phone]

## 2018-09-03 NOTE — Progress Notes (Signed)
Agree with change in Dexamethasone to 4mg  BID. Would keep this going for next week, then 2 mg BID x 1 week, then 2 mg daily x 1 week, then 2 mg every other day for 1 1/2 weeks then stop.     Carola Rhine, PAC

## 2018-09-03 NOTE — Progress Notes (Signed)
Kristina Mcfarland   ESP:[birth year]   AU#:[id number]   BWL#:[id number]  Oncology follow up note   Subjective: Pt is clinically stable, pain overall controlled, she is able to sit at edge of bed for short period of time, but has not been able to walk, or even to try. She does not seem to be motivated to participate PT. No fever, bleeding or other new symptoms.   Objective:  Vitals:   09/03/18 1331 09/03/18 2110  BP: 96/68 139/83  Pulse: (!) 102 (!) 114  Resp: 18 20  Temp: 98.3 F (36.8 C) 98.7 F (37.1 C)  SpO2: 98% 96%    Body mass index is 23.02 kg/m.  Intake/Output Summary (Last 24 hours) at 09/03/2018 2225 Last data filed at 09/03/2018 1803 Gross per 24 hour  Intake 720 ml  Output 500 ml  Net 220 ml     Sclerae unicteric  Oropharynx clear  No peripheral adenopathy  Lungs clear -- no rales or rhonchi  Heart regular rate and rhythm  Abdomen benign  Neuro nonfocal   CBG (last 3)  Recent Labs    09/03/18 0730 09/03/18 1126 09/03/18 1625  GLUCAP 193* 172* 182*     Labs:  Lab Results  Component Value Date   WBC 1.8 (L) 09/03/2018   HGB 11.6 (L) 09/03/2018   HCT 35.3 (L) 09/03/2018   MCV 87.8 09/03/2018   PLT 42 (L) 09/03/2018   NEUTROABS 1.7 09/03/2018   CMP Latest Ref Rng & Units 09/03/2018 08/30/2018 08/29/2018  Glucose 70 - 99 mg/dL 217(H) 243(H) 260(H)  BUN 8 - 23 mg/dL 25(H) 27(H) 30(H)  Creatinine 0.44 - 1.00 mg/dL <0.30(L) 0.33(L) 0.35(L)  Sodium 135 - 145 mmol/L 133(L) 135 136  Potassium 3.5 - 5.1 mmol/L 4.2 4.7 4.2  Chloride 98 - 111 mmol/L 101 102 103  CO2 22 - 32 mmol/L '25 25 24  ' Calcium 8.9 - 10.3 mg/dL 7.9(L) 8.1(L) 7.8(L)  Total Protein 6.5 - 8.1 g/dL - - -  Total Bilirubin 0.3 - 1.2 mg/dL - - -  Alkaline Phos 38 - 126 U/L - - -  AST 15 - 41 U/L - - -  ALT 0 - 44 U/L - - -     Urine Studies No results for input(s): UHGB, CRYS in the last 72 hours.  Invalid input(s): UACOL, UAPR, USPG, UPH, UTP, UGL, UKET, UBIL, UNIT, UROB, ULEU, UEPI, UWBC,  [name], [place]  Basic Metabolic Panel: Recent Labs  Lab 08/28/18 0357 08/29/18 0340 08/30/18 0331 09/03/18 0349  NA 136 136 135 133*  K 4.3 4.2 4.7 4.2  CL 105 103 102 101  CO2 '22 24 25 25  ' GLUCOSE 256* 260* 243* 217*  BUN 32* 30* 27* 25*  CREATININE <0.30* 0.35* 0.33* <0.30*  CALCIUM 8.0* 7.8* 8.1* 7.9*  MG  --   --  2.2  --    GFR CrCl cannot be calculated (This lab value cannot be used to calculate CrCl because it is not a number: <0.30). Liver Function Tests: No results for input(s): AST, ALT, ALKPHOS, BILITOT, PROT, ALBUMIN in the last 168 hours. No results for input(s): LIPASE, AMYLASE in the last 168 hours. No results for input(s): AMMONIA in the last 168 hours. Coagulation profile Recent Labs  Lab 08/29/18 0340  INR 1.03    CBC: Recent Labs  Lab 08/29/18 0340 08/30/18 0331 09/01/18 0518 09/02/18 0455 09/03/18 0349  WBC 2.9* 3.0* 2.8* 1.8* 1.8*  NEUTROABS  --   --   --   --  1.7  HGB 10.8* 11.8* 11.8* 11.7* 11.6*  HCT 34.0* 37.7 37.3 36.4 35.3*  MCV 91.9 90.2 92.1 90.1 87.8  PLT 68*  68* 61* 62* 46* 42*   Cardiac Enzymes: No results for input(s): CKTOTAL, CKMB, CKMBINDEX, TROPONINI in the last 168 hours. BNP: Invalid input(s): POCBNP CBG: Recent Labs  Lab 09/02/18 1610 09/02/18 2130 09/03/18 0730 09/03/18 1126 09/03/18 1625  GLUCAP 214* 198* 193* 172* 182*   D-Dimer No results for input(s): DDIMER in the last 72 hours. Hgb A1c No results for input(s): HGBA1C in the last 72 hours. Lipid Profile No results for input(s): CHOL, HDL, LDLCALC, TRIG, CHOLHDL, LDLDIRECT in the last 72 hours. Thyroid function studies No results for input(s): TSH, T4TOTAL, T3FREE, THYROIDAB in the last 72 hours.  Invalid input(s): FREET3 Anemia work up No results for input(s): VITAMINB12, FOLATE, FERRITIN, TIBC, IRON, RETICCTPCT in the last 72 hours. Microbiology No results found for this or any previous visit (from the past 240  hour(s)).    Studies:  No results found.  Assessment: 63 y.o. 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 Pancytopenia, secondary to diffuse bone mets and radiation, ruled out DIC  4. hyperglycemia, secondary to steroids  5. History of SVT 6. Deconditioning  7. Depression    Plan:  -lab reviewed, plt 42K today, due to her thrombocytopenia, will hold Ibrance today, OK to restart if plt>50K  -continue letrozole  -she is currently on MS contin 32m q8h, oxycodone 193mevery 4 hours as needed, gabapentin 100 mg at bedtime, Flexeril as needed, and the Cymbalta, she used iv dilaudid 1-2 times every 1-2 days, not using oxycodone every day  -her dexa has reduced to 28m58m12h  -she seems to be depressed, not motivated to participate PT, I discussed with Dr. PurHerbert Moorsout adding SSRI, she is on Wellbutrin -discussed with case management, about her disposition. Plan to have family meeting tomorrow      YanTruitt MerleD 09/03/2018

## 2018-09-03 NOTE — Progress Notes (Signed)
Ms Sallee Provencal has been offered the opportunity to get out of bed and also walk. She refuses to get out of bed, She has a poor appetite

## 2018-09-04 LAB — GLUCOSE, CAPILLARY
GLUCOSE-CAPILLARY: 196 mg/dL — AB (ref 70–99)
GLUCOSE-CAPILLARY: 153 mg/dL — AB (ref 70–99)
Glucose-Capillary: 161 mg/dL — ABNORMAL HIGH (ref 70–99)
Glucose-Capillary: 233 mg/dL — ABNORMAL HIGH (ref 70–99)
Glucose-Capillary: 168 mg/dL — ABNORMAL HIGH (ref 70–99)

## 2018-09-04 MED ORDER — DULOXETINE HCL 60 MG PO CPEP
60.0000 mg | ORAL_CAPSULE | Freq: Every day | ORAL | Status: DC
  Administered 2018-09-04 – 2018-09-13 (×6): 60 mg via ORAL
  Filled 2018-09-04 (×7): qty 1

## 2018-09-04 NOTE — Progress Notes (Signed)
CSW sent pt's information (including FL2) to various SNFs in the state at her request- pt plan to DC home as there is not yet a payor source for any facility placement. Pt has pending Medicaid and is hoping to pursue facilities once Medicaid active.  Sharren Bridge, MSW, LCSW Clinical Social Work 09/04/2018 [phone]

## 2018-09-04 NOTE — NC FL2 (Signed)
[place] LEVEL OF CARE SCREENING TOOL     IDENTIFICATION  Patient Name: Kristina Mcfarland Birthdate: Jan 09, 1956 Sex: female Admission Date (Current Location): 08/19/2018  [facility] and [place] Number:  Herbalist and Address:  [facility],  [place] [address], [place]      Provider Number: [id number]  Attending Physician Name and Address:  Cristy Folks, MD  Relative Name and Phone Number:       Current Level of Care: Hospital Recommended Level of Care: [place] Prior Approval Number:    Date Approved/Denied:   PASRR Number: [id number]  Discharge Plan: Home    Current Diagnoses: Patient Active Problem List   Diagnosis Date Noted  . Steroid-induced hyperglycemia 08/31/2018  . Pancytopenia ([place]) 08/31/2018  . Metastatic breast cancer ([place])   . Intractable back pain 08/20/2018  . Weakness of both lower extremities 08/20/2018  . Palliative care by specialist   . Goals of care, counseling/discussion   . Cancer associated pain   . Constipation   . Bone metastasis ([place]) 08/08/2018  . Paroxysmal SVT (supraventricular tachycardia) ([place]) 08/08/2018  . LFT elevation 08/08/2018  . Back pain 08/08/2018    Orientation RESPIRATION BLADDER Height & Weight     Self, Time, Situation, Place  Normal Continent Weight: 125 lb 14.1 oz (57.1 kg) Height:  5\' 2"  (157.5 cm)  BEHAVIORAL SYMPTOMS/MOOD NEUROLOGICAL BOWEL NUTRITION STATUS      Continent Diet(low sodium heart healthy)  AMBULATORY STATUS COMMUNICATION OF NEEDS Skin   Extensive Assist Verbally Normal                       Personal Care Assistance Level of Assistance  Bathing, Feeding, Dressing Bathing Assistance: Limited assistance Feeding assistance: Independent Dressing Assistance: Maximum assistance     Functional Limitations Info  Sight, Hearing, Speech Sight Info: Adequate Hearing Info: Adequate Speech Info: Adequate    SPECIAL CARE FACTORS  FREQUENCY                       Contractures Contractures Info: Not present    Additional Factors Info  Code Status, Allergies Code Status Info: full code Allergies Info: Oxycodone-acetaminophen, Other           Current Medications (09/04/2018):  This is the current hospital active medication list Current Facility-Administered Medications  Medication Dose Route Frequency Provider Last Rate Last Dose  . acetaminophen (TYLENOL) tablet 650 mg  650 mg Oral Q6H PRN Johnson-Pitts, Endia, MD       Or  . acetaminophen (TYLENOL) suppository 650 mg  650 mg Rectal Q6H PRN Johnson-Pitts, Endia, MD      . bisacodyl (DULCOLAX) EC tablet 5 mg  5 mg Oral Daily PRN Johnson-Pitts, Endia, MD      . bisacodyl (DULCOLAX) suppository 10 mg  10 mg Rectal Once Irene Pap N, DO      . buPROPion [facility] SR) 12 hr tablet 150 mg  150 mg Oral Daily Purohit, Shrey C, MD   150 mg at 09/04/18 1033  . cyclobenzaprine (FLEXERIL) tablet 10 mg  10 mg Oral TID PRN Purohit, Shrey C, MD      . dexamethasone (DECADRON) tablet 4 mg  4 mg Oral Q12H Paticia Stack, MD   4 mg at 09/04/18 1033  . DULoxetine (CYMBALTA) DR capsule 60 mg  60 mg Oral Daily Purohit, Shrey C, MD   60 mg at 09/04/18 1033  . famotidine (  PEPCID) tablet 20 mg  20 mg Oral BID Johnson-Pitts, Endia, MD   20 mg at 09/04/18 1033  . feeding supplement (ENSURE ENLIVE) (ENSURE ENLIVE) liquid 237 mL  237 mL Oral BID BM Johnson-Pitts, Endia, MD   237 mL at 09/04/18 1034  . fluconazole (DIFLUCAN) tablet 200 mg  200 mg Oral Daily Polly Cobia, RPH   200 mg at 09/04/18 1034  . gabapentin (NEURONTIN) capsule 100 mg  100 mg Oral QHS Johnson-Pitts, Endia, MD   100 mg at 09/03/18 2229  . HYDROmorphone (DILAUDID) injection 0.5 mg  0.5 mg Intravenous Q4H PRN Irene Pap N, DO   0.5 mg at 09/02/18 1440  . insulin aspart (novoLOG) injection 0-9 Units  0-9 Units Subcutaneous TID WC Paticia Stack, MD   3 Units at 09/04/18 1222  . letrozole [facility]) tablet 2.5 mg   2.5 mg Oral Daily Truitt Merle, MD   2.5 mg at 09/04/18 1032  . lidocaine (LIDODERM) 5 % 1 patch  1 patch Transdermal Q24H Irene Pap N, DO   1 patch at 09/03/18 1626  . magic mouthwash w/lidocaine  10 mL Oral TID Irene Pap N, DO   10 mL at 09/04/18 1109  . morphine (MS CONTIN) 12 hr tablet 15 mg  15 mg Oral Q8H Purohit, Shrey C, MD   15 mg at 09/04/18 1521  . ondansetron (ZOFRAN) tablet 4 mg  4 mg Oral Q6H PRN Johnson-Pitts, Endia, MD   4 mg at 08/27/18 1018   Or  . ondansetron (ZOFRAN) injection 4 mg  4 mg Intravenous Q6H PRN Johnson-Pitts, Endia, MD   4 mg at 08/23/18 1525  . oxyCODONE (Oxy IR/ROXICODONE) immediate release tablet 15 mg  15 mg Oral Q4H PRN Johnson-Pitts, Endia, MD   15 mg at 09/01/18 2032  . polyethylene glycol (MIRALAX / GLYCOLAX) packet 17 g  17 g Oral BID Johnson-Pitts, Endia, MD   17 g at 09/03/18 2231  . senna-docusate (Senokot-S) tablet 2 tablet  2 tablet Oral BID Irene Pap N, DO   2 tablet at 09/04/18 1034  . sodium phosphate (FLEET) 7-19 GM/118ML enema 1 enema  1 enema Rectal Once PRN Johnson-Pitts, Endia, MD         Discharge Medications: Please see discharge summary for a list of discharge medications.  Relevant Imaging Results:  Relevant Lab Results:   Additional Information SS# [id number]  Nila Nephew, LCSW

## 2018-09-04 NOTE — Progress Notes (Addendum)
Daily Progress Note   Patient Name: Kristina Mcfarland       Date: 09/04/2018 DOB: [birth year]  Age: 63 y.o. MRN#: [id number] Attending Physician: Cristy Folks, MD Primary Care Physician: Willeen Niece, [place] Admit Date: 08/19/2018  Reason for Consultation/Follow-up: Establishing goals of care  Subjective:  patient is awake alert Resting in bed Family members including the patient's son are at the bedside.   Length of Stay: 15  Current Medications: Scheduled Meds:  . bisacodyl  10 mg Rectal Once  . buPROPion  150 mg Oral Daily  . dexamethasone  4 mg Oral Q12H  . DULoxetine  60 mg Oral Daily  . famotidine  20 mg Oral BID  . feeding supplement (ENSURE ENLIVE)  237 mL Oral BID BM  . fluconazole  200 mg Oral Daily  . gabapentin  100 mg Oral QHS  . insulin aspart  0-9 Units Subcutaneous TID WC  . letrozole  2.5 mg Oral Daily  . lidocaine  1 patch Transdermal Q24H  . magic mouthwash w/lidocaine  10 mL Oral TID  . morphine  15 mg Oral Q8H  . polyethylene glycol  17 g Oral BID  . senna-docusate  2 tablet Oral BID    Continuous Infusions:   PRN Meds: acetaminophen **OR** acetaminophen, bisacodyl, cyclobenzaprine, HYDROmorphone (DILAUDID) injection, ondansetron **OR** ondansetron (ZOFRAN) IV, oxyCODONE, sodium phosphate  Physical Exam         Awake alert Tearful at times Regular work of breathing S 1 S 2  No edema Non focal  Vital Signs: BP 101/75 (BP Location: Left Arm)   Pulse (!) 106   Temp 98.2 F (36.8 C) (Oral)   Resp 16   Ht 5\' 2"  (1.575 m)   Wt 57.1 kg   SpO2 93%   BMI 23.02 kg/m  SpO2: SpO2: 93 % O2 Device: O2 Device: Room Air O2 Flow Rate: O2 Flow Rate (L/min): 2 L/min  Intake/output summary:   Intake/Output Summary (Last 24 hours) at 09/04/2018 1744 Last  data filed at 09/04/2018 1015 Gross per 24 hour  Intake 480 ml  Output -  Net 480 ml   LBM: Last BM Date: 09/01/18 Baseline Weight: Weight: 54.4 kg Most recent weight: Weight: 57.1 kg      PPS 40% Palliative Assessment/Data:    Flowsheet Rows  Most Recent Value  Intake Tab  Referral Department  Hospitalist  Unit at Time of Referral  Oncology Unit  Palliative Care Primary Diagnosis  Cancer  Date Notified  08/20/18  Palliative Care Type  Return patient Palliative Care  Reason for referral  Non-pain Symptom, Clarify Goals of Care  Date of Admission  08/20/18  Date first seen by Palliative Care  08/21/18  # of days Palliative referral response time  1 Day(s)  # of days IP prior to Palliative referral  0  Clinical Assessment  Psychosocial & Spiritual Assessment  Palliative Care Outcomes      Patient Active Problem List   Diagnosis Date Noted  . Steroid-induced hyperglycemia 08/31/2018  . Pancytopenia ([place]) 08/31/2018  . Metastatic breast cancer ([place])   . Intractable back pain 08/20/2018  . Weakness of both lower extremities 08/20/2018  . Palliative care by specialist   . Goals of care, counseling/discussion   . Cancer associated pain   . Constipation   . Bone metastasis ([place]) 08/08/2018  . Paroxysmal SVT (supraventricular tachycardia) ([place]) 08/08/2018  . LFT elevation 08/08/2018  . Back pain 08/08/2018    Palliative Care Assessment & Plan   Patient Profile:    Assessment:  63 year old woman PMH relevant for SVT and metastatic breast cancer with extensive involvement of the cervical, thoracic and lumbar spine, diagnosed late December 2019, currently receiving chemotherapy, presented with worsening back pain, leg spasms, inability to ambulate.   Recommendations/Plan:   continue current pain and non pain symptom management medications.  Family meeting:  A family meeting was organized by care management. It was attended by the patient's oncologist, [facility] MD as  well as the undersigned. Discussed about safe disposition options. Offered choices and reviewed the patient's overall mode of care. Her goals at this point, are to continue with current anti cancer agents, physical therapy attempts.  All of the patient and family's concerns addressed to their satisfaction.   Goals of Care and Additional Recommendations:  Limitations on Scope of Treatment: Full Scope Treatment  Code Status:    Code Status Orders  (From admission, onward)         Start     Ordered   08/20/18 0148  Full code  Continuous     08/20/18 0147        Code Status History    Date Active Date Inactive Code Status Order ID Comments User Context   08/08/2018 1307 08/18/2018 1728 Full Code [id number]  Reubin Milan, MD Inpatient       Prognosis:   Unable to determine  Discharge Planning:  Home with [place] was discussed with  Patient, son, and other colleagues as listed above.   Thank you for allowing the Palliative Medicine Team to assist in the care of this patient.   Time In: 12.30 Time Out: 1300 Total Time 30 Prolonged Time Billed  no       Greater than 50%  of this time was spent counseling and coordinating care related to the above assessment and plan.  Loistine Chance, MD [phone] Please contact Palliative Medicine Team phone at [phone] for questions and concerns.

## 2018-09-04 NOTE — Care Management (Signed)
This CM met with pt, father, son, CSW, Materials engineer, PM MD and attending at bedside for disposition planning meeting. At this time it was decided that pt would like to continue cancer treatment and go home with home health services. Son voiced needs for hospital bed, wheelchair and 3in1 for home. This CM contacted [facility] for pt to be considered for charity care. Pt will be screened for charity care and will need orders for HHPT/OT/RN/Aide/CSW at dc. Marney Doctor RN,BSN [phone]

## 2018-09-04 NOTE — Progress Notes (Signed)
Kristina Mcfarland   WUG:[birth year]   WT#:[id number]   JZP#:[id number]  Oncology follow up note   Subjective: We had family meeting with pt, her son and father-in-law at noon today regarding her discharge plan. She is clinically stable, pain controlled, no other new symptoms.   Objective:  Vitals:   09/04/18 0654 09/04/18 1351  BP: 110/78 101/75  Pulse: (!) 103 (!) 106  Resp: 16 16  Temp: 98 F (36.7 C) 98.2 F (36.8 C)  SpO2: 95% 93%    Body mass index is 23.02 kg/m.  Intake/Output Summary (Last 24 hours) at 09/04/2018 1809 Last data filed at 09/04/2018 1015 Gross per 24 hour  Intake 240 ml  Output -  Net 240 ml     Sclerae unicteric  Oropharynx clear  No peripheral adenopathy  Lungs clear -- no rales or rhonchi  Heart regular rate and rhythm  Abdomen benign  Neuro nonfocal   CBG (last 3)  Recent Labs    09/04/18 0812 09/04/18 1155 09/04/18 1713  GLUCAP 161* 233* 168*     Labs:  Lab Results  Component Value Date   WBC 1.8 (L) 09/03/2018   HGB 11.6 (L) 09/03/2018   HCT 35.3 (L) 09/03/2018   MCV 87.8 09/03/2018   PLT 42 (L) 09/03/2018   NEUTROABS 1.7 09/03/2018   CMP Latest Ref Rng & Units 09/03/2018 08/30/2018 08/29/2018  Glucose 70 - 99 mg/dL 217(H) 243(H) 260(H)  BUN 8 - 23 mg/dL 25(H) 27(H) 30(H)  Creatinine 0.44 - 1.00 mg/dL <0.30(L) 0.33(L) 0.35(L)  Sodium 135 - 145 mmol/L 133(L) 135 136  Potassium 3.5 - 5.1 mmol/L 4.2 4.7 4.2  Chloride 98 - 111 mmol/L 101 102 103  CO2 22 - 32 mmol/L '25 25 24  ' Calcium 8.9 - 10.3 mg/dL 7.9(L) 8.1(L) 7.8(L)  Total Protein 6.5 - 8.1 g/dL - - -  Total Bilirubin 0.3 - 1.2 mg/dL - - -  Alkaline Phos 38 - 126 U/L - - -  AST 15 - 41 U/L - - -  ALT 0 - 44 U/L - - -     Urine Studies No results for input(s): UHGB, CRYS in the last 72 hours.  Invalid input(s): UACOL, UAPR, USPG, UPH, UTP, UGL, UKET, UBIL, UNIT, UROB, ULEU, UEPI, UWBC, URBC, UBAC, CAST, [place], [place]  Basic Metabolic Panel: Recent Labs  Lab 08/29/18 0340  08/30/18 0331 09/03/18 0349  NA 136 135 133*  K 4.2 4.7 4.2  CL 103 102 101  CO2 '24 25 25  ' GLUCOSE 260* 243* 217*  BUN 30* 27* 25*  CREATININE 0.35* 0.33* <0.30*  CALCIUM 7.8* 8.1* 7.9*  MG  --  2.2  --    GFR CrCl cannot be calculated (This lab value cannot be used to calculate CrCl because it is not a number: <0.30). Liver Function Tests: No results for input(s): AST, ALT, ALKPHOS, BILITOT, PROT, ALBUMIN in the last 168 hours. No results for input(s): LIPASE, AMYLASE in the last 168 hours. No results for input(s): AMMONIA in the last 168 hours. Coagulation profile Recent Labs  Lab 08/29/18 0340  INR 1.03    CBC: Recent Labs  Lab 08/29/18 0340 08/30/18 0331 09/01/18 0518 09/02/18 0455 09/03/18 0349  WBC 2.9* 3.0* 2.8* 1.8* 1.8*  NEUTROABS  --   --   --   --  1.7  HGB 10.8* 11.8* 11.8* 11.7* 11.6*  HCT 34.0* 37.7 37.3 36.4 35.3*  MCV 91.9 90.2 92.1 90.1 87.8  PLT 68*  68* 61* 62* 46*  42*   Cardiac Enzymes: No results for input(s): CKTOTAL, CKMB, CKMBINDEX, TROPONINI in the last 168 hours. BNP: Invalid input(s): POCBNP CBG: Recent Labs  Lab 09/03/18 2112 09/04/18 0659 09/04/18 0812 09/04/18 1155 09/04/18 1713  GLUCAP 203* 196* 161* 233* 168*   D-Dimer No results for input(s): DDIMER in the last 72 hours. Hgb A1c No results for input(s): HGBA1C in the last 72 hours. Lipid Profile No results for input(s): CHOL, HDL, LDLCALC, TRIG, CHOLHDL, LDLDIRECT in the last 72 hours. Thyroid function studies No results for input(s): TSH, T4TOTAL, T3FREE, THYROIDAB in the last 72 hours.  Invalid input(s): FREET3 Anemia work up No results for input(s): VITAMINB12, FOLATE, FERRITIN, TIBC, IRON, RETICCTPCT in the last 72 hours. Microbiology No results found for this or any previous visit (from the past 240 hour(s)).    Studies:  No results found.  Assessment: 63 y.o. 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 Pancytopenia, secondary to diffuse bone mets and radiation, ruled out DIC  4. hyperglycemia, secondary to steroids  5. History of SVT 6. Deconditioning  7. Depression    Plan:  -continue letrozole and hold on Ibrance for now, due to her thrombocytopenia  -during the family meeting, I reviewed her diagnosis, staging, and treatment plan.  We discussed her metastatic breast cancer is not curable, but the treatment is probably going to prolong her life, and improve her symptoms, especially pain.  She is willing to continue.  She is probably not a candidate for hospice yet at this stage. Her son had bad experience with hospice before.  -Her Medicaid is still pending, so she is not able to be discharged to skilled nursing home.  However our Education officer, museum will start contacting some rehabs, so she may be able to go when she has Medicaid. -Her son plans to take her home in a few day when her new apartment is ready, our SW will help to arrange a hospital bed, beside comert, etc  -We encourage her to continue PT -I will set up her office visit in 2 weeks  -all questions were answered.     Truitt Merle, MD 09/04/2018

## 2018-09-04 NOTE — Progress Notes (Signed)
PT Cancellation Note  Patient Details Name: Kristina Mcfarland MRN: [id number] DOB: 11-26-1955   Cancelled Treatment:     Family meeting today at 12:30.  Checked back later, pt soundly sleeping.     Rica Koyanagi  PTA Acute  Rehabilitation Services Pager      [phone] Office      [phone]

## 2018-09-04 NOTE — Progress Notes (Signed)
PROGRESS NOTE    Kristina Mcfarland  TZG:[id number] DOB: 07-Nov-1955 DOA: 08/19/2018 PCP: Willeen Niece, PA   Brief Narrative:  63 year old woman PMH relevant for SVT and metastatic breast cancer with extensive involvement of the cervical, thoracic and lumbar spine, diagnosed late December 2019, currently receiving chemotherapy, presented with worsening back pain, leg spasms, inability to ambulate.    Assessment & Plan:   Principal Problem:   Intractable back pain Active Problems:   Bone metastasis (HCC)   Cancer associated pain   Weakness of both lower extremities   Metastatic breast cancer (HCC)   Steroid-induced hyperglycemia   Pancytopenia (HCC)   #) Widely metastatic breast cancer/back pain: Patient mainly has extensive involvement of the cervical, thoracic, lumbar spine on MRI with no evidence of cord compression or embarrassment at this time.  Unfortunately her inability to participate in physical therapy appears to be a combination of both mood and pain. -Plan for family meeting today with oncology, palliative care -Continue MS Contin to every 8 hours -Continue gabapentin 100 mg nightly - Continue dexamethasone 8 mg every 8 hours -Increase duloxetine 60 mg daily -Continue bupropion 300 mg daily -Continue cyclobenzaprine 10 mg 3 times daily -continue PRN oxycodone -Palliative care following, appreciate recommendations -Oncology following  #) Thrombocytopenia/leukopenia: Possibly secondary to bone marrow infiltration  #) History of SVT: This is not an active issue at this time  Fluids: Tolerating p.o. Electrolytes: Monitor and supplement Nutrition: Regular diet   Prophylaxis:Thrombocytopenia  Disposition: Pending ability to ambulate, patient is self-pay and so cannot afford SNF  Full code   Consultants:   Oncology  Palliative care  Procedures:   none  Antimicrobials:   none    Subjective: This morning patient reports that she is doing well.  She is  eager for the family meeting.  She denies any nausea, vomiting, diarrhea.  Objective: Vitals:   09/03/18 0607 09/03/18 1331 09/03/18 2110 09/04/18 0654  BP: 96/74 96/68 139/83 110/78  Pulse: (!) 109 (!) 102 (!) 114 (!) 103  Resp: 17 18 20 16   Temp: 99.5 F (37.5 C) 98.3 F (36.8 C) 98.7 F (37.1 C) 98 F (36.7 C)  TempSrc: Oral Oral Oral Oral  SpO2: 97% 98% 96% 95%  Weight:      Height:        Intake/Output Summary (Last 24 hours) at 09/04/2018 1014 Last data filed at 09/03/2018 1803 Gross per 24 hour  Intake 480 ml  Output 300 ml  Net 180 ml   Filed Weights   08/19/18 1848 08/20/18 0437  Weight: 54.4 kg 57.1 kg    Examination:  General exam: Appears calm and comfortable  Respiratory system: Clear to auscultation. Respiratory effort normal. Cardiovascular system: Regular rate and rhythm, no murmurs Gastrointestinal system: Abdomen is nondistended, soft and nontender. No organomegaly or masses felt. Normal bowel sounds heard. Central nervous system: Alert and oriented.  Lower extremity strength is 4+ out of 5 Extremities: No lower extremity edema Skin: No rashes over visible skin Psychiatry: Judgement and insight appear normal. Mood & affect appropriate.     Data Reviewed: I have personally reviewed following labs and imaging studies  CBC: Recent Labs  Lab 08/29/18 0340 08/30/18 0331 09/01/18 0518 09/02/18 0455 09/03/18 0349  WBC 2.9* 3.0* 2.8* 1.8* 1.8*  NEUTROABS  --   --   --   --  1.7  HGB 10.8* 11.8* 11.8* 11.7* 11.6*  HCT 34.0* 37.7 37.3 36.4 35.3*  MCV 91.9 90.2 92.1 90.1 87.8  PLT 68*  68* 61* 62* 46* 42*   Basic Metabolic Panel: Recent Labs  Lab 08/29/18 0340 08/30/18 0331 09/03/18 0349  NA 136 135 133*  K 4.2 4.7 4.2  CL 103 102 101  CO2 24 25 25   GLUCOSE 260* 243* 217*  BUN 30* 27* 25*  CREATININE 0.35* 0.33* <0.30*  CALCIUM 7.8* 8.1* 7.9*  MG  --  2.2  --    GFR: CrCl cannot be calculated (This lab value cannot be used to  calculate CrCl because it is not a number: <0.30). Liver Function Tests: No results for input(s): AST, ALT, ALKPHOS, BILITOT, PROT, ALBUMIN in the last 168 hours. No results for input(s): LIPASE, AMYLASE in the last 168 hours. No results for input(s): AMMONIA in the last 168 hours. Coagulation Profile: Recent Labs  Lab 08/29/18 0340  INR 1.03   Cardiac Enzymes: No results for input(s): CKTOTAL, CKMB, CKMBINDEX, TROPONINI in the last 168 hours. BNP (last 3 results) No results for input(s): PROBNP in the last 8760 hours. HbA1C: No results for input(s): HGBA1C in the last 72 hours. CBG: Recent Labs  Lab 09/03/18 1126 09/03/18 1625 09/03/18 2112 09/04/18 0659 09/04/18 0812  GLUCAP 172* 182* 203* 196* 161*   Lipid Profile: No results for input(s): CHOL, HDL, LDLCALC, TRIG, CHOLHDL, LDLDIRECT in the last 72 hours. Thyroid Function Tests: No results for input(s): TSH, T4TOTAL, FREET4, T3FREE, THYROIDAB in the last 72 hours. Anemia Panel: No results for input(s): VITAMINB12, FOLATE, FERRITIN, TIBC, IRON, RETICCTPCT in the last 72 hours. Sepsis Labs: No results for input(s): PROCALCITON, LATICACIDVEN in the last 168 hours.  No results found for this or any previous visit (from the past 240 hour(s)).       Radiology Studies: No results found.      Scheduled Meds: . bisacodyl  10 mg Rectal Once  . buPROPion  150 mg Oral Daily  . dexamethasone  4 mg Oral Q12H  . DULoxetine  60 mg Oral Daily  . famotidine  20 mg Oral BID  . feeding supplement (ENSURE ENLIVE)  237 mL Oral BID BM  . fluconazole  200 mg Oral Daily  . gabapentin  100 mg Oral QHS  . insulin aspart  0-9 Units Subcutaneous TID WC  . letrozole  2.5 mg Oral Daily  . lidocaine  1 patch Transdermal Q24H  . magic mouthwash w/lidocaine  10 mL Oral TID  . morphine  15 mg Oral Q8H  . polyethylene glycol  17 g Oral BID  . senna-docusate  2 tablet Oral BID   Continuous Infusions:    LOS: 15 days    Time  spent: [place], MD [facility]  If 7PM-7AM, please contact night-coverage [email or web address] Password [email or web address] 09/04/2018, 10:14 AM

## 2018-09-05 LAB — CBC WITH DIFFERENTIAL/PLATELET
Abs Immature Granulocytes: 0.04 10*3/uL (ref 0.00–0.07)
Basophils Absolute: 0 K/uL (ref 0.0–0.1)
Basophils Relative: 1 %
Eosinophils Absolute: 0 K/uL (ref 0.0–0.5)
Eosinophils Relative: 0 %
HCT: 33.5 % — ABNORMAL LOW (ref 36.0–46.0)
Hemoglobin: 10.7 g/dL — ABNORMAL LOW (ref 12.0–15.0)
Immature Granulocytes: 4 %
Lymphocytes Relative: 5 %
Lymphs Abs: 0.1 10*3/uL — ABNORMAL LOW (ref 0.7–4.0)
MCH: 29 pg (ref 26.0–34.0)
MCHC: 31.9 g/dL (ref 30.0–36.0)
MCV: 90.8 fL (ref 80.0–100.0)
Monocytes Absolute: 0 10*3/uL — ABNORMAL LOW (ref 0.1–1.0)
Monocytes Relative: 4 %
Neutro Abs: 0.9 10*3/uL — ABNORMAL LOW (ref 1.7–7.7)
Neutrophils Relative %: 86 %
Platelets: 38 10*3/uL — ABNORMAL LOW (ref 150–400)
RBC: 3.69 MIL/uL — ABNORMAL LOW (ref 3.87–5.11)
RDW: 19 % — ABNORMAL HIGH (ref 11.5–15.5)
WBC: 1 10*3/uL — CL (ref 4.0–10.5)
nRBC: 1.9 % — ABNORMAL HIGH (ref 0.0–0.2)

## 2018-09-05 LAB — GLUCOSE, CAPILLARY
Glucose-Capillary: 225 mg/dL — ABNORMAL HIGH (ref 70–99)
Glucose-Capillary: 199 mg/dL — ABNORMAL HIGH (ref 70–99)
Glucose-Capillary: 200 mg/dL — ABNORMAL HIGH (ref 70–99)
Glucose-Capillary: 127 mg/dL — ABNORMAL HIGH (ref 70–99)

## 2018-09-05 NOTE — Care Management (Signed)
    Durable Medical Equipment  (From admission, onward)         Start     Ordered   09/05/18 1058  For home use only DME Hospital bed  Once    Question Answer Comment  Patient has (list medical condition): breast cancer with bone mets   The above medical condition requires: Patient requires the ability to reposition frequently   Head must be elevated greater than: 30 degrees   Bed type Semi-electric      09/05/18 1058   09/05/18 1057  For home use only DME 3 n 1  Once     09/05/18 1058   09/05/18 1057  For home use only DME lightweight manual wheelchair with seat cushion  Once    Comments:  Patient suffers from bone mets which impairs their ability to perform daily activities like walking in the home.  A walker will not resolve  issue with performing activities of daily living. A wheelchair will allow patient to safely perform daily activities. Patient is not able to propel themselves in the home using a standard weight wheelchair due to weakness. Patient can self propel in the lightweight wheelchair.  Accessories: elevating leg rests (ELRs), wheel locks, extensions and anti-tippers.   09/05/18 1058

## 2018-09-05 NOTE — Progress Notes (Signed)
PROGRESS NOTE    Kristina Mcfarland  QIH:[id number] DOB: 1956/01/31 DOA: 08/19/2018 PCP: Willeen Niece, PA   Brief Narrative:  63 year old woman PMH relevant for SVT and metastatic breast cancer with extensive involvement of the cervical, thoracic and lumbar spine, diagnosed late December 2019, currently receiving chemotherapy, presented with worsening back pain, leg spasms, inability to ambulate.    Assessment & Plan:   Principal Problem:   Intractable back pain Active Problems:   Bone metastasis (HCC)   Cancer associated pain   Weakness of both lower extremities   Metastatic breast cancer (HCC)   Steroid-induced hyperglycemia   Pancytopenia (HCC)   #) Widely metastatic breast cancer/back pain: Patient mainly has extensive involvement of the cervical, thoracic, lumbar spine on MRI with no evidence of cord compression or embarrassment at this time.  Unfortunately her inability to participate in physical therapy appears to be a combination of both mood and pain. -Family meeting on 09/04/2018 it was discussed that the patient can return home while pending for Medicaid approval for skilled nursing facility placement. -Case management will attempt to get necessary durable medical equipment including wheelchair, hospital bed [facility] -Continue MS Contin to every 8 hours -Continue gabapentin 100 mg nightly - Continue dexamethasone 4 mg every 12 hours -Continue duloxetine 60 mg daily -Continue bupropion 300 mg daily -continue PRN oxycodone -Palliative care following, appreciate recommendations -Oncology following, they have started patient on letrozole  #) Thrombocytopenia/leukopenia: Possibly secondary to bone marrow infiltration.  #) History of SVT: This is not an active issue at this time  Fluids: Tolerating p.o. Electrolytes: Monitor and supplement Nutrition: Regular diet   Prophylaxis:Thrombocytopenia  Disposition: Pending ability to ambulate, patient is self-pay  and so cannot afford SNF  Full code   Consultants:   Oncology  Palliative care  Procedures:   none  Antimicrobials:   none    Subjective: This morning patient reports that she is doing well.  When discussing about her being discharged home patient starts becoming tearful and crying reporting that she is doing all she can.  She again shuts down when discussing her symptoms and does not want to further address it.  Objective: Vitals:   09/04/18 0654 09/04/18 1351 09/04/18 2058 09/05/18 0647  BP: 110/78 101/75 102/86 110/66  Pulse: (!) 103 (!) 106 (!) 113 (!) 108  Resp: 16 16 16 20   Temp: 98 F (36.7 C) 98.2 F (36.8 C) 98.1 F (36.7 C) 98.1 F (36.7 C)  TempSrc: Oral Oral Oral Oral  SpO2: 95% 93% 93% 99%  Weight:      Height:       No intake or output data in the 24 hours ending 09/05/18 1030 Filed Weights   08/19/18 1848 08/20/18 0437  Weight: 54.4 kg 57.1 kg    Examination:  General exam: Appears calm and comfortable  Respiratory system: Clear to auscultation. Respiratory effort normal. Cardiovascular system: Regular rate and rhythm, no murmurs Gastrointestinal system: Abdomen is nondistended, soft and nontender. No organomegaly or masses felt. Normal bowel sounds heard. Central nervous system: Alert and oriented.  Lower extremity strength is 4+ out of 5 Extremities: No lower extremity edema Skin: No rashes over visible skin Psychiatry: Judgement and insight appear normal. Mood & affect appropriate.     Data Reviewed: I have personally reviewed following labs and imaging studies  CBC: Recent Labs  Lab 08/30/18 0331 09/01/18 0518 09/02/18 0455 09/03/18 0349 09/05/18 0425  WBC 3.0* 2.8* 1.8* 1.8* 1.0*  NEUTROABS  --   --   --  1.7 0.9*  HGB 11.8* 11.8* 11.7* 11.6* 10.7*  HCT 37.7 37.3 36.4 35.3* 33.5*  MCV 90.2 92.1 90.1 87.8 90.8  PLT 61* 62* 46* 42* 38*   Basic Metabolic Panel: Recent Labs  Lab 08/30/18 0331 09/03/18 0349  NA 135 133*    K 4.7 4.2  CL 102 101  CO2 25 25  GLUCOSE 243* 217*  BUN 27* 25*  CREATININE 0.33* <0.30*  CALCIUM 8.1* 7.9*  MG 2.2  --    GFR: CrCl cannot be calculated (This lab value cannot be used to calculate CrCl because it is not a number: <0.30). Liver Function Tests: No results for input(s): AST, ALT, ALKPHOS, BILITOT, PROT, ALBUMIN in the last 168 hours. No results for input(s): LIPASE, AMYLASE in the last 168 hours. No results for input(s): AMMONIA in the last 168 hours. Coagulation Profile: No results for input(s): INR, PROTIME in the last 168 hours. Cardiac Enzymes: No results for input(s): CKTOTAL, CKMB, CKMBINDEX, TROPONINI in the last 168 hours. BNP (last 3 results) No results for input(s): PROBNP in the last 8760 hours. HbA1C: No results for input(s): HGBA1C in the last 72 hours. CBG: Recent Labs  Lab 09/04/18 0812 09/04/18 1155 09/04/18 1713 09/04/18 2105 09/05/18 0753  GLUCAP 161* 233* 168* 153* 225*   Lipid Profile: No results for input(s): CHOL, HDL, LDLCALC, TRIG, CHOLHDL, LDLDIRECT in the last 72 hours. Thyroid Function Tests: No results for input(s): TSH, T4TOTAL, FREET4, T3FREE, THYROIDAB in the last 72 hours. Anemia Panel: No results for input(s): VITAMINB12, FOLATE, FERRITIN, TIBC, IRON, RETICCTPCT in the last 72 hours. Sepsis Labs: No results for input(s): PROCALCITON, LATICACIDVEN in the last 168 hours.  No results found for this or any previous visit (from the past 240 hour(s)).       Radiology Studies: No results found.      Scheduled Meds: . bisacodyl  10 mg Rectal Once  . buPROPion  150 mg Oral Daily  . dexamethasone  4 mg Oral Q12H  . DULoxetine  60 mg Oral Daily  . famotidine  20 mg Oral BID  . feeding supplement (ENSURE ENLIVE)  237 mL Oral BID BM  . gabapentin  100 mg Oral QHS  . insulin aspart  0-9 Units Subcutaneous TID WC  . letrozole  2.5 mg Oral Daily  . lidocaine  1 patch Transdermal Q24H  . magic mouthwash w/lidocaine   10 mL Oral TID  . morphine  15 mg Oral Q8H  . polyethylene glycol  17 g Oral BID  . senna-docusate  2 tablet Oral BID   Continuous Infusions:    LOS: 16 days    Time spent: [place], MD [facility]  If 7PM-7AM, please contact night-coverage [email or web address] Password [email or web address] 09/05/2018, 10:30 AM

## 2018-09-05 NOTE — Progress Notes (Signed)
Physical Therapy Treatment Patient Details Name: Kristina Mcfarland MRN: [id number] DOB: November 27, 1955 Today's Date: 09/05/2018    History of Present Illness  63 yo female with diagnosis of breast cancer stage IV admitted to ED on 1/6 for pain. Pt recently admitted on 12/26 for LBP with R leg radiations x 1 month. Pt also with L arm numbness. Imaging revealed cervical and thoracic spine pathologic fractures, and lesions to spine. Other notable PMH includes SVT, back surgery.      PT Comments    Pt awake on phone on arrival.  Pt stated she would like to try getting OOB but "not sure" and "I don't want to hurt". Pt did state she felt like she could have a BM.  So assisted OOB to [facility] with much effort.  General bed mobility comments: HOB elevated pt required MAX assist to transfer from supine to EOB.  Visibly uncomfortable and unable to offer much self assist with B LE's due to PAIN.  "Everywhere" .  General transfer comment: Unable to fully perform sit to stand as B LE buckle with MAX c/o back/hip pain.  Had to "[facility]" pt from front and pivot 1/4 turn from elevated bed to [facility] then again from [facility] to recliner.  Pt was unable to support her own weight and unable to offer enough assist to measure.  General Gait Details: unable to attempt gait due to poor self ability to transfer.  B LE buckle with PAIN.  Pt tearful, emotional.  Positioned in recliner with multiple pillows.  RN in room.  Per chart review, pt D/C to home with family.  Pt will need TOTAL CARE and pain management.   Follow Up Recommendations  SNF     Equipment Recommendations  Hospital bed;Wheelchair (measurements PT);Wheelchair cushion (measurements PT);3in1 (PT)    Recommendations for Other Services       Precautions / Restrictions Precautions Precautions: Fall Precaution Comments: Mets and pathologic fractues to C- and T-spine Restrictions Weight Bearing Restrictions: No    Mobility  Bed Mobility Overal bed mobility: Needs  Assistance Bed Mobility: Rolling;Supine to Sit;Sit to Supine Rolling: Max assist Sidelying to sit: Max assist;Total assist Supine to sit: Max assist;Total assist     General bed mobility comments: HOB elevated pt required MAX assist to transfer from supine to EOB.  Visibly uncomfortable and unable to offer much self assist with B LE's due to PAIN.  "Everywhere"   Transfers Overall transfer level: Needs assistance Equipment used: None Transfers: Sit to/from [employer] Transfers Sit to Stand: From elevated surface(unable to complete a true sit to stand as B LE buckle with pain) Stand pivot transfers: Total assist       General transfer comment: Unable to fully perform sit to stand as B LE buckle with MAX c/o back/hip pain.  Had to "[facility]" pt from front and pivot 1/4 turn from elevated bed to [facility] then again from [facility] to recliner.  Pt was unable to support her own weight and unable to offer enough assist to measure.    Ambulation/Gait             General Gait Details: unable to attempt gait due to poor self ability to transfer.  B LE buckle with PAIN.  Pt tearful, emotional.     Stairs             Wheelchair Mobility    Modified Rankin (Stroke Patients Only)       Balance  Cognition Arousal/Alertness: Awake/alert Behavior During Therapy: WFL for tasks assessed/performed Overall Cognitive Status: Within Functional Limits for tasks assessed                                 General Comments: pt alert and conversive.  Emotional, crying with pain and tearful about her inability to walk.        Exercises      General Comments        Pertinent Vitals/Pain Pain Assessment: Faces Faces Pain Scale: Hurts whole lot Pain Location: "all over" but esp B hips, back, B knees  Pain Descriptors / Indicators: Grimacing;Crying;Discomfort;Sharp Pain Intervention(s): Monitored during  session;Repositioned    Home Living                      Prior Function            PT Goals (current goals can now be found in the care plan section) Progress towards PT goals: Progressing toward goals    Frequency    Min 2X/week      PT Plan Current plan remains appropriate    Co-evaluation              AM-PAC PT "6 Clicks" Mobility   Outcome Measure  Help needed turning from your back to your side while in a flat bed without using bedrails?: Total Help needed moving from lying on your back to sitting on the side of a flat bed without using bedrails?: Total Help needed moving to and from a bed to a chair (including a wheelchair)?: Total Help needed standing up from a chair using your arms (e.g., wheelchair or bedside chair)?: Total Help needed to walk in hospital room?: Total Help needed climbing 3-5 steps with a railing? : Total 6 Click Score: 6    End of Session Equipment Utilized During Treatment: Gait belt Activity Tolerance: Patient limited by pain Patient left: in chair;with call bell/phone within reach Nurse Communication: Patient requests pain meds;Mobility status PT Visit Diagnosis: Other symptoms and signs involving the nervous system (R29.898);Muscle weakness (generalized) (M62.81);Other abnormalities of gait and mobility (R26.89)     Time: 9381-8299 PT Time Calculation (min) (ACUTE ONLY): 33 min  Charges:  $Therapeutic Activity: 23-37 mins                     {Burnell Matlin  PTA Acute  Rehabilitation Services Pager      [phone] Office      [phone]

## 2018-09-05 NOTE — Telephone Encounter (Signed)
Oral Oncology Patient Advocate Encounter  I have not heard from the patient so I called her room number and her cell and got no answer. I will continue to try to reach her.  This encounter will be updated until final determination.  [place] Patient [place] Phone [phone] Fax [phone]

## 2018-09-06 ENCOUNTER — Inpatient Hospital Stay (HOSPITAL_COMMUNITY): Payer: Self-pay

## 2018-09-06 LAB — GLUCOSE, CAPILLARY
GLUCOSE-CAPILLARY: 158 mg/dL — AB (ref 70–99)
Glucose-Capillary: 162 mg/dL — ABNORMAL HIGH (ref 70–99)
Glucose-Capillary: 176 mg/dL — ABNORMAL HIGH (ref 70–99)
Glucose-Capillary: 144 mg/dL — ABNORMAL HIGH (ref 70–99)

## 2018-09-06 LAB — CBC WITH DIFFERENTIAL/PLATELET
Abs Immature Granulocytes: 0.03 K/uL (ref 0.00–0.07)
Basophils Absolute: 0 10*3/uL (ref 0.0–0.1)
Basophils Relative: 1 %
Eosinophils Absolute: 0 10*3/uL (ref 0.0–0.5)
Eosinophils Relative: 0 %
HCT: 31.8 % — ABNORMAL LOW (ref 36.0–46.0)
Hemoglobin: 10.2 g/dL — ABNORMAL LOW (ref 12.0–15.0)
Immature Granulocytes: 3 %
Lymphocytes Relative: 4 %
Lymphs Abs: 0 10*3/uL — ABNORMAL LOW (ref 0.7–4.0)
MCH: 28.5 pg (ref 26.0–34.0)
MCHC: 32.1 g/dL (ref 30.0–36.0)
MCV: 88.8 fL (ref 80.0–100.0)
Monocytes Absolute: 0 10*3/uL — ABNORMAL LOW (ref 0.1–1.0)
Monocytes Relative: 4 %
Neutro Abs: 0.9 K/uL — ABNORMAL LOW (ref 1.7–7.7)
Neutrophils Relative %: 88 %
Platelets: 41 10*3/uL — ABNORMAL LOW (ref 150–400)
RBC: 3.58 MIL/uL — ABNORMAL LOW (ref 3.87–5.11)
RDW: 18.9 % — ABNORMAL HIGH (ref 11.5–15.5)
WBC: 1 10*3/uL — CL (ref 4.0–10.5)
nRBC: 3 % — ABNORMAL HIGH (ref 0.0–0.2)

## 2018-09-06 MED ORDER — DULOXETINE HCL 60 MG PO CPEP: 60.0000 mg | ORAL_CAPSULE | Freq: Every day | ORAL | 0 refills | Status: AC

## 2018-09-06 MED ORDER — DEXAMETHASONE 4 MG PO TABS: 4.0000 mg | ORAL_TABLET | Freq: Two times a day (BID) | ORAL | 0 refills | Status: AC

## 2018-09-06 MED ORDER — LIDOCAINE 5 % EX PTCH: 1.0000 | MEDICATED_PATCH | CUTANEOUS | 0 refills | Status: AC

## 2018-09-06 MED ORDER — BUPROPION HCL ER (SR) 150 MG PO TB12: 150.0000 mg | ORAL_TABLET | Freq: Every day | ORAL | 0 refills | Status: AC

## 2018-09-06 MED ORDER — BISACODYL 5 MG PO TBEC: 5.0000 mg | DELAYED_RELEASE_TABLET | Freq: Every day | ORAL | 0 refills | Status: AC | PRN

## 2018-09-06 MED ORDER — MORPHINE SULFATE ER 15 MG PO TBCR: 15.0000 mg | EXTENDED_RELEASE_TABLET | Freq: Three times a day (TID) | ORAL | 0 refills | Status: AC

## 2018-09-06 MED ORDER — OXYCODONE HCL 15 MG PO TABS: 15.0000 mg | ORAL_TABLET | ORAL | 0 refills | Status: AC | PRN

## 2018-09-06 MED ORDER — SENNOSIDES-DOCUSATE SODIUM 8.6-50 MG PO TABS: 2.0000 | ORAL_TABLET | Freq: Two times a day (BID) | ORAL | 0 refills | Status: AC

## 2018-09-06 MED ORDER — LETROZOLE 2.5 MG PO TABS: 2.5000 mg | ORAL_TABLET | Freq: Every day | ORAL | 0 refills | Status: AC

## 2018-09-06 NOTE — Care Management (Signed)
Per [facility] rep DME delivery will be between 1pm-5pm today. Pt can dc to home via ambulance transport after delivery. Medical Necessity form filled out and printed for RN to call [facility] for transport home. Marney Doctor RN,BSN [phone]

## 2018-09-06 NOTE — Discharge Summary (Signed)
Physician Discharge Summary  Kristina Mcfarland WER:[id number] DOB: 1955-12-18 DOA: 08/19/2018  PCP: Willeen Niece, PA  Admit date: 08/19/2018 Discharge date: 09/06/2018  Admitted From: Home Disposition:  Home  Recommendations for Outpatient Follow-up:  1. Follow up with PCP in 1-2 weeks 2. Please obtain BMP/CBC in one week   Home Health: Yes HHRN, PT,SW, OT, aide Equipment/Devices: Hospital bed, wheelchair, 3 and 1,  Discharge Condition: Stable CODE STATUS: Full Diet recommendation: Regular  Brief/Interim Summary:  #) Widely metastatic breast cancer/back pain: Patient was admitted with widely metastatic breast cancer and inability to ambulate.  MRI showed no evidence of cord compression or embarrassment at that time.  It appeared a significant portion of her inability to ambulate was due to pain.  Patient was started on scheduled pain medications as well as PRN.  Patient by multiple providers including physical therapists, case managers, nursing, oncology, this writer, multiple other physicians and encouraged her to use pain medication to help manage her symptoms while walking.  Patient continues to have difficulty with this.  Patient's Medicaid was applied for and is pending.  Patient will be sent home with maximal support including home health PT, RN, social work, OT, aide and then will attempt to enter skilled nursing facility once Medicaid is approved.  A significant component of patient's difficulty ambulating seems to be related to her mood and patient's antidepressants were uptitrated.  Please see below.  Oncology did start the patient on letrozole as well as Ibrance however patient's counts kept dropping on Ibrance and so she was discharged only on letrozole with outpatient follow-up with oncology.  Patient was also continued on oral dexamethasone every 12 hours.  This was continued as an outpatient.  #) Pain/psych: Patient's home duloxetine was increased to 60 mg daily.  She was started on  bupropion.  She was discharged home with prescription for these.  #) Thrombocytopenia/leukopenia: This was a combination of likely widespread metastatic breast cancer as well as initiation of Ibrance.  #) History of SVT: This is not an active issue at this time.    Discharge Diagnoses:  Principal Problem:   Intractable back pain Active Problems:   Bone metastasis (HCC)   Cancer associated pain   Weakness of both lower extremities   Metastatic breast cancer ([place])   Steroid-induced hyperglycemia   Pancytopenia (HCC)    Discharge Instructions   Allergies as of 09/06/2018      Reactions   Oxycodone-acetaminophen Nausea And Vomiting   Other    Medication for SVT, Anderal?      Medication List    TAKE these medications   ARTHRITIS STRENGTH BC POWDER PO Take 1 Package by mouth daily as needed (arthritis pain).   bisacodyl 5 MG EC tablet Commonly known as:  DULCOLAX Take 1 tablet (5 mg total) by mouth daily as needed for moderate constipation.   buPROPion 150 MG 12 hr tablet Commonly known as:  WELLBUTRIN SR Take 1 tablet (150 mg total) by mouth daily for 30 days. Start taking on:  September 07, 2018   dexamethasone 4 MG tablet Commonly known as:  DECADRON Take 1 tablet (4 mg total) by mouth every 12 (twelve) hours for 30 days. What changed:    how much to take  when to take this   DULoxetine 60 MG capsule Commonly known as:  CYMBALTA Take 1 capsule (60 mg total) by mouth daily for 30 days. Start taking on:  September 07, 2018   famotidine 20 MG tablet Commonly known as:  PEPCID Take 1 tablet (20 mg total) by mouth 2 (two) times daily.   gabapentin 100 MG capsule Commonly known as:  NEURONTIN Take 1 capsule (100 mg total) by mouth at bedtime.   letrozole 2.5 MG tablet Commonly known as:  FEMARA Take 1 tablet (2.5 mg total) by mouth daily. Start taking on:  September 07, 2018   lidocaine 5 % Commonly known as:  LIDODERM Place 1 patch onto the skin daily.  Remove & Discard patch within 12 hours or as directed by MD What changed:  Another medication with the same name was added. Make sure you understand how and when to take each.   lidocaine 5 % Commonly known as:  LIDODERM Place 1 patch onto the skin daily. Remove & Discard patch within 12 hours or as directed by MD What changed:  You were already taking a medication with the same name, and this prescription was added. Make sure you understand how and when to take each.   MELATONIN PO Take 1 tablet by mouth at bedtime as needed (sleep).   morphine 15 MG 12 hr tablet Commonly known as:  MS CONTIN Take 1 tablet (15 mg total) by mouth every 8 (eight) hours for 30 days. What changed:  when to take this   oxyCODONE 15 MG immediate release tablet Commonly known as:  ROXICODONE Take 1 tablet (15 mg total) by mouth every 4 (four) hours as needed for up to 10 days. What changed:  reasons to take this   polyethylene glycol packet Commonly known as:  MIRALAX / GLYCOLAX Take 17 g by mouth 2 (two) times daily.   senna-docusate 8.6-50 MG tablet Commonly known as:  Senokot-S Take 2 tablets by mouth 2 (two) times daily for 30 days.            Durable Medical Equipment  (From admission, onward)         Start     Ordered   09/05/18 1058  For home use only DME Hospital bed  Once    Question Answer Comment  Patient has (list medical condition): breast cancer with bone mets   The above medical condition requires: Patient requires the ability to reposition frequently   Head must be elevated greater than: 30 degrees   Bed type Semi-electric      09/05/18 1058   09/05/18 1057  For home use only DME 3 n 1  Once     09/05/18 1058   09/05/18 1057  For home use only DME lightweight manual wheelchair with seat cushion  Once    Comments:  Patient suffers from bone mets which impairs their ability to perform daily activities like walking in the home.  A walker will not resolve  issue with  performing activities of daily living. A wheelchair will allow patient to safely perform daily activities. Patient is not able to propel themselves in the home using a standard weight wheelchair due to weakness. Patient can self propel in the lightweight wheelchair.  Accessories: elevating leg rests (ELRs), wheel locks, extensions and anti-tippers.   09/05/18 1058          Allergies  Allergen Reactions  . Oxycodone-Acetaminophen Nausea And Vomiting  . Other     Medication for SVT, Anderal?    Consultations:  Oncology  Palliative care   Procedures/Studies: Ct Chest Wo Contrast  Result Date: 08/08/2018 CLINICAL DATA:  Evaluate lung nodule EXAM: CT CHEST WITHOUT CONTRAST TECHNIQUE: Multidetector CT imaging of the chest was performed following  the standard protocol without IV contrast. COMPARISON:  None available FINDINGS: Cardiovascular: No significant vascular findings. Normal heart size. No pericardial effusion. Mediastinum/Nodes: Noncalcified normal size mediastinal lymph nodes Lungs/Pleura: 6 mm nodule in the RIGHT upper lobe (image 61/5) Upper Abdomen: Limited view of the liver, kidneys, pancreas are unremarkable. Normal adrenal glands. Musculoskeletal: Extensive lytic skeletal metastasis throughout the thoracic spine. Several rib tubal bodies are nearly completely replaced by lytic process (for example T1). Example lesion at T8 extends through the pedicle measures 2.3 cm. Multiple lytic lesions within the ribs and several pathologic fractures. Lytic lesion within the manubrium additionally. IMPRESSION: 1. Indeterminate RIGHT upper lobe pulmonary nodule. In patient with metastatic breast cancer, recommend short-term CT follow-up (3 months). 2. Calcified mediastinal lymph nodes could represent breast cancer metastasis, potentially treatment effect. 3. Extensive expansile metastatic lytic lesions throughout the spine and ribs. Electronically Signed   By: Suzy Bouchard M.D.   On:  08/08/2018 08:08   Ct Chest W Contrast  Result Date: 08/08/2018 CLINICAL DATA:  Metastatic breast cancer. EXAM: CT CHEST, ABDOMEN, AND PELVIS WITH CONTRAST TECHNIQUE: Multidetector CT imaging of the chest, abdomen and pelvis was performed following the standard protocol during bolus administration of intravenous contrast. CONTRAST:  127mL OMNIPAQUE IOHEXOL 300 MG/ML  SOLN COMPARISON:  Chest CT 08/08/2018 and lumbar spine MRI FINDINGS: CT CHEST FINDINGS Cardiovascular: The heart is normal in size. No pericardial effusion. Normal caliber thoracic aorta without dissection or significant atherosclerotic calcifications. The branch vessels are patent. No definite coronary artery calcifications. Mediastinum/Nodes: Mediastinal and bilateral hilar lymphadenopathy as demonstrated on the prior recent chest CT. 8.5 mm pretracheal lymph node on image number 22. 13 mm AP window node on image number 21. 15 mm right hilar node on image number 28. 11 mm left hilar lymph node on image number 23. Lungs/Pleura: Stable 6 mm right upper lobe pulmonary nodule on image number 65 most likely a metastatic focus. A do not however see any other definite metastatic pulmonary nodules. Stable emphysematous changes and areas of pulmonary scarring. Chest wall/musculoskeletal: Skin thickening of the left breast is noted along with subareolar density. Deeper in the left breast there is a 16 mm soft tissue mass with a probable biopsy clip nearby. Extensive left axillary lymphadenopathy. Largest node measures 2.1 x 2.3 cm on image number 13. 10.5 mm left subclavicular node on image 4. 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 Evidence of tumor in the spinal canal on the left at T5, dorsally at T7, on the left side at T8 and ventrally and to the right at L2. There is no significant canal compromise at this time but findings are worrisome. CT ABDOMEN PELVIS FINDINGS Hepatobiliary: No focal hepatic  lesions to suggest metastatic disease. Simple appearing cyst noted in segment 3. The portal and hepatic veins are patent. The gallbladder is surgically absent. No common bile duct dilatation. Pancreas: No mass, inflammation or ductal dilatation. Spleen: Normal size.  No gall lesions. Adrenals/Urinary Tract: The adrenal glands and kidneys are unremarkable. Small bilateral lower pole renal calculi. The bladder is normal. Stomach/Bowel: The stomach, duodenum, small bowel and colon are unremarkable. No acute inflammatory changes, mass lesions or obstructive findings. The terminal ileum and appendix are normal. Vascular/Lymphatic: Scattered atherosclerotic calcifications involving the abdominal aorta and iliac arteries, advanced for age. No aneurysm or dissection. The branch vessels are patent. The major venous structures are patent. No mesenteric or retroperitoneal mass or adenopathy. Reproductive: The uterus and ovaries are normal. Other: No pelvic  mass or adenopathy. No free pelvic fluid collections. No inguinal adenopathy. No subcutaneous lesions. 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 Areas of cortical breakthrough and associated soft tissue masses involving the spine and pelvis. IMPRESSION: 1. Skin thickening of the left breast, subareolar masslike density and a smaller mass in the deep aspect of the left breast. Associated bulky metastatic left axillary lymphadenopathy. 2. Mediastinal and hilar lymphadenopathy and a single right upper lobe pulmonary nodule which is likely metastatic. 3. Widespread aggressive/lytic/destructive bony metastatic disease with areas of spinal canal involvement and areas of cortical breakthrough and associated soft tissue mass is most notably in the pelvis. 4. No findings to suggest abdominal/pelvic metastatic disease. Electronically Signed   By: Marijo Sanes M.D.   On: 08/08/2018 17:40   Mr  Lumbar Spine Wo Contrast  Result Date: 08/08/2018 CLINICAL DATA:  Low back hip pain. EXAM: MRI LUMBAR SPINE WITHOUT CONTRAST TECHNIQUE: Multiplanar, multisequence MR imaging of the lumbar spine was performed. No intravenous contrast was administered. COMPARISON:  Overlapping portions from CT chest dated 08/08/2018 FINDINGS: Segmentation: The lowest lumbar type non-rib-bearing vertebra is labeled as L5. Alignment:  No vertebral subluxation is observed. Vertebrae: Extensive tumor infiltration of the visualized osseous structures. There are bulging margins of a variety of cortical surfaces including posteriorly and anteriorly along the sacrum, posteriorly along the L2 vertebral body eccentric to the right, and posteriorly along the L3 vertebral body as well as in both iliac bones, suspicious for malignancy. Schmorl's nodes or small central endplate fractures along the superior and inferior endplates of L3 raise the possibility that the posterior bulging at L3 could be from mild posterior bony retropulsion. Heterogeneous lesion throughout the posterior elements of the lumbar spine. Incidental 2.2 cm hemangioma the L2 vertebral body. Congenitally short pedicles. Conus medullaris and cauda equina: Conus extends to the L1 level. Conus and cauda equina appear normal. Paraspinal and other soft tissues: Unremarkable Disc levels: L1-2: Unremarkable. L2-3: Mild right subarticular lateral recess stenosis due to posterior tumor extension along the vertebral margin, epidural extension of tumor is a distinct possibility. Borderline central narrowing of the thecal sac. L3-4: Posterior bony convexity at L3 potentially from epidural tumor or cortical bulging from tumor. Prominent central narrowing of the thecal sac primarily from short pedicles. L4-5: Prominent central narrowing of the thecal sac along with mild bilateral foraminal stenosis primarily from short pedicles, disc bulge, and facet arthropathy. L5-S1: Borderline right  foraminal stenosis due to facet and intervertebral spurring. Suspected prior right laminectomy. IMPRESSION: 1. Nearly diffuse malignant involvement of the visualized bony structures. Posterior bony convexity at multiple levels especially L2 and L3 and also in the sacrum possibly from epidural extension of tumor as well as vertebral expansion from tumor. Given the left axillary adenopathy in left posterior breast mass on prior CT chest, this is presumably from metastatic breast cancer, correlate with patient history. 2. Lumbar spondylosis, degenerative disc disease, and congenitally short pedicles contribute to prominent central narrowing of the thecal sac L3-4 and L4-5 allow with mild foraminal impingement at L2-3 and L4-5, as detailed above. Electronically Signed   By: Van Clines M.D.   On: 08/08/2018 09:53   Mr Cervical Spine W Wo Contrast  Result Date: 08/23/2018 CLINICAL DATA:  Initial evaluation for acute numbness and tingling. History of breast cancer. EXAM: MRI TOTAL SPINE WITHOUT AND WITH CONTRAST TECHNIQUE: Multisequence MR imaging of the spine from the cervical spine to the sacrum was performed prior to and  following IV contrast administration for evaluation of spinal metastatic disease. CONTRAST:  6 cc of Gadavist. COMPARISON:  Recent MRI from 109-19-2019. FINDINGS: MRI CERVICAL SPINE FINDINGS Alignment: Stable alignment with preservation of the normal cervical lordosis. No listhesis or subluxation. Vertebrae: Widespread osseous metastatic disease again seen throughout the cervical spine, not significantly changed in appearance relative to recent MRI. Associated pathologic fractures of the C4, T1, T3, and T5 vertebral bodies again seen, stable. No new or interval height loss. Tumor again seen involving both the vertebral bodies as well as the posterior elements. Involvement of the partially visualized skull base and calvarium noted as well. Diffuse dural enhancement involving the epidural space  likely related to tumor infiltration, also similar to previous. This extends from the skull base through approximately T3. Cord: Signal intensity within the cervical spinal cord is within normal limits. No evidence for intramedullary metastatic disease. Posterior Fossa, vertebral arteries, paraspinal tissues: Visualized brain and posterior fossa within normal limits. Craniocervical junction normal. Paraspinous and prevertebral soft tissues demonstrate no acute finding. Normal intravascular flow voids seen within the vertebral arteries bilaterally. Disc levels: C2-C3: No significant disc bulge. Left-sided facet hypertrophy. Prominent multifocal tumor throughout the C2 vertebral body with probable involvement of the left neural foramen, stable. No significant spinal stenosis. C3-C4: Pathologic fracture of the C4 vertebral body with associated 3 mm bony retropulsion. Superimposed left eccentric disc osteophyte. Epidural enhancement compatible with tumor with probable involvement of the bilateral neural foramina. Moderate spinal stenosis, stable. C4-C5: Mild diffuse disc bulge with bilateral uncovertebral and facet hypertrophy. Moderate spinal stenosis, stable. Moderate bilateral foraminal narrowing with enhancing epidural tumor within the bilateral neural foramina, stable. C5-C6: Circumferential disc osteophyte with intervertebral disc space narrowing. Broad posterior component flattens the ventral thecal sac with resultant moderate spinal stenosis. Moderate to severe bilateral C6 foraminal stenosis. Epidural enhancement. C6-C7: No significant disc protrusion. Bilateral facet hypertrophy. Epidural enhancement compatible with tumor. No significant spinal stenosis or neural foraminal narrowing. C7-T1: No significant disc protrusion. Left-sided facet arthrosis. Epidural enhancement compatible with tumor. MRI THORACIC SPINE FINDINGS Alignment:  Physiologic. Vertebrae: Widespread osseous metastases seen throughout the  thoracic spine, involving essentially all levels, with involvement of the vertebral bodies as well as the posterior elements. Associated pathologic fractures of T1, T3, and T5, stable. Mild concavity at the endplates of T8 and T9 also unchanged. No new fracture. Expansion of several pedicles with probable early osseous extension of tumor into the adjacent foramina seen at several levels, most notable at T10 on the right. Innumerable rib lesions noted as well. Cord: Signal intensity within the thoracic spinal cord is normal. No intramedullary metastatic disease. Conus terminates at L1. Paraspinal and other soft tissues: Paraspinous soft tissues demonstrate no acute finding. Partially visualized lungs are clear. Simple T2 hyperintense cyst noted within the right kidney. Disc levels: T4-5: Prominent tumor within the left pedicle mildly indents the left dorsal thecal sac (series 15, image 14). No significant spinal stenosis. Tumor involving the posterior lamina of T7 mildly compresses and flattens the dorsal thecal sac (series 15, image 23). No other significant spinal stenosis within the thoracic spine. Overall, appearance is stable from previous. MRI LUMBAR SPINE FINDINGS Segmentation: Standard. Lowest well-formed disc labeled the L5-S1 level. Alignment: Normal alignment with preservation of the normal lumbar lordosis. No listhesis. Vertebrae: Widespread osseous metastases seen throughout the lumbar spine and visualized sacrum and pelvis. Involvement of all levels, with involvement of the vertebral bodies as well as the posterior elements. Associated pathologic fractures at the superior  endplates of L1, L3, L4, and L5 without significant bony retropulsion. Conus medullaris: Extends to the L1 level and appears normal. No enhancing tumor seen involving the conus medullaris or nerve roots of the cauda equina. Paraspinal and other soft tissues: Paraspinous soft tissues demonstrate no acute finding. Simple right renal cyst  partially visualized. Visualized visceral structures otherwise unremarkable. Disc levels: L1-2: No significant disc bulge. Tumor involving the right posterior aspect of L2 with extension into the right pedicle indents the right ventral thecal sac with mild canal narrowing (series 23, image 12). Superimposed mild facet hypertrophy. Foramina remain patent. L2-3: No significant disc bulge. Mild bilateral facet hypertrophy. Short pedicles with resultant mild spinal stenosis. Foramina remain patent. L3-4: Mild diffuse disc bulge, eccentric to the left. Moderate facet and ligament flavum hypertrophy. Short pedicles. Resultant moderate spinal stenosis. Foramina remain patent. L4-5: Broad-based posterior disc bulge with associated annular fissure. Moderate facet and ligament flavum hypertrophy. Resultant moderate spinal stenosis, largely due to short pedicles. Foramina remain patent. L5-S1: Intervertebral disc space narrowing without significant disc bulge. Posterior endplate osseous ridging, greater on the right. Endplate osteophyte contacts the descending right S1 nerve root as it courses through the lateral recess (series 23, image 32). Additionally, early tumoral encroachment upon the right S1 foramen within the sacrum (series 23, image 35). Superimposed mild epidural lipomatosis. No significant spinal stenosis. Foramina remain patent. IMPRESSION: 1. Diffuse osseous metastatic disease involving the cervical, thoracic, and lumbar spine, stable in appearance relative to recent MRI from 1May 29, 202019. Multiple associated pathologic compression fractures involving the C4, T1, T3, T5, L1, L3, L4, and L5. 2. Mild diffuse epidural enhancement extending from the skull base through approximately the level of T3, compatible with epidural tumor. No significant spinal cord compression at this time. 3. Superimposed mild to moderate multilevel degenerative spondylolysis as above. Changes are most notable within the cervical spine were  there is resultant moderate diffuse spinal stenosis at C3-4 through C5-6. Electronically Signed   By: Jeannine Boga M.D.   On: 08/23/2018 23:11   Mr Cervical Spine W Wo Contrast  Result Date: 1May 29, 202019 CLINICAL DATA:  Back pain.  Metastatic breast cancer. EXAM: MRI CERVICAL SPINE WITHOUT AND WITH CONTRAST TECHNIQUE: Multiplanar and multiecho pulse sequences of the cervical spine, to include the craniocervical junction and cervicothoracic junction, were obtained without and with intravenous contrast. CONTRAST:  6 cc Gadavist. COMPARISON:  None. FINDINGS: Alignment: Physiologic. Vertebrae: There is metastatic disease involving the entire cervical spine and the visualized portion of the upper thoracic spine. There are pathologic fractures of C4 and T1 and T3. The tumor involves in the vertebral bodies as well as the posterior elements at multiple levels. There is abnormal dural enhancement throughout the visualized portion of the spine after contrast administration consistent with dural extension of tumor. The patient is at school significant risk for further pathologic fractures. Cord: There is no evidence of metastatic disease to the cervical spinal cord. The AP dimension of the spinal canal is narrowed from C3-4 through C6-7 by bulging discs and by posterior protrusion of the C4 vertebral body into the spinal canal. However, there is no myelopathy. Posterior Fossa, vertebral arteries, paraspinal tissues: No discrete abnormality of the paraspinal soft tissues. Disc levels: C2-3: No disc protrusion or bulging. Multiple areas of tumor within the L2 vertebra. C3-4: No disc protrusion. Tumor infiltrates C3 and C4 with a pathologic fracture of C4 with slight protrusion of the posterior margin of the C4 vertebral body into the spinal canal. Epidural enhancement consistent  with tumor. C4-5: No disc protrusion. Tumor involves both vertebra. Epidural enhancement. C5-6: Disc space narrowing. Small broad-based disc  bulge with accompanying osteophytes without neural impingement. Epidural enhancement. C6-7: Diffuse tumor in the bones. No disc protrusion. Epidural enhancement. C7-T1: Diffuse infiltration of the vertebral bodies and posterior elements by tumor. Pathologic fracture of T1 with slight protrusion of the posterior margin of T1 into the spinal canal without spinal cord impingement. Abnormal epidural enhancement. EXAM: MRI THORACIC WITHOUT AND WITH CONTRAST TECHNIQUE: Multiplanar and multiecho pulse sequences of the thoracic spine were obtained without and with intravenous contrast. CONTRAST:  6 mL Gadavist COMPARISON:  None.  CT scan of the chest dated 08/08/2018 FINDINGS: MRI THORACIC SPINE FINDINGS Alignment:  Physiologic. Vertebrae: Extensive metastatic disease involving the entire visualized portion of the spine including the vertebral bodies and posterior elements. Pathologic fractures of T1 and T3 and of the superior endplate of T5. Cord:  Normal.  Tip of the conus is at L1-2. Paraspinal and other soft tissues: Negative. Disc levels: T1-2: There is a pathologic compression fracture of T1. The posterior margin of the vertebral body extends slightly into the spinal canal without spinal cord compression. T4-5: Prominent tumor in the left pedicle and lamina of T5 slightly indents the thecal sac. Tumor in the lamina of T7 slightly compresses the posterior aspect of the thecal sac. The other levels in the thoracic spine demonstrate no significant impingement upon the thecal sac or spinal cord. Tumor in the right side of the vertebral body of L2 and in the right pedicle and posterior elements does compress the thecal sac. There is slight epidural enhancement in the upper and midthoracic spine consistent with tumor. IMPRESSION: 1. 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 The patient is at substantial risk for more pathologic fractures. 2. No discrete  spinal cord compression at this time 3. Epidural enhancement after contrast administration in the cervical and thoracic spine consistent with tumor. Electronically Signed   By: Lorriane Shire M.D.   On: 12020-11-1717 17:47   Mr Thoracic Spine W Wo Contrast  Result Date: 08/23/2018 CLINICAL DATA:  Initial evaluation for acute numbness and tingling. History of breast cancer. EXAM: MRI TOTAL SPINE WITHOUT AND WITH CONTRAST TECHNIQUE: Multisequence MR imaging of the spine from the cervical spine to the sacrum was performed prior to and following IV contrast administration for evaluation of spinal metastatic disease. CONTRAST:  6 cc of Gadavist. COMPARISON:  Recent MRI from 12020-11-1717. FINDINGS: MRI CERVICAL SPINE FINDINGS Alignment: Stable alignment with preservation of the normal cervical lordosis. No listhesis or subluxation. Vertebrae: Widespread osseous metastatic disease again seen throughout the cervical spine, not significantly changed in appearance relative to recent MRI. Associated pathologic fractures of the C4, T1, T3, and T5 vertebral bodies again seen, stable. No new or interval height loss. Tumor again seen involving both the vertebral bodies as well as the posterior elements. Involvement of the partially visualized skull base and calvarium noted as well. Diffuse dural enhancement involving the epidural space likely related to tumor infiltration, also similar to previous. This extends from the skull base through approximately T3. Cord: Signal intensity within the cervical spinal cord is within normal limits. No evidence for intramedullary metastatic disease. Posterior Fossa, vertebral arteries, paraspinal tissues: Visualized brain and posterior fossa within normal limits. Craniocervical junction normal. Paraspinous and prevertebral soft tissues demonstrate no acute finding. Normal intravascular flow voids seen within the vertebral arteries bilaterally. Disc levels: C2-C3: No significant disc bulge.  Left-sided facet hypertrophy. Prominent multifocal tumor throughout the C2 vertebral body with probable involvement of the left neural foramen, stable. No significant spinal stenosis. C3-C4: Pathologic fracture of the C4 vertebral body with associated 3 mm bony retropulsion. Superimposed left eccentric disc osteophyte. Epidural enhancement compatible with tumor with probable involvement of the bilateral neural foramina. Moderate spinal stenosis, stable. C4-C5: Mild diffuse disc bulge with bilateral uncovertebral and facet hypertrophy. Moderate spinal stenosis, stable. Moderate bilateral foraminal narrowing with enhancing epidural tumor within the bilateral neural foramina, stable. C5-C6: Circumferential disc osteophyte with intervertebral disc space narrowing. Broad posterior component flattens the ventral thecal sac with resultant moderate spinal stenosis. Moderate to severe bilateral C6 foraminal stenosis. Epidural enhancement. C6-C7: No significant disc protrusion. Bilateral facet hypertrophy. Epidural enhancement compatible with tumor. No significant spinal stenosis or neural foraminal narrowing. C7-T1: No significant disc protrusion. Left-sided facet arthrosis. Epidural enhancement compatible with tumor. MRI THORACIC SPINE FINDINGS Alignment:  Physiologic. Vertebrae: Widespread osseous metastases seen throughout the thoracic spine, involving essentially all levels, with involvement of the vertebral bodies as well as the posterior elements. Associated pathologic fractures of T1, T3, and T5, stable. Mild concavity at the endplates of T8 and T9 also unchanged. No new fracture. Expansion of several pedicles with probable early osseous extension of tumor into the adjacent foramina seen at several levels, most notable at T10 on the right. Innumerable rib lesions noted as well. Cord: Signal intensity within the thoracic spinal cord is normal. No intramedullary metastatic disease. Conus terminates at L1. Paraspinal and  other soft tissues: Paraspinous soft tissues demonstrate no acute finding. Partially visualized lungs are clear. Simple T2 hyperintense cyst noted within the right kidney. Disc levels: T4-5: Prominent tumor within the left pedicle mildly indents the left dorsal thecal sac (series 15, image 14). No significant spinal stenosis. Tumor involving the posterior lamina of T7 mildly compresses and flattens the dorsal thecal sac (series 15, image 23). No other significant spinal stenosis within the thoracic spine. Overall, appearance is stable from previous. MRI LUMBAR SPINE FINDINGS Segmentation: Standard. Lowest well-formed disc labeled the L5-S1 level. Alignment: Normal alignment with preservation of the normal lumbar lordosis. No listhesis. Vertebrae: Widespread osseous metastases seen throughout the lumbar spine and visualized sacrum and pelvis. Involvement of all levels, with involvement of the vertebral bodies as well as the posterior elements. Associated pathologic fractures at the superior endplates of L1, L3, L4, and L5 without significant bony retropulsion. Conus medullaris: Extends to the L1 level and appears normal. No enhancing tumor seen involving the conus medullaris or nerve roots of the cauda equina. Paraspinal and other soft tissues: Paraspinous soft tissues demonstrate no acute finding. Simple right renal cyst partially visualized. Visualized visceral structures otherwise unremarkable. Disc levels: L1-2: No significant disc bulge. Tumor involving the right posterior aspect of L2 with extension into the right pedicle indents the right ventral thecal sac with mild canal narrowing (series 23, image 12). Superimposed mild facet hypertrophy. Foramina remain patent. L2-3: No significant disc bulge. Mild bilateral facet hypertrophy. Short pedicles with resultant mild spinal stenosis. Foramina remain patent. L3-4: Mild diffuse disc bulge, eccentric to the left. Moderate facet and ligament flavum hypertrophy.  Short pedicles. Resultant moderate spinal stenosis. Foramina remain patent. L4-5: Broad-based posterior disc bulge with associated annular fissure. Moderate facet and ligament flavum hypertrophy. Resultant moderate spinal stenosis, largely due to short pedicles. Foramina remain patent. L5-S1: Intervertebral disc space narrowing without significant disc bulge. Posterior endplate osseous ridging, greater on the right. Endplate osteophyte contacts the descending right  S1 nerve root as it courses through the lateral recess (series 23, image 32). Additionally, early tumoral encroachment upon the right S1 foramen within the sacrum (series 23, image 35). Superimposed mild epidural lipomatosis. No significant spinal stenosis. Foramina remain patent. IMPRESSION: 1. Diffuse osseous metastatic disease involving the cervical, thoracic, and lumbar spine, stable in appearance relative to recent MRI from 12020/02/1918. Multiple associated pathologic compression fractures involving the C4, T1, T3, T5, L1, L3, L4, and L5. 2. Mild diffuse epidural enhancement extending from the skull base through approximately the level of T3, compatible with epidural tumor. No significant spinal cord compression at this time. 3. Superimposed mild to moderate multilevel degenerative spondylolysis as above. Changes are most notable within the cervical spine were there is resultant moderate diffuse spinal stenosis at C3-4 through C5-6. Electronically Signed   By: Jeannine Boga M.D.   On: 08/23/2018 23:11   Mr Thoracic Spine W Wo Contrast  Result Date: 12020/02/1918 CLINICAL DATA:  Back pain.  Metastatic breast cancer. EXAM: MRI CERVICAL SPINE WITHOUT AND WITH CONTRAST TECHNIQUE: Multiplanar and multiecho pulse sequences of the cervical spine, to include the craniocervical junction and cervicothoracic junction, were obtained without and with intravenous contrast. CONTRAST:  6 cc Gadavist. COMPARISON:  None. FINDINGS: Alignment: Physiologic.  Vertebrae: There is metastatic disease involving the entire cervical spine and the visualized portion of the upper thoracic spine. There are pathologic fractures of C4 and T1 and T3. The tumor involves in the vertebral bodies as well as the posterior elements at multiple levels. There is abnormal dural enhancement throughout the visualized portion of the spine after contrast administration consistent with dural extension of tumor. The patient is at school significant risk for further pathologic fractures. Cord: There is no evidence of metastatic disease to the cervical spinal cord. The AP dimension of the spinal canal is narrowed from C3-4 through C6-7 by bulging discs and by posterior protrusion of the C4 vertebral body into the spinal canal. However, there is no myelopathy. Posterior Fossa, vertebral arteries, paraspinal tissues: No discrete abnormality of the paraspinal soft tissues. Disc levels: C2-3: No disc protrusion or bulging. Multiple areas of tumor within the L2 vertebra. C3-4: No disc protrusion. Tumor infiltrates C3 and C4 with a pathologic fracture of C4 with slight protrusion of the posterior margin of the C4 vertebral body into the spinal canal. Epidural enhancement consistent with tumor. C4-5: No disc protrusion. Tumor involves both vertebra. Epidural enhancement. C5-6: Disc space narrowing. Small broad-based disc bulge with accompanying osteophytes without neural impingement. Epidural enhancement. C6-7: Diffuse tumor in the bones. No disc protrusion. Epidural enhancement. C7-T1: Diffuse infiltration of the vertebral bodies and posterior elements by tumor. Pathologic fracture of T1 with slight protrusion of the posterior margin of T1 into the spinal canal without spinal cord impingement. Abnormal epidural enhancement. EXAM: MRI THORACIC WITHOUT AND WITH CONTRAST TECHNIQUE: Multiplanar and multiecho pulse sequences of the thoracic spine were obtained without and with intravenous contrast. CONTRAST:   6 mL Gadavist COMPARISON:  None.  CT scan of the chest dated 08/08/2018 FINDINGS: MRI THORACIC SPINE FINDINGS Alignment:  Physiologic. Vertebrae: Extensive metastatic disease involving the entire visualized portion of the spine including the vertebral bodies and posterior elements. Pathologic fractures of T1 and T3 and of the superior endplate of T5. Cord:  Normal.  Tip of the conus is at L1-2. Paraspinal and other soft tissues: Negative. Disc levels: T1-2: There is a pathologic compression fracture of T1. The posterior margin of the vertebral body extends slightly into the  spinal canal without spinal cord compression. T4-5: Prominent tumor in the left pedicle and lamina of T5 slightly indents the thecal sac. Tumor in the lamina of T7 slightly compresses the posterior aspect of the thecal sac. The other levels in the thoracic spine demonstrate no significant impingement upon the thecal sac or spinal cord. Tumor in the right side of the vertebral body of L2 and in the right pedicle and posterior elements does compress the thecal sac. There is slight epidural enhancement in the upper and midthoracic spine consistent with tumor. IMPRESSION: 1. 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 The patient is at substantial risk for more pathologic fractures. 2. No discrete spinal cord compression at this time 3. Epidural enhancement after contrast administration in the cervical and thoracic spine consistent with tumor. Electronically Signed   By: Lorriane Shire M.D.   On: 112/07/2018 17:47   Mr Lumbar Spine W Wo Contrast  Result Date: 08/23/2018 CLINICAL DATA:  Initial evaluation for acute numbness and tingling. History of breast cancer. EXAM: MRI TOTAL SPINE WITHOUT AND WITH CONTRAST TECHNIQUE: Multisequence MR imaging of the spine from the cervical spine to the sacrum was performed prior to and following IV contrast administration for evaluation of  spinal metastatic disease. CONTRAST:  6 cc of Gadavist. COMPARISON:  Recent MRI from 112/07/2018. FINDINGS: MRI CERVICAL SPINE FINDINGS Alignment: Stable alignment with preservation of the normal cervical lordosis. No listhesis or subluxation. Vertebrae: Widespread osseous metastatic disease again seen throughout the cervical spine, not significantly changed in appearance relative to recent MRI. Associated pathologic fractures of the C4, T1, T3, and T5 vertebral bodies again seen, stable. No new or interval height loss. Tumor again seen involving both the vertebral bodies as well as the posterior elements. Involvement of the partially visualized skull base and calvarium noted as well. Diffuse dural enhancement involving the epidural space likely related to tumor infiltration, also similar to previous. This extends from the skull base through approximately T3. Cord: Signal intensity within the cervical spinal cord is within normal limits. No evidence for intramedullary metastatic disease. Posterior Fossa, vertebral arteries, paraspinal tissues: Visualized brain and posterior fossa within normal limits. Craniocervical junction normal. Paraspinous and prevertebral soft tissues demonstrate no acute finding. Normal intravascular flow voids seen within the vertebral arteries bilaterally. Disc levels: C2-C3: No significant disc bulge. Left-sided facet hypertrophy. Prominent multifocal tumor throughout the C2 vertebral body with probable involvement of the left neural foramen, stable. No significant spinal stenosis. C3-C4: Pathologic fracture of the C4 vertebral body with associated 3 mm bony retropulsion. Superimposed left eccentric disc osteophyte. Epidural enhancement compatible with tumor with probable involvement of the bilateral neural foramina. Moderate spinal stenosis, stable. C4-C5: Mild diffuse disc bulge with bilateral uncovertebral and facet hypertrophy. Moderate spinal stenosis, stable. Moderate bilateral  foraminal narrowing with enhancing epidural tumor within the bilateral neural foramina, stable. C5-C6: Circumferential disc osteophyte with intervertebral disc space narrowing. Broad posterior component flattens the ventral thecal sac with resultant moderate spinal stenosis. Moderate to severe bilateral C6 foraminal stenosis. Epidural enhancement. C6-C7: No significant disc protrusion. Bilateral facet hypertrophy. Epidural enhancement compatible with tumor. No significant spinal stenosis or neural foraminal narrowing. C7-T1: No significant disc protrusion. Left-sided facet arthrosis. Epidural enhancement compatible with tumor. MRI THORACIC SPINE FINDINGS Alignment:  Physiologic. Vertebrae: Widespread osseous metastases seen throughout the thoracic spine, involving essentially all levels, with involvement of the vertebral bodies as well as the posterior elements. Associated pathologic fractures of T1, T3, and T5,  stable. Mild concavity at the endplates of T8 and T9 also unchanged. No new fracture. Expansion of several pedicles with probable early osseous extension of tumor into the adjacent foramina seen at several levels, most notable at T10 on the right. Innumerable rib lesions noted as well. Cord: Signal intensity within the thoracic spinal cord is normal. No intramedullary metastatic disease. Conus terminates at L1. Paraspinal and other soft tissues: Paraspinous soft tissues demonstrate no acute finding. Partially visualized lungs are clear. Simple T2 hyperintense cyst noted within the right kidney. Disc levels: T4-5: Prominent tumor within the left pedicle mildly indents the left dorsal thecal sac (series 15, image 14). No significant spinal stenosis. Tumor involving the posterior lamina of T7 mildly compresses and flattens the dorsal thecal sac (series 15, image 23). No other significant spinal stenosis within the thoracic spine. Overall, appearance is stable from previous. MRI LUMBAR SPINE FINDINGS  Segmentation: Standard. Lowest well-formed disc labeled the L5-S1 level. Alignment: Normal alignment with preservation of the normal lumbar lordosis. No listhesis. Vertebrae: Widespread osseous metastases seen throughout the lumbar spine and visualized sacrum and pelvis. Involvement of all levels, with involvement of the vertebral bodies as well as the posterior elements. Associated pathologic fractures at the superior endplates of L1, L3, L4, and L5 without significant bony retropulsion. Conus medullaris: Extends to the L1 level and appears normal. No enhancing tumor seen involving the conus medullaris or nerve roots of the cauda equina. Paraspinal and other soft tissues: Paraspinous soft tissues demonstrate no acute finding. Simple right renal cyst partially visualized. Visualized visceral structures otherwise unremarkable. Disc levels: L1-2: No significant disc bulge. Tumor involving the right posterior aspect of L2 with extension into the right pedicle indents the right ventral thecal sac with mild canal narrowing (series 23, image 12). Superimposed mild facet hypertrophy. Foramina remain patent. L2-3: No significant disc bulge. Mild bilateral facet hypertrophy. Short pedicles with resultant mild spinal stenosis. Foramina remain patent. L3-4: Mild diffuse disc bulge, eccentric to the left. Moderate facet and ligament flavum hypertrophy. Short pedicles. Resultant moderate spinal stenosis. Foramina remain patent. L4-5: Broad-based posterior disc bulge with associated annular fissure. Moderate facet and ligament flavum hypertrophy. Resultant moderate spinal stenosis, largely due to short pedicles. Foramina remain patent. L5-S1: Intervertebral disc space narrowing without significant disc bulge. Posterior endplate osseous ridging, greater on the right. Endplate osteophyte contacts the descending right S1 nerve root as it courses through the lateral recess (series 23, image 32). Additionally, early tumoral  encroachment upon the right S1 foramen within the sacrum (series 23, image 35). Superimposed mild epidural lipomatosis. No significant spinal stenosis. Foramina remain patent. IMPRESSION: 1. Diffuse osseous metastatic disease involving the cervical, thoracic, and lumbar spine, stable in appearance relative to recent MRI from 107/07/202019. Multiple associated pathologic compression fractures involving the C4, T1, T3, T5, L1, L3, L4, and L5. 2. Mild diffuse epidural enhancement extending from the skull base through approximately the level of T3, compatible with epidural tumor. No significant spinal cord compression at this time. 3. Superimposed mild to moderate multilevel degenerative spondylolysis as above. Changes are most notable within the cervical spine were there is resultant moderate diffuse spinal stenosis at C3-4 through C5-6. Electronically Signed   By: Jeannine Boga M.D.   On: 08/23/2018 23:11   Ct Abdomen Pelvis W Contrast  Result Date: 08/08/2018 CLINICAL DATA:  Metastatic breast cancer. EXAM: CT CHEST, ABDOMEN, AND PELVIS WITH CONTRAST TECHNIQUE: Multidetector CT imaging of the chest, abdomen and pelvis was performed following the standard protocol during bolus  administration of intravenous contrast. CONTRAST:  178mL OMNIPAQUE IOHEXOL 300 MG/ML  SOLN COMPARISON:  Chest CT 08/08/2018 and lumbar spine MRI FINDINGS: CT CHEST FINDINGS Cardiovascular: The heart is normal in size. No pericardial effusion. Normal caliber thoracic aorta without dissection or significant atherosclerotic calcifications. The branch vessels are patent. No definite coronary artery calcifications. Mediastinum/Nodes: Mediastinal and bilateral hilar lymphadenopathy as demonstrated on the prior recent chest CT. 8.5 mm pretracheal lymph node on image number 22. 13 mm AP window node on image number 21. 15 mm right hilar node on image number 28. 11 mm left hilar lymph node on image number 23. Lungs/Pleura: Stable 6 mm right upper  lobe pulmonary nodule on image number 65 most likely a metastatic focus. A do not however see any other definite metastatic pulmonary nodules. Stable emphysematous changes and areas of pulmonary scarring. Chest wall/musculoskeletal: Skin thickening of the left breast is noted along with subareolar density. Deeper in the left breast there is a 16 mm soft tissue mass with a probable biopsy clip nearby. Extensive left axillary lymphadenopathy. Largest node measures 2.1 x 2.3 cm on image number 13. 10.5 mm left subclavicular node on image 4. 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 Evidence of tumor in the spinal canal on the left at T5, dorsally at T7, on the left side at T8 and ventrally and to the right at L2. There is no significant canal compromise at this time but findings are worrisome. CT ABDOMEN PELVIS FINDINGS Hepatobiliary: No focal hepatic lesions to suggest metastatic disease. Simple appearing cyst noted in segment 3. The portal and hepatic veins are patent. The gallbladder is surgically absent. No common bile duct dilatation. Pancreas: No mass, inflammation or ductal dilatation. Spleen: Normal size.  No gall lesions. Adrenals/Urinary Tract: The adrenal glands and kidneys are unremarkable. Small bilateral lower pole renal calculi. The bladder is normal. Stomach/Bowel: The stomach, duodenum, small bowel and colon are unremarkable. No acute inflammatory changes, mass lesions or obstructive findings. The terminal ileum and appendix are normal. Vascular/Lymphatic: Scattered atherosclerotic calcifications involving the abdominal aorta and iliac arteries, advanced for age. No aneurysm or dissection. The branch vessels are patent. The major venous structures are patent. No mesenteric or retroperitoneal mass or adenopathy. Reproductive: The uterus and ovaries are normal. Other: No pelvic mass or adenopathy. No free pelvic fluid collections. No inguinal adenopathy.  No subcutaneous lesions. 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 Areas of cortical breakthrough and associated soft tissue masses involving the spine and pelvis. IMPRESSION: 1. Skin thickening of the left breast, subareolar masslike density and a smaller mass in the deep aspect of the left breast. Associated bulky metastatic left axillary lymphadenopathy. 2. Mediastinal and hilar lymphadenopathy and a single right upper lobe pulmonary nodule which is likely metastatic. 3. Widespread aggressive/lytic/destructive bony metastatic disease with areas of spinal canal involvement and areas of cortical breakthrough and associated soft tissue mass is most notably in the pelvis. 4. No findings to suggest abdominal/pelvic metastatic disease. Electronically Signed   By: Marijo Sanes M.D.   On: 08/08/2018 17:40   Ct Biopsy  Result Date: 08/09/2018 INDICATION: Multiple lytic bone lesions EXAM: CT BIOPSY MEDICATIONS: None. ANESTHESIA/SEDATION: Fentanyl 100 mcg IV; Versed 3 mg IV Moderate Sedation Time:  14 minutes The patient was continuously monitored during the procedure by the interventional radiology nurse under my direct supervision. FLUOROSCOPY TIME:  Fluoroscopy Time:  minutes  seconds ( mGy). COMPLICATIONS: None immediate. PROCEDURE: Informed  written consent was obtained from the patient after a thorough discussion of the procedural risks, benefits and alternatives. All questions were addressed. Maximal Sterile Barrier Technique was utilized including caps, mask, sterile gowns, sterile gloves, sterile drape, hand hygiene and skin antiseptic. A timeout was performed prior to the initiation of the procedure. Under CT guidance, a(n) 17 gauge guide needle was advanced into the left sacral bone lesion. Subsequently, 3 18 gauge core biopsies were obtained. The guide needle was removed. Post biopsy images demonstrate no hemorrhage.  Patient tolerated the procedure well without complication. Vital sign monitoring by nursing staff during the procedure will continue as patient is in the special procedures unit for post procedure observation. FINDINGS: The images document guide needle placement within the left sacral bone lesion. Post biopsy images demonstrate no hemorrhage. IMPRESSION: Successful CT-guided core biopsy of a left sacral bone lesion. Electronically Signed   By: Marybelle Killings M.D.   On: 08/09/2018 15:47   Dg Chest Port 1 View  Result Date: 09/06/2018 CLINICAL DATA:  Hypoxia.  History of metastatic disease. EXAM: PORTABLE CHEST 1 VIEW COMPARISON:  CT 08/08/2018. FINDINGS: Mediastinum hilar structures normal. Heart size normal. Bilateral pulmonary interstitial prominence noted. Although a component of these changes may be chronic active interstitial process can not be excluded. No pleural effusion or pneumothorax. Bony lucencies are noted about the clavicle, bilateral ribs, left scapula. These changes consistent known metastatic disease. IMPRESSION: 1. Bilateral pulmonary interstitial prominence noted. Although a component these changes may be chronic, active interstitial process including pneumonitis can not be excluded. 2. Bony lesions consistent with known metastatic disease again noted. Electronically Signed   By: Marcello Moores  Register   On: 09/06/2018 08:15       Subjective:   Discharge Exam: Vitals:   09/06/18 0549 09/06/18 1029  BP: 100/77 92/69  Pulse: (!) 103 81  Resp: 16 20  Temp: (!) 97.4 F (36.3 C) (!) 97.5 F (36.4 C)  SpO2: (!) 88% (!) 88%   Vitals:   09/05/18 2101 09/05/18 2105 09/06/18 0549 09/06/18 1029  BP: (!) 89/72 103/78 100/77 92/69  Pulse: (!) 107 (!) 113 (!) 103 81  Resp: 16  16 20   Temp: 97.8 F (36.6 C)  (!) 97.4 F (36.3 C) (!) 97.5 F (36.4 C)  TempSrc: Oral  Oral Oral  SpO2: (!) 88% (!) 89% (!) 88% (!) 88%  Weight:      Height:       General exam: Appears calm and comfortable   Respiratory system: Clear to auscultation. Respiratory effort normal. Cardiovascular system: Regular rate and rhythm, no murmurs Gastrointestinal system: Abdomen is nondistended, soft and nontender. No organomegaly or masses felt. Normal bowel sounds heard. Central nervous system: Alert and oriented.  Lower extremity strength is 4+ out of 5 Extremities: No lower extremity edema Skin: No rashes over visible skin Psychiatry: Judgement and insight appear normal. Mood & affect appropriate.    The results of significant diagnostics from this hospitalization (including imaging, microbiology, ancillary and laboratory) are listed below for reference.     Microbiology: No results found for this or any previous visit (from the past 240 hour(s)).   Labs: BNP (last 3 results) No results for input(s): BNP in the last 8760 hours. Basic Metabolic Panel: Recent Labs  Lab 09/03/18 0349  NA 133*  K 4.2  CL 101  CO2 25  GLUCOSE 217*  BUN 25*  CREATININE <0.30*  CALCIUM 7.9*   Liver Function Tests: No results for input(s): AST, ALT,  ALKPHOS, BILITOT, PROT, ALBUMIN in the last 168 hours. No results for input(s): LIPASE, AMYLASE in the last 168 hours. No results for input(s): AMMONIA in the last 168 hours. CBC: Recent Labs  Lab 09/01/18 0518 09/02/18 0455 09/03/18 0349 09/05/18 0425 09/06/18 0334  WBC 2.8* 1.8* 1.8* 1.0* 1.0*  NEUTROABS  --   --  1.7 0.9* 0.9*  HGB 11.8* 11.7* 11.6* 10.7* 10.2*  HCT 37.3 36.4 35.3* 33.5* 31.8*  MCV 92.1 90.1 87.8 90.8 88.8  PLT 62* 46* 42* 38* 41*   Cardiac Enzymes: No results for input(s): CKTOTAL, CKMB, CKMBINDEX, TROPONINI in the last 168 hours. BNP: Invalid input(s): POCBNP CBG: Recent Labs  Lab 09/05/18 0753 09/05/18 1159 09/05/18 1706 09/05/18 2106 09/06/18 0751  GLUCAP 225* 199* 200* 127* 162*   D-Dimer No results for input(s): DDIMER in the last 72 hours. Hgb A1c No results for input(s): HGBA1C in the last 72 hours. Lipid  Profile No results for input(s): CHOL, HDL, LDLCALC, TRIG, CHOLHDL, LDLDIRECT in the last 72 hours. Thyroid function studies No results for input(s): TSH, T4TOTAL, T3FREE, THYROIDAB in the last 72 hours.  Invalid input(s): FREET3 Anemia work up No results for input(s): VITAMINB12, FOLATE, FERRITIN, TIBC, IRON, RETICCTPCT in the last 72 hours. Urinalysis    Component Value Date/Time   COLORURINE YELLOW 08/21/2018 1045   APPEARANCEUR CLOUDY (A) 08/21/2018 1045   LABSPEC 1.025 08/21/2018 1045   PHURINE 6.0 08/21/2018 1045   GLUCOSEU >=500 (A) 08/21/2018 1045   HGBUR NEGATIVE 08/21/2018 [place] 08/21/2018 1045   KETONESUR 5 (A) 08/21/2018 1045   PROTEINUR NEGATIVE 08/21/2018 1045   NITRITE NEGATIVE 08/21/2018 [place] 08/21/2018 1045   Sepsis Labs Invalid input(s): PROCALCITONIN,  WBC,  LACTICIDVEN Microbiology No results found for this or any previous visit (from the past 240 hour(s)).   Time coordinating discharge: 35  SIGNED:   Cristy Folks, MD  [facility] 09/06/2018, 11:37 AM  If 7PM-7AM, please contact night-coverage [email or web address] Password [email or web address]

## 2018-09-06 NOTE — Telephone Encounter (Signed)
Oral Oncology Patient Advocate Encounter  I went to speak with the patient in her room, she was not able to communicate with me. I spoke to her nurse, Josph Macho and asked if he had seen family members that he thought I may be able to talk to and he stated that he has not seen any family. I will check back with her on Monday.  This encounter will be updated until final determination.  [place] [facility] [place] Phone [phone] Fax [phone]

## 2018-09-06 NOTE — Discharge Instructions (Signed)
For future potential nursing facility placement: Has FL2 and PASRR number 09/05/2018. Referrals pending at facilities in [place] and various surrounding counties. Facilities will need Medicaid information once it is active

## 2018-09-07 ENCOUNTER — Inpatient Hospital Stay (HOSPITAL_COMMUNITY): Payer: Self-pay

## 2018-09-07 ENCOUNTER — Encounter (HOSPITAL_COMMUNITY): Payer: Self-pay

## 2018-09-07 LAB — CBC
HCT: 30.2 % — ABNORMAL LOW (ref 36.0–46.0)
Hemoglobin: 9.7 g/dL — ABNORMAL LOW (ref 12.0–15.0)
MCH: 29.1 pg (ref 26.0–34.0)
MCHC: 32.1 g/dL (ref 30.0–36.0)
MCV: 90.7 fL (ref 80.0–100.0)
Platelets: 36 10*3/uL — ABNORMAL LOW (ref 150–400)
RBC: 3.33 MIL/uL — ABNORMAL LOW (ref 3.87–5.11)
RDW: 19.2 % — ABNORMAL HIGH (ref 11.5–15.5)
WBC: 1 10*3/uL — CL (ref 4.0–10.5)
nRBC: 4.2 % — ABNORMAL HIGH (ref 0.0–0.2)

## 2018-09-07 LAB — BASIC METABOLIC PANEL
Anion gap: 9 (ref 5–15)
BUN: 27 mg/dL — ABNORMAL HIGH (ref 8–23)
CO2: 24 mmol/L (ref 22–32)
Calcium: 7.7 mg/dL — ABNORMAL LOW (ref 8.9–10.3)
Chloride: 103 mmol/L (ref 98–111)
Creatinine, Ser: <0.3 mg/dL — ABNORMAL LOW (ref 0.44–1.00)
Potassium: 3.9 mmol/L (ref 3.5–5.1)
Sodium: 136 mmol/L (ref 135–145)

## 2018-09-07 LAB — GLUCOSE, CAPILLARY
GLUCOSE-CAPILLARY: 156 mg/dL — AB (ref 70–99)
Glucose-Capillary: 137 mg/dL — ABNORMAL HIGH (ref 70–99)
Glucose-Capillary: 114 mg/dL — ABNORMAL HIGH (ref 70–99)
Glucose-Capillary: 146 mg/dL — ABNORMAL HIGH (ref 70–99)

## 2018-09-07 LAB — BASIC METABOLIC PANEL WITH GFR: Glucose, Bld: 146 mg/dL — ABNORMAL HIGH (ref 70–99)

## 2018-09-07 LAB — BRAIN NATRIURETIC PEPTIDE: B Natriuretic Peptide: 167.9 pg/mL — ABNORMAL HIGH (ref 0.0–100.0)

## 2018-09-07 LAB — TROPONIN I
Troponin I: <0.03 ng/mL (ref <0.03)
Troponin I: <0.03 ng/mL (ref <0.03)

## 2018-09-07 MED ORDER — SODIUM CHLORIDE (PF) 0.9 % IJ SOLN
INTRAMUSCULAR | Status: AC
  Filled 2018-09-07: qty 50

## 2018-09-07 MED ORDER — IOPAMIDOL (ISOVUE-370) INJECTION 76%
INTRAVENOUS | Status: AC
  Filled 2018-09-07: qty 100

## 2018-09-07 MED ORDER — IOPAMIDOL (ISOVUE-370) INJECTION 76%
100.0000 mL | Freq: Once | INTRAVENOUS | Status: AC | PRN
  Administered 2018-09-07: 100 mL via INTRAVENOUS

## 2018-09-07 NOTE — Progress Notes (Signed)
PROGRESS NOTE    Kristina Mcfarland  URK:[id number] DOB: October 11, 1955 DOA: 08/19/2018 PCP: Willeen Niece, PA   Brief Narrative:  63 year old woman PMH relevant for SVT and metastatic breast cancer with extensive involvement of the cervical, thoracic and lumbar spine, diagnosed late December 2019, currently receiving chemotherapy, presented with worsening back pain, leg spasms, inability to ambulate.    Assessment & Plan:   Principal Problem:   Intractable back pain Active Problems:   Bone metastasis (HCC)   Cancer associated pain   Weakness of both lower extremities   Metastatic breast cancer (HCC)   Steroid-induced hyperglycemia   Pancytopenia (HCC)   #) Widely metastatic breast cancer/back pain: Patient mainly has extensive involvement of the cervical, thoracic, lumbar spine on MRI with no evidence of cord compression or embarrassment at this time.  Unfortunately her inability to participate in physical therapy appears to be a combination of both mood and pain. -Patient still is requesting discharge on 09/10/2019 all of his equipment will be there -Case management will attempt to get necessary durable medical equipment including wheelchair, [facility] -Continue MS Contin to every 8 hours -Continue gabapentin 100 mg nightly - Continue dexamethasone 4 mg every 12 hours -Continue duloxetine 60 mg daily -Continue bupropion 300 mg daily -continue PRN oxycodone -Palliative care following, appreciate recommendations -Oncology following, they have started patient on letrozole  #) Thrombocytopenia/leukopenia: Possibly secondary to bone marrow infiltration and chemotherapy drug.  #) History of SVT: This is not an active issue at this time  Fluids: Tolerating p.o. Electrolytes: Monitor and supplement Nutrition: Regular diet   Prophylaxis:Thrombocytopenia  Disposition: Pending home equipment  Full code   Consultants:   Oncology  Palliative  care  Procedures:   none  Antimicrobials:   none    Subjective: This morning patient does not have any complaints.  She does not appear to be ready willing to talk.  She denies any nausea, vomiting, diarrhea.  Objective: Vitals:   09/06/18 1310 09/06/18 2123 09/07/18 0536 09/07/18 1208  BP: 98/76 100/67 94/75 (!) 88/68  Pulse: 96 99 (!) 115 (!) 122  Resp: 16 18 16 18   Temp:  98.1 F (36.7 C) 98.7 F (37.1 C) (!) 97.5 F (36.4 C)  TempSrc:  Oral Oral Oral  SpO2: (!) 88% 90% 91% (!) 84%  Weight:      Height:        Intake/Output Summary (Last 24 hours) at 09/07/2018 1210 Last data filed at 09/06/2018 1630 Gross per 24 hour  Intake 50 ml  Output -  Net 50 ml   Filed Weights   08/19/18 1848 08/20/18 0437  Weight: 54.4 kg 57.1 kg    Examination:  General exam: Appears calm and comfortable  Respiratory system: Clear to auscultation. Respiratory effort normal. Cardiovascular system: Regular rate and rhythm, no murmurs Gastrointestinal system: Abdomen is nondistended, soft and nontender. No organomegaly or masses felt. Normal bowel sounds heard. Central nervous system: Alert and oriented.  Lower extremity strength is 4+ out of 5 Extremities: No lower extremity edema Skin: No rashes over visible skin Psychiatry: Judgement and insight appear normal. Mood & affect appropriate.     Data Reviewed: I have personally reviewed following labs and imaging studies  CBC: Recent Labs  Lab 09/02/18 0455 09/03/18 0349 09/05/18 0425 09/06/18 0334 09/07/18 0524  WBC 1.8* 1.8* 1.0* 1.0* 1.0*  NEUTROABS  --  1.7 0.9* 0.9*  --   HGB 11.7* 11.6* 10.7* 10.2* 9.7*  HCT 36.4 35.3* 33.5*  31.8* 30.2*  MCV 90.1 87.8 90.8 88.8 90.7  PLT 46* 42* 38* 41* 36*   Basic Metabolic Panel: Recent Labs  Lab 09/03/18 0349 09/07/18 0524  NA 133* 136  K 4.2 3.9  CL 101 103  CO2 25 24  GLUCOSE 217* 146*  BUN 25* 27*  CREATININE <0.30* <0.30*  CALCIUM 7.9* 7.7*   GFR: CrCl cannot be  calculated (This lab value cannot be used to calculate CrCl because it is not a number: <0.30). Liver Function Tests: No results for input(s): AST, ALT, ALKPHOS, BILITOT, PROT, ALBUMIN in the last 168 hours. No results for input(s): LIPASE, AMYLASE in the last 168 hours. No results for input(s): AMMONIA in the last 168 hours. Coagulation Profile: No results for input(s): INR, PROTIME in the last 168 hours. Cardiac Enzymes: No results for input(s): CKTOTAL, CKMB, CKMBINDEX, TROPONINI in the last 168 hours. BNP (last 3 results) No results for input(s): PROBNP in the last 8760 hours. HbA1C: No results for input(s): HGBA1C in the last 72 hours. CBG: Recent Labs  Lab 09/06/18 1157 09/06/18 1717 09/06/18 2121 09/07/18 0738 09/07/18 1203  GLUCAP 158* 176* 144* 137* 114*   Lipid Profile: No results for input(s): CHOL, HDL, LDLCALC, TRIG, CHOLHDL, LDLDIRECT in the last 72 hours. Thyroid Function Tests: No results for input(s): TSH, T4TOTAL, FREET4, T3FREE, THYROIDAB in the last 72 hours. Anemia Panel: No results for input(s): VITAMINB12, FOLATE, FERRITIN, TIBC, IRON, RETICCTPCT in the last 72 hours. Sepsis Labs: No results for input(s): PROCALCITON, LATICACIDVEN in the last 168 hours.  No results found for this or any previous visit (from the past 240 hour(s)).       Radiology Studies: Dg Chest Port 1 View  Result Date: 09/06/2018 CLINICAL DATA:  Hypoxia.  History of metastatic disease. EXAM: PORTABLE CHEST 1 VIEW COMPARISON:  CT 08/08/2018. FINDINGS: Mediastinum hilar structures normal. Heart size normal. Bilateral pulmonary interstitial prominence noted. Although a component of these changes may be chronic active interstitial process can not be excluded. No pleural effusion or pneumothorax. Bony lucencies are noted about the clavicle, bilateral ribs, left scapula. These changes consistent known metastatic disease. IMPRESSION: 1. Bilateral pulmonary interstitial prominence noted.  Although a component these changes may be chronic, active interstitial process including pneumonitis can not be excluded. 2. Bony lesions consistent with known metastatic disease again noted. Electronically Signed   By: Marcello Moores  Register   On: 09/06/2018 08:15        Scheduled Meds: . bisacodyl  10 mg Rectal Once  . buPROPion  150 mg Oral Daily  . dexamethasone  4 mg Oral Q12H  . DULoxetine  60 mg Oral Daily  . famotidine  20 mg Oral BID  . feeding supplement (ENSURE ENLIVE)  237 mL Oral BID BM  . gabapentin  100 mg Oral QHS  . insulin aspart  0-9 Units Subcutaneous TID WC  . letrozole  2.5 mg Oral Daily  . lidocaine  1 patch Transdermal Q24H  . magic mouthwash w/lidocaine  10 mL Oral TID  . morphine  15 mg Oral Q8H  . polyethylene glycol  17 g Oral BID  . senna-docusate  2 tablet Oral BID   Continuous Infusions:    LOS: 18 days    Time spent: [place], MD [facility]  If 7PM-7AM, please contact night-coverage [email or web address] Password [email or web address] 09/07/2018, 12:10 PM

## 2018-09-08 LAB — CBC
HCT: 28.9 % — ABNORMAL LOW (ref 36.0–46.0)
Hemoglobin: 9.3 g/dL — ABNORMAL LOW (ref 12.0–15.0)
MCH: 29.6 pg (ref 26.0–34.0)
MCHC: 32.2 g/dL (ref 30.0–36.0)
MCV: 92 fL (ref 80.0–100.0)
Platelets: 35 10*3/uL — ABNORMAL LOW (ref 150–400)
RBC: 3.14 MIL/uL — ABNORMAL LOW (ref 3.87–5.11)
RDW: 19.6 % — ABNORMAL HIGH (ref 11.5–15.5)
WBC: 1.1 10*3/uL — CL (ref 4.0–10.5)
nRBC: 5.6 % — ABNORMAL HIGH (ref 0.0–0.2)

## 2018-09-08 LAB — COMPREHENSIVE METABOLIC PANEL
AST: 18 U/L (ref 15–41)
Albumin: 2.3 g/dL — ABNORMAL LOW (ref 3.5–5.0)
Alkaline Phosphatase: 130 U/L — ABNORMAL HIGH (ref 38–126)
Anion gap: 9 (ref 5–15)
BUN: 26 mg/dL — ABNORMAL HIGH (ref 8–23)
CO2: 21 mmol/L — ABNORMAL LOW (ref 22–32)
Calcium: 7.6 mg/dL — ABNORMAL LOW (ref 8.9–10.3)
Chloride: 107 mmol/L (ref 98–111)
Glucose, Bld: 139 mg/dL — ABNORMAL HIGH (ref 70–99)
Potassium: 3.9 mmol/L (ref 3.5–5.1)
Sodium: 137 mmol/L (ref 135–145)

## 2018-09-08 LAB — GLUCOSE, CAPILLARY
GLUCOSE-CAPILLARY: 123 mg/dL — AB (ref 70–99)
Glucose-Capillary: 120 mg/dL — ABNORMAL HIGH (ref 70–99)
Glucose-Capillary: 205 mg/dL — ABNORMAL HIGH (ref 70–99)
Glucose-Capillary: 153 mg/dL — ABNORMAL HIGH (ref 70–99)

## 2018-09-08 LAB — COMPREHENSIVE METABOLIC PANEL WITH GFR
ALT: 51 U/L — ABNORMAL HIGH (ref 0–44)
Creatinine, Ser: <0.3 mg/dL — ABNORMAL LOW (ref 0.44–1.00)
Total Bilirubin: 1.5 mg/dL — ABNORMAL HIGH (ref 0.3–1.2)
Total Protein: 4.6 g/dL — ABNORMAL LOW (ref 6.5–8.1)

## 2018-09-08 LAB — MAGNESIUM: Magnesium: 2.4 mg/dL (ref 1.7–2.4)

## 2018-09-08 LAB — PROCALCITONIN: Procalcitonin: <0.1 ng/mL

## 2018-09-08 LAB — TROPONIN I: Troponin I: <0.03 ng/mL (ref <0.03)

## 2018-09-08 NOTE — Progress Notes (Signed)
PROGRESS NOTE    Kristina Mcfarland  ECX:[id number] DOB: 08/13/1956 DOA: 08/19/2018 PCP: Willeen Niece, PA   Brief Narrative:  63 year old woman PMH relevant for SVT and metastatic breast cancer with extensive involvement of the cervical, thoracic and lumbar spine, diagnosed late December 2019, currently receiving chemotherapy, presented with worsening back pain, leg spasms, inability to ambulate.    Assessment & Plan:   Principal Problem:   Intractable back pain Active Problems:   Bone metastasis (HCC)   Cancer associated pain   Weakness of both lower extremities   Metastatic breast cancer ([place])   Steroid-induced hyperglycemia   Pancytopenia (HCC)  #) Acute hypoxia/chest pressure: Yesterday patient was noted to be hypoxic and mildly tachycardic.  EKG was unremarkable and troponins were negative.  Patient was placed on 2 L oxygen with resolution of hypoxia.  Chest x-ray showed some interstitial disease but no clear evidence of pneumonia.  CTA was performed that showed evidence of possible developing pneumonitis.  This could be one side effect of patient's Ibrance the patient is already on high-dose dexamethasone. - Continue oxygen -We will check BNP - Wean oxygen as tolerated -Order procalcitonin rule out infection  #) Widely metastatic breast cancer/back pain: Patient mainly has extensive involvement of the cervical, thoracic, lumbar spine on MRI with no evidence of cord compression or embarrassment at this time.  Unfortunately her inability to participate in physical therapy appears to be a combination of both mood and pain. -Patient still is requesting discharge on 09/10/2019 all of his equipment will be there -Case management will attempt to get necessary durable medical equipment including wheelchair, [facility] -Continue MS Contin to every 8 hours -Continue gabapentin 100 mg nightly - Continue dexamethasone 4 mg every 12 hours -Continue duloxetine 60 mg  daily -Continue bupropion 300 mg daily -continue PRN oxycodone -Palliative care following, appreciate recommendations -Oncology following, they have started patient on letrozole  #) Thrombocytopenia/leukopenia: Possibly secondary to bone marrow infiltration and chemotherapy drug.  #) History of SVT: This is not an active issue at this time  Fluids: Tolerating p.o. Electrolytes: Monitor and supplement Nutrition: Regular diet   Prophylaxis:Thrombocytopenia  Disposition: Pending home equipment and evaluation of hypoxia  Full code   Consultants:   Oncology  Palliative care  Procedures:   none  Antimicrobials:   none    Subjective: This morning patient was sleeping in bed.  She did not have any complaints.  She is hesitant to talk to this writer does not fully express the symptoms she is having.  Objective: Vitals:   09/07/18 1220 09/07/18 2105 09/08/18 0139 09/08/18 0519  BP: 98/68 103/77 102/83 134/78  Pulse: 100 100 (!) 109 98  Resp: 18 14 14 17   Temp:  97.8 F (36.6 C) 97.8 F (36.6 C) (!) 97.5 F (36.4 C)  TempSrc:  Oral Oral Oral  SpO2: 95% 95% 95% 93%  Weight:      Height:        Intake/Output Summary (Last 24 hours) at 09/08/2018 1106 Last data filed at 09/08/2018 0519 Gross per 24 hour  Intake 0 ml  Output -  Net 0 ml   Filed Weights   08/19/18 1848 08/20/18 0437  Weight: 54.4 kg 57.1 kg    Examination:  General exam: Appears calm and comfortable  Respiratory system: Clear to auscultation. Respiratory effort normal. Cardiovascular system: Regular rate and rhythm, no murmurs Gastrointestinal system: Abdomen is nondistended, soft and nontender. No organomegaly or masses felt. Normal bowel sounds  heard. Central nervous system: Alert and oriented.  Lower extremity strength is 4+ out of 5 Extremities: No lower extremity edema Skin: No rashes over visible skin Psychiatry: Judgement and insight appear normal. Mood & affect appropriate.      Data Reviewed: I have personally reviewed following labs and imaging studies  CBC: Recent Labs  Lab 09/03/18 0349 09/05/18 0425 09/06/18 0334 09/07/18 0524 09/08/18 0109  WBC 1.8* 1.0* 1.0* 1.0* 1.1*  NEUTROABS 1.7 0.9* 0.9*  --   --   HGB 11.6* 10.7* 10.2* 9.7* 9.3*  HCT 35.3* 33.5* 31.8* 30.2* 28.9*  MCV 87.8 90.8 88.8 90.7 92.0  PLT 42* 38* 41* 36* 35*   Basic Metabolic Panel: Recent Labs  Lab 09/03/18 0349 09/07/18 0524 09/08/18 0109  NA 133* 136 137  K 4.2 3.9 3.9  CL 101 103 107  CO2 25 24 21*  GLUCOSE 217* 146* 139*  BUN 25* 27* 26*  CREATININE <0.30* <0.30* <0.30*  CALCIUM 7.9* 7.7* 7.6*  MG  --   --  2.4   GFR: CrCl cannot be calculated (This lab value cannot be used to calculate CrCl because it is not a number: <0.30). Liver Function Tests: Recent Labs  Lab 09/08/18 0109  AST 18  ALT 51*  ALKPHOS 130*  BILITOT 1.5*  PROT 4.6*  ALBUMIN 2.3*   No results for input(s): LIPASE, AMYLASE in the last 168 hours. No results for input(s): AMMONIA in the last 168 hours. Coagulation Profile: No results for input(s): INR, PROTIME in the last 168 hours. Cardiac Enzymes: Recent Labs  Lab 09/07/18 1352 09/07/18 1924 09/08/18 0109  TROPONINI <0.03 <0.03 <0.03   BNP (last 3 results) No results for input(s): PROBNP in the last 8760 hours. HbA1C: No results for input(s): HGBA1C in the last 72 hours. CBG: Recent Labs  Lab 09/07/18 0738 09/07/18 1203 09/07/18 1642 09/07/18 2106 09/08/18 0746  GLUCAP 137* 114* 146* 156* 120*   Lipid Profile: No results for input(s): CHOL, HDL, LDLCALC, TRIG, CHOLHDL, LDLDIRECT in the last 72 hours. Thyroid Function Tests: No results for input(s): TSH, T4TOTAL, FREET4, T3FREE, THYROIDAB in the last 72 hours. Anemia Panel: No results for input(s): VITAMINB12, FOLATE, FERRITIN, TIBC, IRON, RETICCTPCT in the last 72 hours. Sepsis Labs: No results for input(s): PROCALCITON, LATICACIDVEN in the last 168 hours.  No  results found for this or any previous visit (from the past 240 hour(s)).       Radiology Studies: Ct Angio Chest Pe W Or Wo Contrast  Result Date: 09/07/2018 CLINICAL DATA:  Shortness of breath.  Metastatic breast cancer EXAM: CT ANGIOGRAPHY CHEST WITH CONTRAST TECHNIQUE: Multidetector CT imaging of the chest was performed using the standard protocol during bolus administration of intravenous contrast. Multiplanar CT image reconstructions and MIPs were obtained to evaluate the vascular anatomy. CONTRAST:  167mL ISOVUE-370 IOPAMIDOL (ISOVUE-370) INJECTION 76% COMPARISON:  08/08/2018 FINDINGS: Cardiovascular: Normal heart size. No pericardial effusion. Satisfactory opacification of the pulmonary arteries but significantly limited by motion artifact in this short of breath patient. No pulmonary embolism is seen when accounting for artifact. Mediastinum/Nodes: Mildly decreased size of mediastinal adenopathy. Lungs/Pleura: Patchy generalized ground-glass densities superimposed on emphysema. No air bronchogram, effusion, or pneumothorax. Upper Abdomen: No acute finding Musculoskeletal: Generalized osseous metastatic disease with sclerosis and erosion throughout the skeleton. T1 and T3 collapse that is similar to prior. There is multifocal cortical erosion along the spinal canal; there was recent spinal MRI Other: Known left breast mass and left axillary adenopathy. The axillary mass  has decreased in bulk since prior. Review of the MIP images confirms the above findings. IMPRESSION: 1. Significant motion degradation with no evidence of pulmonary embolism. 2. Generalized patchy ground-glass opacity superimposed on emphysema. Atypical infection, drug reaction, or other inflammatory pneumonitis are primary considerations Electronically Signed   By: Monte Fantasia M.D.   On: 09/07/2018 15:23   Dg Chest Port 1 View  Result Date: 09/07/2018 CLINICAL DATA:  63 year old female with worsening back pain and hypoxia.  Known bony metastases EXAM: PORTABLE CHEST 1 VIEW COMPARISON:  09/06/2018 FINDINGS: Cardiomediastinal silhouette unchanged in size and contour. No pneumothorax. No pleural effusion. Coarsened interstitial markings of the bilateral lungs, unchanged from the prior. No new confluent airspace disease. No acute displaced fracture. Redemonstration of callus involving the right sixth rib angle as well as irregularity at the angle of the right seventh rib. Irregularity at the posterior left fifth rib, similar to prior. IMPRESSION: Chronic lung changes without acute cardiopulmonary disease. Bilateral rib lesions, in this patient with known bony metastases. Electronically Signed   By: Corrie Mckusick D.O.   On: 09/07/2018 13:28        Scheduled Meds: . bisacodyl  10 mg Rectal Once  . buPROPion  150 mg Oral Daily  . dexamethasone  4 mg Oral Q12H  . DULoxetine  60 mg Oral Daily  . famotidine  20 mg Oral BID  . feeding supplement (ENSURE ENLIVE)  237 mL Oral BID BM  . gabapentin  100 mg Oral QHS  . insulin aspart  0-9 Units Subcutaneous TID WC  . letrozole  2.5 mg Oral Daily  . lidocaine  1 patch Transdermal Q24H  . magic mouthwash w/lidocaine  10 mL Oral TID  . morphine  15 mg Oral Q8H  . polyethylene glycol  17 g Oral BID  . senna-docusate  2 tablet Oral BID   Continuous Infusions:    LOS: 19 days    Time spent: [place], MD [facility]  If 7PM-7AM, please contact night-coverage [email or web address] Password [email or web address] 09/08/2018, 11:06 AM

## 2018-09-09 ENCOUNTER — Telehealth: Payer: Self-pay

## 2018-09-09 DIAGNOSIS — D709 Neutropenia, unspecified: Secondary | ICD-10-CM

## 2018-09-09 DIAGNOSIS — F329 Major depressive disorder, single episode, unspecified: Secondary | ICD-10-CM

## 2018-09-09 LAB — GLUCOSE, CAPILLARY
GLUCOSE-CAPILLARY: 158 mg/dL — AB (ref 70–99)
Glucose-Capillary: 145 mg/dL — ABNORMAL HIGH (ref 70–99)
Glucose-Capillary: 208 mg/dL — ABNORMAL HIGH (ref 70–99)
Glucose-Capillary: 156 mg/dL — ABNORMAL HIGH (ref 70–99)

## 2018-09-09 LAB — PROCALCITONIN: Procalcitonin: <0.1 ng/mL

## 2018-09-09 MED ORDER — ALPRAZOLAM 0.25 MG PO TABS: 0.2500 mg | ORAL_TABLET | Freq: Three times a day (TID) | ORAL | Status: DC | PRN

## 2018-09-09 MED ORDER — LIP MEDEX EX OINT
TOPICAL_OINTMENT | CUTANEOUS | Status: AC
  Administered 2018-09-09: 1
  Filled 2018-09-09: qty 7

## 2018-09-09 NOTE — Telephone Encounter (Signed)
Patient's daughter in law Kristina Mcfarland [phone]) calls requesting that Dr. Burr Medico call her or her son Kristina Mcfarland [phone]).  She states they obtained Healthcare POA over the weekend and were blind sited by the information regarding her health.  The younger siblings had not been forthcoming about her condition.  Dr. Burr Medico made aware.

## 2018-09-09 NOTE — Progress Notes (Addendum)
Kristina Mcfarland   YKZ:[birth year]   XB#:[id number]   ZRA#:[id number]  Oncology follow up note   Subjective: Patient has clinically deteriorated over the weekend, developed hypoxia and dyspnea, CT scan showed a bilateral pneumonitis.  She is more lethargic when I saw her today, slightly disoriented/confused, complains of shortness of breath, she removed her covers and try to get out of bed although she technically not able to by herself.   Objective:  Vitals:   09/09/18 0511 09/09/18 1331  BP: 105/70 92/64  Pulse: 72 (!) 120  Resp: 20 17  Temp: 98 F (36.7 C) 98.2 F (36.8 C)  SpO2: 92% 99%    Body mass index is 23.02 kg/m.  Intake/Output Summary (Last 24 hours) at 09/09/2018 1803 Last data filed at 09/09/2018 1030 Gross per 24 hour  Intake 455 ml  Output 200 ml  Net 255 ml     Sclerae unicteric  Oropharynx clear  No peripheral adenopathy  Lungs clear -- no rales or rhonchi  Heart regular rate and rhythm  Abdomen benign  Neuro nonfocal   CBG (last 3)  Recent Labs    09/09/18 0738 09/09/18 1155 09/09/18 1746  GLUCAP 158* 145* 208*     Labs:  Lab Results  Component Value Date   WBC 1.1 (LL) 09/08/2018   HGB 9.3 (L) 09/08/2018   HCT 28.9 (L) 09/08/2018   MCV 92.0 09/08/2018   PLT 35 (L) 09/08/2018   NEUTROABS 0.9 (L) 09/06/2018   CMP Latest Ref Rng & Units 09/08/2018 09/07/2018 09/03/2018  Glucose 70 - 99 mg/dL 139(H) 146(H) 217(H)  BUN 8 - 23 mg/dL 26(H) 27(H) 25(H)  Creatinine 0.44 - 1.00 mg/dL <0.30(L) <0.30(L) <0.30(L)  Sodium 135 - 145 mmol/L 137 136 133(L)  Potassium 3.5 - 5.1 mmol/L 3.9 3.9 4.2  Chloride 98 - 111 mmol/L 107 103 101  CO2 22 - 32 mmol/L 21(L) 24 25  Calcium 8.9 - 10.3 mg/dL 7.6(L) 7.7(L) 7.9(L)  Total Protein 6.5 - 8.1 g/dL 4.6(L) - -  Total Bilirubin 0.3 - 1.2 mg/dL 1.5(H) - -  Alkaline Phos 38 - 126 U/L 130(H) - -  AST 15 - 41 U/L 18 - -  ALT 0 - 44 U/L 51(H) - -     Urine Studies No results for input(s): UHGB, CRYS in the last 72  hours.  Invalid input(s): UACOL, UAPR, USPG, UPH, UTP, UGL, UKET, UBIL, UNIT, UROB, ULEU, UEPI, UWBC, URBC, [place], CAST, [place], [place]  Basic Metabolic Panel: Recent Labs  Lab 09/03/18 0349 09/07/18 0524 09/08/18 0109  NA 133* 136 137  K 4.2 3.9 3.9  CL 101 103 107  CO2 25 24 21*  GLUCOSE 217* 146* 139*  BUN 25* 27* 26*  CREATININE <0.30* <0.30* <0.30*  CALCIUM 7.9* 7.7* 7.6*  MG  --   --  2.4   GFR CrCl cannot be calculated (This lab value cannot be used to calculate CrCl because it is not a number: <0.30). Liver Function Tests: Recent Labs  Lab 09/08/18 0109  AST 18  ALT 51*  ALKPHOS 130*  BILITOT 1.5*  PROT 4.6*  ALBUMIN 2.3*   No results for input(s): LIPASE, AMYLASE in the last 168 hours. No results for input(s): AMMONIA in the last 168 hours. Coagulation profile No results for input(s): INR, PROTIME in the last 168 hours.  CBC: Recent Labs  Lab 09/03/18 0349 09/05/18 0425 09/06/18 0334 09/07/18 0524 09/08/18 0109  WBC 1.8* 1.0* 1.0* 1.0* 1.1*  NEUTROABS 1.7 0.9* 0.9*  --   --  HGB 11.6* 10.7* 10.2* 9.7* 9.3*  HCT 35.3* 33.5* 31.8* 30.2* 28.9*  MCV 87.8 90.8 88.8 90.7 92.0  PLT 42* 38* 41* 36* 35*   Cardiac Enzymes: Recent Labs  Lab 09/07/18 1352 09/07/18 1924 09/08/18 0109  TROPONINI <0.03 <0.03 <0.03   BNP: Invalid input(s): POCBNP CBG: Recent Labs  Lab 09/08/18 1719 09/08/18 2124 09/09/18 0738 09/09/18 1155 09/09/18 1746  GLUCAP 205* 153* 158* 145* 208*   D-Dimer No results for input(s): DDIMER in the last 72 hours. Hgb A1c No results for input(s): HGBA1C in the last 72 hours. Lipid Profile No results for input(s): CHOL, HDL, LDLCALC, TRIG, CHOLHDL, LDLDIRECT in the last 72 hours. Thyroid function studies No results for input(s): TSH, T4TOTAL, T3FREE, THYROIDAB in the last 72 hours.  Invalid input(s): FREET3 Anemia work up No results for input(s): VITAMINB12, FOLATE, FERRITIN, TIBC, IRON, RETICCTPCT in the last 72  hours. Microbiology No results found for this or any previous visit (from the past 240 hour(s)).    Studies:  No results found.  Assessment: 63 y.o. 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 Pancytopenia, secondary to diffuse bone mets and radiation, ruled out DIC, worsenning  4. Hypoxia secondary to bilateral pneumonitis 4. hyperglycemia, secondary to steroids  5. History of SVT 6. Deconditioning  7. Depression  8.  Metabolic encephalopathy   Plan:  -Lab reviewed, patient has developed neutropenia and thrombocytopenia, secondary to her diffuse bone metastasis and previous radiation. -I reviewed her recent CT chest images in person, which showed diffuse bilateral pneumonitis, the etiology is not clear, medication vs infectious. I think it's probably not related to Penrose, which she only received 4 doses  -due to her multiple organ failure, especially with severe back pain and inability to walk, her prognosis is guarded, and her treatment option is also limited. I think the chance of she has decent quality of life is minimal even with anticancer treatment.  I have long discussion with patient and her power of attorney, Theola Sequin, about the goal of care, and both patient and Shelly Flatten agreed to change her care to hospice and comfort care. I also informed her younger son [name] and he was in agreement also.  -I ordered Xanax for her dyspnea and anxiety -I discussed with Dr. Herbert Moors and he agrees with hospice  -I will work with palliative care and case management tomorrow to transition pt to comfort care and hospice. I will stop Letrozole on discharge.  -I will continue to f/u   I spent a total of 60  mins for her care today, >50% on face to face counseling.  Truitt Merle, MD 09/09/2018

## 2018-09-09 NOTE — Progress Notes (Signed)
PROGRESS NOTE    Kristina Mcfarland  JOA:[id number] DOB: [birth year] DOA: 08/19/2018 PCP: Willeen Niece, PA   Brief Narrative:  63 year old woman PMH relevant for SVT and metastatic breast cancer with extensive involvement of the cervical, thoracic and lumbar spine, diagnosed late December 2019, currently receiving chemotherapy, presented with worsening back pain, leg spasms, inability to ambulate.    Assessment & Plan:   Principal Problem:   Intractable back pain Active Problems:   Bone metastasis (HCC)   Cancer associated pain   Weakness of both lower extremities   Metastatic breast cancer (HCC)   Steroid-induced hyperglycemia   Pancytopenia (HCC)  #) Acute hypoxia/chest pressure: Likely secondary pneumonitis possibly from Ibrance - Continue oxygen -CT scan of the chest showed no evidence of pulmonary embolism but did show some opacities concerning for pneumonitis - Pro calcitonin is negative -Patient is already on steroids for widely metastatic breast cancer and back pain  #) Widely metastatic breast cancer/back pain: Patient mainly has extensive involvement of the cervical, thoracic, lumbar spine on MRI with no evidence of cord compression or embarrassment at this time.  Unfortunately her inability to participate in physical therapy appears to be a combination of both mood and pain. -Patient still is requesting discharge on 09/10/2019 all of his equipment will be there -Case management will attempt to get necessary durable medical equipment including wheelchair, hospital bed from [facility] -Continue MS Contin to every 8 hours -Continue gabapentin 100 mg nightly - Continue dexamethasone 4 mg every 12 hours -Continue duloxetine 60 mg daily -Continue bupropion 300 mg daily -continue PRN oxycodone -Palliative care following, appreciate recommendations -Oncology following, they have started patient on letrozole  #) Thrombocytopenia/leukopenia: Possibly secondary to bone  marrow infiltration and chemotherapy drug.  #) History of SVT: This is not an active issue at this time  Fluids: Tolerating p.o. Electrolytes: Monitor and supplement Nutrition: Regular diet   Prophylaxis:Thrombocytopenia  Disposition: Pending home equipment   Full code   Consultants:   Oncology  Palliative care  Procedures:   none  Antimicrobials:   none    Subjective: This morning patient was sleeping in bed.  She did not have any complaints.  This Probation officer had an extensive discussion with the patient's stepson who took over medical power of attorney.  They have more social support in [place].  Will discuss with case management.  Objective: Vitals:   09/08/18 1307 09/08/18 1309 09/08/18 2122 09/09/18 0511  BP: (!) 123/31 95/62 101/76 105/70  Pulse: (!) 101 96 95 72  Resp: 14  20 20   Temp: (!) 97.5 F (36.4 C)  (!) 97.5 F (36.4 C) 98 F (36.7 C)  TempSrc: Oral  Oral Oral  SpO2: 91% (!) 89% 90% 92%  Weight:      Height:        Intake/Output Summary (Last 24 hours) at 09/09/2018 1135 Last data filed at 09/09/2018 1030 Gross per 24 hour  Intake 455 ml  Output 200 ml  Net 255 ml   Filed Weights   08/19/18 1848 08/20/18 0437  Weight: 54.4 kg 57.1 kg    Examination:  General exam: Appears calm and comfortable  Respiratory system: Clear to auscultation. Respiratory effort normal. Cardiovascular system: Regular rate and rhythm, no murmurs Gastrointestinal system: Abdomen is nondistended, soft and nontender. No organomegaly or masses felt. Normal bowel sounds heard. Central nervous system: Alert and oriented.  Lower extremity strength is 4+ out of 5 Extremities: No lower extremity edema Skin: No rashes over visible skin  Psychiatry: Judgement and insight appear normal. Mood & affect appropriate.     Data Reviewed: I have personally reviewed following labs and imaging studies  CBC: Recent Labs  Lab 09/03/18 0349 09/05/18 0425 09/06/18 0334  09/07/18 0524 09/08/18 0109  WBC 1.8* 1.0* 1.0* 1.0* 1.1*  NEUTROABS 1.7 0.9* 0.9*  --   --   HGB 11.6* 10.7* 10.2* 9.7* 9.3*  HCT 35.3* 33.5* 31.8* 30.2* 28.9*  MCV 87.8 90.8 88.8 90.7 92.0  PLT 42* 38* 41* 36* 35*   Basic Metabolic Panel: Recent Labs  Lab 09/03/18 0349 09/07/18 0524 09/08/18 0109  NA 133* 136 137  K 4.2 3.9 3.9  CL 101 103 107  CO2 25 24 21*  GLUCOSE 217* 146* 139*  BUN 25* 27* 26*  CREATININE <0.30* <0.30* <0.30*  CALCIUM 7.9* 7.7* 7.6*  MG  --   --  2.4   GFR: CrCl cannot be calculated (This lab value cannot be used to calculate CrCl because it is not a number: <0.30). Liver Function Tests: Recent Labs  Lab 09/08/18 0109  AST 18  ALT 51*  ALKPHOS 130*  BILITOT 1.5*  PROT 4.6*  ALBUMIN 2.3*   No results for input(s): LIPASE, AMYLASE in the last 168 hours. No results for input(s): AMMONIA in the last 168 hours. Coagulation Profile: No results for input(s): INR, PROTIME in the last 168 hours. Cardiac Enzymes: Recent Labs  Lab 09/07/18 1352 09/07/18 1924 09/08/18 0109  TROPONINI <0.03 <0.03 <0.03   BNP (last 3 results) No results for input(s): PROBNP in the last 8760 hours. HbA1C: No results for input(s): HGBA1C in the last 72 hours. CBG: Recent Labs  Lab 09/08/18 0746 09/08/18 1201 09/08/18 1719 09/08/18 2124 09/09/18 0738  GLUCAP 120* 123* 205* 153* 158*   Lipid Profile: No results for input(s): CHOL, HDL, LDLCALC, TRIG, CHOLHDL, LDLDIRECT in the last 72 hours. Thyroid Function Tests: No results for input(s): TSH, T4TOTAL, FREET4, T3FREE, THYROIDAB in the last 72 hours. Anemia Panel: No results for input(s): VITAMINB12, FOLATE, FERRITIN, TIBC, IRON, RETICCTPCT in the last 72 hours. Sepsis Labs: Recent Labs  Lab 09/08/18 1135 09/09/18 0500  PROCALCITON <0.10 <0.10    No results found for this or any previous visit (from the past 240 hour(s)).       Radiology Studies: Ct Angio Chest Pe W Or Wo Contrast  Result  Date: 09/07/2018 CLINICAL DATA:  Shortness of breath.  Metastatic breast cancer EXAM: CT ANGIOGRAPHY CHEST WITH CONTRAST TECHNIQUE: Multidetector CT imaging of the chest was performed using the standard protocol during bolus administration of intravenous contrast. Multiplanar CT image reconstructions and MIPs were obtained to evaluate the vascular anatomy. CONTRAST:  14mL ISOVUE-370 IOPAMIDOL (ISOVUE-370) INJECTION 76% COMPARISON:  08/08/2018 FINDINGS: Cardiovascular: Normal heart size. No pericardial effusion. Satisfactory opacification of the pulmonary arteries but significantly limited by motion artifact in this short of breath patient. No pulmonary embolism is seen when accounting for artifact. Mediastinum/Nodes: Mildly decreased size of mediastinal adenopathy. Lungs/Pleura: Patchy generalized ground-glass densities superimposed on emphysema. No air bronchogram, effusion, or pneumothorax. Upper Abdomen: No acute finding Musculoskeletal: Generalized osseous metastatic disease with sclerosis and erosion throughout the skeleton. T1 and T3 collapse that is similar to prior. There is multifocal cortical erosion along the spinal canal; there was recent spinal MRI Other: Known left breast mass and left axillary adenopathy. The axillary mass has decreased in bulk since prior. Review of the MIP images confirms the above findings. IMPRESSION: 1. Significant motion degradation with no evidence of  pulmonary embolism. 2. Generalized patchy ground-glass opacity superimposed on emphysema. Atypical infection, drug reaction, or other inflammatory pneumonitis are primary considerations Electronically Signed   By: Monte Fantasia M.D.   On: 09/07/2018 15:23   Dg Chest Port 1 View  Result Date: 09/07/2018 CLINICAL DATA:  63 year old female with worsening back pain and hypoxia. Known bony metastases EXAM: PORTABLE CHEST 1 VIEW COMPARISON:  09/06/2018 FINDINGS: Cardiomediastinal silhouette unchanged in size and contour. No  pneumothorax. No pleural effusion. Coarsened interstitial markings of the bilateral lungs, unchanged from the prior. No new confluent airspace disease. No acute displaced fracture. Redemonstration of callus involving the right sixth rib angle as well as irregularity at the angle of the right seventh rib. Irregularity at the posterior left fifth rib, similar to prior. IMPRESSION: Chronic lung changes without acute cardiopulmonary disease. Bilateral rib lesions, in this patient with known bony metastases. Electronically Signed   By: Corrie Mckusick D.O.   On: 09/07/2018 13:28        Scheduled Meds: . buPROPion  150 mg Oral Daily  . dexamethasone  4 mg Oral Q12H  . DULoxetine  60 mg Oral Daily  . famotidine  20 mg Oral BID  . feeding supplement (ENSURE ENLIVE)  237 mL Oral BID BM  . gabapentin  100 mg Oral QHS  . insulin aspart  0-9 Units Subcutaneous TID WC  . letrozole  2.5 mg Oral Daily  . lidocaine  1 patch Transdermal Q24H  . magic mouthwash w/lidocaine  10 mL Oral TID  . morphine  15 mg Oral Q8H  . polyethylene glycol  17 g Oral BID  . senna-docusate  2 tablet Oral BID   Continuous Infusions:    LOS: 20 days    Time spent: [place], MD [facility]  If 7PM-7AM, please contact night-coverage [email or web address] Password [facility] 09/09/2018, 11:35 AM

## 2018-09-09 NOTE — Care Management (Signed)
Per patient and nurse request, contacted Etha Stambaugh [phone] who now states her and her husband are wanting the patient to come to [facility] to be with them. Per [facility], they cannot accept the patient until her Medicaid is approved. She states neither her nor her husband are at home to take care of her. She has contacted a facility near her that she wants her to go to but cannot pay privately, she says she needs to wait until Medicaid is approved. She says they do not have the financial resources to pay privately at a facility or hire caregivers. [facility] is willing to let her go somewhere locally with a LOG so they will have time to make arrangements once Medicaid is approved. Contacted CSW to initiate a local search with LOG.

## 2018-09-09 NOTE — Progress Notes (Signed)
LCSW contacted Kristina Mcfarland to confirm that the dept is still willing to do an LOG for patient.   LCSW reached out to local facilities willing to take a LOG to review patients clinical information. LCSW awaiting response.   LCSW will continue to follow for dc planning.   Carolin Coy [place]

## 2018-09-10 ENCOUNTER — Telehealth: Payer: Self-pay | Admitting: *Deleted

## 2018-09-10 LAB — GLUCOSE, CAPILLARY
Glucose-Capillary: 137 mg/dL — ABNORMAL HIGH (ref 70–99)
Glucose-Capillary: 139 mg/dL — ABNORMAL HIGH (ref 70–99)
Glucose-Capillary: 136 mg/dL — ABNORMAL HIGH (ref 70–99)
Glucose-Capillary: 160 mg/dL — ABNORMAL HIGH (ref 70–99)

## 2018-09-10 LAB — PROCALCITONIN: Procalcitonin: <0.1 ng/mL

## 2018-09-10 MED ORDER — SODIUM CHLORIDE 0.9 % IV BOLUS
1000.0000 mL | Freq: Once | INTRAVENOUS | Status: AC
  Administered 2018-09-10: 1000 mL via INTRAVENOUS

## 2018-09-10 NOTE — Progress Notes (Signed)
Physical Therapy Discharge Patient Details Name: Kristina Mcfarland MRN: [id number] DOB: 12/22/1955 Today's Date: 09/10/2018 Time:  -     Patient discharged from PT services secondary to medical decline - to transition to comfort care/Hospice. Please see latest therapy progress note for current level of functioning and progress toward goals.     GP     Claretha Cooper 09/10/2018, 7:31 AM [place] Pager [phone] Office [phone]

## 2018-09-10 NOTE — Progress Notes (Addendum)
Met with pt this morning to discuss disposition planning- pt very lethargic. CSW inquired as of development over the last day of plans for hospice/facility placement, pt states  "I don't know what to do." Asked that CSW speak with Theola Sequin [phone]). Spoke with stepson via phone. He explains he and wife life in [place] "and had no idea any of this was going on. She or her son were not sharing with [place] about her health status. We visited Saturday and found out that they no longer have the apartment. Her son and husband have had to move out. She is essentially homeless. We were going to take her home with [place] but can't handle her care and were looking into facility placement once her medicaid comes through. Then last night we talked to oncologist and feel hospice care is the route that would best manage her symptoms and give her quality of life." [place]'s preference is for a hospice facility- CSW is investigating options.  Sharren Bridge, MSW, LCSW Clinical Social Work 09/10/2018 [phone]  15:00 update- Discussed pt's situation at length with pt's stepson and daughter-in-law again via phone. They are now questioning wanting hospice care v treatment and seeking to understand again what pt's options are. They also want to have pt "make the decision for herself as much as she can." Inquired as to pt entering rehab and CSW discussed what the barriers to this have been (pt declining, not able/willing to participate with therapy, pain, etc). Discussed SNF long term care placement and hospice placement and care at hospice facilities v hospice care at a SNF. Daughter-in-law requesting an in-person meeting with pt and oncology to seek to "make the best decision to give her quality of life along with what is practical." Will follow up with CSW tomorrow.

## 2018-09-10 NOTE — Telephone Encounter (Signed)
Oral Oncology Patient Advocate Encounter  I went to see the patient today and she was still slightly incoherent. She was unsure if she had her income information that her son was supposed to be bringing. I asked if I could call her son and she said "yes". I called [name], her son and he is bringing his dads SSI award letter when he is able to get it.  This encounter will be updated until final determination.  [place] Patient [place] Phone [phone] Fax [phone]

## 2018-09-10 NOTE — Progress Notes (Signed)
Nutrition Brief Note  Chart reviewed. Pt now transitioning to comfort care.  No nutrition interventions warranted at this time.   Giavonni Fonder, MS, RD, LDN [place] [facility] Clinical Dietitian Pager: [phone] After Hours Pager: [phone]   

## 2018-09-10 NOTE — Telephone Encounter (Signed)
Kristina Mcfarland called stating that they are wanting to meet with Dr Burr Medico tomorrow to discuss planning for Hospice vs SNF.   Please advise

## 2018-09-10 NOTE — Progress Notes (Addendum)
SBP and pulse has fluctuated throughout the course of Patient's stay - the pulse goes from 85 to 120 throughout the shift- patient was placed on a continuous pulse ox last night due to a change in breathing pattern - it was labored and had 15-30 seconds between breaths at times; also placed on c.pulse ox to monitor her pulse more closely as well

## 2018-09-10 NOTE — Progress Notes (Signed)
PROGRESS NOTE    Kristina Mcfarland  IPJ:[id number] DOB: Aug 06, 1956 DOA: 08/19/2018 PCP: Willeen Niece, PA   Brief Narrative:  63 year old woman PMH relevant for SVT and metastatic breast cancer with extensive involvement of the cervical, thoracic and lumbar spine, diagnosed late December 2019, currently receiving chemotherapy, presented with worsening back pain, leg spasms, inability to ambulate.    Assessment & Plan:   Principal Problem:   Intractable back pain Active Problems:   Bone metastasis (HCC)   Cancer associated pain   Weakness of both lower extremities   Metastatic breast cancer ([place])   Steroid-induced hyperglycemia   Pancytopenia (HCC)  #) Hospice care: Due to patient's progressive decline including her hypoxia and persistent thrombocytopenia as well as her inability to leave the bed due to severe metastatic breast cancer back pain the family is opted to try to see if patient would qualify for hospice which oncology is on board with.  The family has agreed that she would not benefit from any additional treatment and feel like hospice would be appropriate. -Case management and social work following  #) Acute hypoxia/chest pressure: CT scan of the chest showed no evidence of pulmonary embolism but did show some opacities concerning for pneumonitis.  Does not appear to be infectious his procalcitonin is quite low.  - Continue oxygen -Patient is already on steroids for widely metastatic breast cancer and back pain  #) Widely metastatic breast cancer/back pain: Patient mainly has extensive involvement of the cervical, thoracic, lumbar spine on MRI with no evidence of cord compression or embarrassment at this time.  Unfortunately her inability to participate in physical therapy appears to be a combination of both mood and pain. -Patient still is requesting discharge on 09/10/2019 all of his equipment will be there -Case management will attempt to get necessary durable medical equipment  including wheelchair, hospital bed from [facility] -Continue MS Contin to every 8 hours -Continue gabapentin 100 mg nightly - Continue dexamethasone 4 mg every 12 hours -Continue duloxetine 60 mg daily -Continue bupropion 300 mg daily -continue PRN oxycodone -Palliative care following, appreciate recommendations -Oncology following, patient is currently on letrozole but will not be discharged on this  #) Thrombocytopenia/leukopenia: Possibly secondary to bone marrow infiltration and chemotherapy drug. Holding additional chemotherapy  #) History of SVT: This is not an active issue at this time  Fluids: Tolerating p.o. Electrolytes: Monitor and supplement Nutrition: Regular diet   Prophylaxis:Thrombocytopenia  Disposition: Pending transition to hospice  Full code, pending discussion of goals of care   Consultants:   Oncology  Palliative care  Procedures:   none  Antimicrobials:   none    Subjective: This morning patient was sleeping in bed.  She did not have any complaints.  She denies any nausea, vomiting, diarrhea.  She is not very interactive or talkative with this Probation officer.  Objective: Vitals:   09/09/18 1331 09/09/18 2052 09/10/18 0536 09/10/18 0919  BP: 92/64 107/81 100/72 98/74  Pulse: (!) 120 86 (!) 110 (!) 115  Resp: 17 20 19    Temp: 98.2 F (36.8 C) 97.7 F (36.5 C) 98.1 F (36.7 C)   TempSrc: Oral Oral Oral   SpO2: 99% 96% 94%   Weight:      Height:        Intake/Output Summary (Last 24 hours) at 09/10/2018 1107 Last data filed at 09/10/2018 0900 Gross per 24 hour  Intake 525 ml  Output -  Net 525 ml   Filed Weights   08/19/18 1848  08/20/18 0437  Weight: 54.4 kg 57.1 kg    Examination:  General exam: Appears calm and comfortable  Respiratory system: Diminished lung sounds at bases, no wheezes, crackles, rhonchi, no increased work of breathing Cardiovascular system: Regular rate and rhythm, no murmurs Gastrointestinal system:  Abdomen is nondistended, soft and nontender. No organomegaly or masses felt. Normal bowel sounds heard. Central nervous system: Alert and oriented.  Lower extremity strength is 4+ out of 5 Extremities: No lower extremity edema Skin: No rashes over visible skin Psychiatry: Judgement and insight appear normal. Mood & affect appropriate.     Data Reviewed: I have personally reviewed following labs and imaging studies  CBC: Recent Labs  Lab 09/05/18 0425 09/06/18 0334 09/07/18 0524 09/08/18 0109  WBC 1.0* 1.0* 1.0* 1.1*  NEUTROABS 0.9* 0.9*  --   --   HGB 10.7* 10.2* 9.7* 9.3*  HCT 33.5* 31.8* 30.2* 28.9*  MCV 90.8 88.8 90.7 92.0  PLT 38* 41* 36* 35*   Basic Metabolic Panel: Recent Labs  Lab 09/07/18 0524 09/08/18 0109  NA 136 137  K 3.9 3.9  CL 103 107  CO2 24 21*  GLUCOSE 146* 139*  BUN 27* 26*  CREATININE <0.30* <0.30*  CALCIUM 7.7* 7.6*  MG  --  2.4   GFR: CrCl cannot be calculated (This lab value cannot be used to calculate CrCl because it is not a number: <0.30). Liver Function Tests: Recent Labs  Lab 09/08/18 0109  AST 18  ALT 51*  ALKPHOS 130*  BILITOT 1.5*  PROT 4.6*  ALBUMIN 2.3*   No results for input(s): LIPASE, AMYLASE in the last 168 hours. No results for input(s): AMMONIA in the last 168 hours. Coagulation Profile: No results for input(s): INR, PROTIME in the last 168 hours. Cardiac Enzymes: Recent Labs  Lab 09/07/18 1352 09/07/18 1924 09/08/18 0109  TROPONINI <0.03 <0.03 <0.03   BNP (last 3 results) No results for input(s): PROBNP in the last 8760 hours. HbA1C: No results for input(s): HGBA1C in the last 72 hours. CBG: Recent Labs  Lab 09/09/18 0738 09/09/18 1155 09/09/18 1746 09/09/18 2057 09/10/18 0738  GLUCAP 158* 145* 208* 156* 137*   Lipid Profile: No results for input(s): CHOL, HDL, LDLCALC, TRIG, CHOLHDL, LDLDIRECT in the last 72 hours. Thyroid Function Tests: No results for input(s): TSH, T4TOTAL, FREET4, T3FREE,  THYROIDAB in the last 72 hours. Anemia Panel: No results for input(s): VITAMINB12, FOLATE, FERRITIN, TIBC, IRON, RETICCTPCT in the last 72 hours. Sepsis Labs: Recent Labs  Lab 09/08/18 1135 09/09/18 0500 09/10/18 0522  PROCALCITON <0.10 <0.10 <0.10    No results found for this or any previous visit (from the past 240 hour(s)).       Radiology Studies: No results found.      Scheduled Meds: . buPROPion  150 mg Oral Daily  . dexamethasone  4 mg Oral Q12H  . DULoxetine  60 mg Oral Daily  . famotidine  20 mg Oral BID  . feeding supplement (ENSURE ENLIVE)  237 mL Oral BID BM  . gabapentin  100 mg Oral QHS  . insulin aspart  0-9 Units Subcutaneous TID WC  . letrozole  2.5 mg Oral Daily  . lidocaine  1 patch Transdermal Q24H  . magic mouthwash w/lidocaine  10 mL Oral TID  . morphine  15 mg Oral Q8H  . polyethylene glycol  17 g Oral BID  . senna-docusate  2 tablet Oral BID   Continuous Infusions:    LOS: 21 days  Time spent: [place], MD [facility]  If 7PM-7AM, please contact night-coverage [email or web address] Password [facility] 09/10/2018, 11:07 AM

## 2018-09-11 ENCOUNTER — Inpatient Hospital Stay: Payer: Self-pay | Admitting: Hematology

## 2018-09-11 DIAGNOSIS — J189 Pneumonia, unspecified organism: Secondary | ICD-10-CM

## 2018-09-11 DIAGNOSIS — G9341 Metabolic encephalopathy: Secondary | ICD-10-CM

## 2018-09-11 DIAGNOSIS — R0902 Hypoxemia: Secondary | ICD-10-CM

## 2018-09-11 DIAGNOSIS — Z170 Estrogen receptor positive status [ER+]: Secondary | ICD-10-CM

## 2018-09-11 LAB — GLUCOSE, CAPILLARY
Glucose-Capillary: 95 mg/dL (ref 70–99)
Glucose-Capillary: 103 mg/dL — ABNORMAL HIGH (ref 70–99)
Glucose-Capillary: 120 mg/dL — ABNORMAL HIGH (ref 70–99)
Glucose-Capillary: 102 mg/dL — ABNORMAL HIGH (ref 70–99)

## 2018-09-11 MED ORDER — MORPHINE SULFATE ER 15 MG PO TBCR
15.0000 mg | EXTENDED_RELEASE_TABLET | Freq: Two times a day (BID) | ORAL | Status: DC
  Administered 2018-09-11 – 2018-09-13 (×4): 15 mg via ORAL
  Filled 2018-09-11 (×4): qty 1

## 2018-09-11 MED ORDER — MORPHINE SULFATE (CONCENTRATE) 10 MG/0.5ML PO SOLN
15.0000 mg | ORAL | Status: DC | PRN
  Administered 2018-09-13: 15 mg via ORAL
  Filled 2018-09-11: qty 1

## 2018-09-11 MED ORDER — HYDROMORPHONE HCL 1 MG/ML IJ SOLN
1.0000 mg | INTRAMUSCULAR | Status: DC | PRN
  Administered 2018-09-12: 1 mg via INTRAVENOUS
  Filled 2018-09-11 (×2): qty 1

## 2018-09-11 NOTE — Telephone Encounter (Signed)
Oral Oncology Patient Advocate Encounter  Application process has been stopped, patient is going to Hospice.  [place] Patient [place] Phone [phone] Fax [phone]

## 2018-09-11 NOTE — Progress Notes (Signed)
Daily Progress Note   Patient Name: Kristina Mcfarland       Date: 09/11/2018 DOB: 1956-07-07  Age: 63 y.o. MRN#: [id number] Attending Physician: Terrilee Croak, MD Primary Care Physician: Willeen Niece, PA Admit Date: 08/19/2018  Reason for Consultation/Follow-up: Establishing goals of care  Subjective: Patient is awake alert but does not seem to have full insight into what is going on.  Noted to have waxing and waning mental status per family.  Discussed with daughter and daughter in law in the hall.  Length of Stay: 22  Current Medications: Scheduled Meds:  . dexamethasone  4 mg Oral Q12H  . DULoxetine  60 mg Oral Daily  . famotidine  20 mg Oral BID  . feeding supplement (ENSURE ENLIVE)  237 mL Oral BID BM  . gabapentin  100 mg Oral QHS  . insulin aspart  0-9 Units Subcutaneous TID WC  . lidocaine  1 patch Transdermal Q24H  . magic mouthwash w/lidocaine  10 mL Oral TID  . morphine  15 mg Oral Q12H  . polyethylene glycol  17 g Oral BID  . senna-docusate  2 tablet Oral BID    Continuous Infusions:   PRN Meds: acetaminophen **OR** acetaminophen, ALPRAZolam, bisacodyl, HYDROmorphone (DILAUDID) injection, morphine CONCENTRATE, ondansetron **OR** ondansetron (ZOFRAN) IV, sodium phosphate  Physical Exam         Awake alert Tearful at times Regular work of breathing S 1 S 2  No edema Non focal  Vital Signs: BP 96/62 (BP Location: Left Arm)   Pulse (!) 104   Temp 98.3 F (36.8 C) (Oral)   Resp 14   Ht 5\' 2"  (1.575 m)   Wt 57.1 kg   SpO2 96%   BMI 23.02 kg/m  SpO2: SpO2: 96 % O2 Device: O2 Device: Nasal Cannula O2 Flow Rate: O2 Flow Rate (L/min): 2 L/min  Intake/output summary:   Intake/Output Summary (Last 24 hours) at 09/11/2018 2215 Last data filed at 09/11/2018  1500 Gross per 24 hour  Intake 60 ml  Output 950 ml  Net -890 ml   LBM: Last BM Date: 09/07/18 Baseline Weight: Weight: 54.4 kg Most recent weight: Weight: 57.1 kg      PPS 40% Palliative Assessment/Data:    Flowsheet Rows     Most Recent Value  Intake Tab  Referral Department  Hospitalist  Unit at Time of Referral  Oncology Unit  Palliative Care Primary Diagnosis  Cancer  Date Notified  08/20/18  Palliative Care Type  Return patient Palliative Care  Reason for referral  Non-pain Symptom, Clarify Goals of Care  Date of Admission  08/20/18  Date first seen by Palliative Care  08/21/18  # of days Palliative referral response time  1 Day(s)  # of days IP prior to Palliative referral  0  Clinical Assessment  Psychosocial & Spiritual Assessment  Palliative Care Outcomes      Patient Active Problem List   Diagnosis Date Noted  . Steroid-induced hyperglycemia 08/31/2018  . Pancytopenia ([employer]) 08/31/2018  . Metastatic breast cancer ([place])   . Intractable back pain 08/20/2018  . Weakness of both lower extremities 08/20/2018  . Palliative care by specialist   . Goals of care, counseling/discussion   . Cancer associated pain   . Constipation   . Bone metastasis ([place]) 08/08/2018  . Paroxysmal SVT (supraventricular tachycardia) ([place]) 08/08/2018  . LFT elevation 08/08/2018  . Back pain 08/08/2018    Palliative Care Assessment & Plan   Patient Profile:    Assessment:  63 year old woman PMH relevant for SVT and metastatic breast cancer with extensive involvement of the cervical, thoracic and lumbar spine, diagnosed late December 2019, currently receiving chemotherapy, presented with worsening back pain, leg spasms, inability to ambulate.   Recommendations/Plan: - Continue current pain and non pain symptom management medications. - Discussed with daughter and daughter in law regarding possible placement at residential hospice.  They report that this is family goal, but they  would like to get her closer to [place]. - Discussed code status with family present.  They report understanding concern and believe that DNR would be appropriate.  Her son is her 81.  He is an over the road truckdriver.  Attempted to call to discuss, but no answer.   - Reportedly less sleepy after MS Contin held. Will decrease to MS Contin BID.  She also requested changing rescue back to liquid morphine which I did today. Goals of Care and Additional Recommendations:  Limitations on Scope of Treatment: Full Scope Treatment  Code Status:    Code Status Orders  (From admission, onward)         Start     Ordered   08/20/18 0148  Full code  Continuous     08/20/18 0147        Code Status History    Date Active Date Inactive Code Status Order ID Comments User Context   08/08/2018 1307 08/18/2018 1728 Full Code [id number]  Reubin Milan, MD Inpatient       Prognosis:   Weeks?  Discharge Planning:  Hospice facility?  Care plan was discussed with family at bedside.   Thank you for allowing the Palliative Medicine Team to assist in the care of this patient.   Total Time 30 Prolonged Time Billed  no       Greater than 50%  of this time was spent counseling and coordinating care related to the above assessment and plan.  Micheline Rough, MD  Please contact Palliative Medicine Team phone at [phone] for questions and concerns.

## 2018-09-11 NOTE — Telephone Encounter (Signed)
Oral Oncology Pharmacist Encounter  Received notification from MD that patient is transitioning to hospice care and [facility] has been discontinued.  No further needs from [facility] identified at this time. Oral Oncology Clinic will sign off. Please let us know if we can be of assistance in the future.  Johny Drilling, PharmD, BCPS, BCOP  09/11/2018 4:11 PM [facility] [phone]

## 2018-09-11 NOTE — Progress Notes (Signed)
PROGRESS NOTE  Kristina Mcfarland GGE:[id number] DOB: 18-Mar-1956 DOA: 08/19/2018 PCP: Willeen Niece, PA   LOS: 22 days   Brief narrative: Patient is a 63 year old woman with PMH relevant for SVT and metastatic breast cancer with extensive involvement of the cervical, thoracic and lumbar spine, diagnosed late December 2019, currently receiving chemotherapy, presented with worsening back pain, leg spasms, inability to ambulate.   Assessment/Plan:  Principal Problem:   Intractable back pain Active Problems:   Bone metastasis (HCC)   Cancer associated pain   Weakness of both lower extremities   Metastatic breast cancer ([place])   Steroid-induced hyperglycemia   Pancytopenia ([place])  #) Hospice care: Due to patient's progressive decline including her hypoxia and persistent thrombocytopenia as well as her inability to leave the bed due to severe metastatic breast cancer back pain the family is opted to try to see if patient would qualify for hospice which oncology is on board with.  The family has agreed that she would not benefit from any additional treatment and feel like hospice would be appropriate. -Case management and social work following.   #) Acute hypoxia/chest pressure: CT scan of the chest showed no evidence of pulmonary embolism but did show some opacities concerning for pneumonitis.  Does not appear to be infectious his procalcitonin is quite low.  - Continue oxygen -Patient is already on steroids for widely metastatic breast cancer and back pain  #) Widely metastatic breast cancer/back pain: Patient mainly has extensive involvement of the cervical, thoracic, lumbar spine on MRI with no evidence of cord compression or embarrassment at this time.  Unfortunately her inability to participate in physical therapy appears to be a combination of both mood and pain. -Case management will attempt to get necessary durable medical equipment including wheelchair, hospital bed from [facility] -Continue MS Contin to every 8 hours -Continue gabapentin 100 mg nightly - Continue dexamethasone 4 mg every 12 hours -Continue duloxetine 60 mg daily -Continue bupropion 300 mg daily -continue PRN oxycodone -Palliative care following, appreciate recommendations -Oncology following, patient is currently on letrozole but will not be discharged on this  #) Thrombocytopenia/leukopenia: Possibly secondary to bone marrow infiltration and chemotherapy drug. Holding additional chemotherapy  #) History of SVT: This is not an active issue at this time  Fluids: Tolerating p.o. Electrolytes: Monitor and supplement Nutrition: Regular diet   Disposition: Pending transition to hospice if agreeable with family.  Currently remains full code  VTE Prophylaxis:  SCD   Code Status: Full Code   Antibiotics: Antibiotics Given (last 72 hours)    None     Continuous Infusions: none   Scheduled Meds: . buPROPion  150 mg Oral Daily  . dexamethasone  4 mg Oral Q12H  . DULoxetine  60 mg Oral Daily  . famotidine  20 mg Oral BID  . feeding supplement (ENSURE ENLIVE)  237 mL Oral BID BM  . gabapentin  100 mg Oral QHS  . insulin aspart  0-9 Units Subcutaneous TID WC  . letrozole  2.5 mg Oral Daily  . lidocaine  1 patch Transdermal Q24H  . magic mouthwash w/lidocaine  10 mL Oral TID  . morphine  15 mg Oral Q8H  . polyethylene glycol  17 g Oral BID  . senna-docusate  2 tablet Oral BID    PRN meds: acetaminophen **OR** acetaminophen, ALPRAZolam, bisacodyl, HYDROmorphone (DILAUDID) injection, ondansetron **OR** ondansetron (ZOFRAN) IV, oxyCODONE, sodium phosphate   Subjective: Patient seen and examined this morning.  Pleasant, middle-aged Caucasian female.  States that pain is controlled at this time.  No other issues.  Objective: Vitals:   09/11/18 1023 09/11/18 1151  BP: (!) 108/92 108/80  Pulse: (!) 118 (!) 113  Resp: 16 18  Temp: 98.4 F (36.9 C) 98.2 F (36.8 C)  SpO2: 96%  94%    Intake/Output Summary (Last 24 hours) at 09/11/2018 1422 Last data filed at 09/11/2018 1152 Gross per 24 hour  Intake 30 ml  Output 950 ml  Net -920 ml   Filed Weights   08/19/18 1848 08/20/18 0437  Weight: 54.4 kg 57.1 kg   Body mass index is 23.02 kg/m.   Physical Exam: GENERAL: Pleasant middle-aged Caucasian female HENT: No scleral pallor or icterus. Pupils equally reactive to light. Oral mucosa is moist NECK: is supple, no palpable thyroid enlargement. CHEST: Clear to auscultation. No crackles or wheezes. Non tender on palpation. Diminished breath sounds bilaterally. CVS: S1 and S2 heard, no murmur. Regular rate and rhythm. No pericardial rub. ABDOMEN: Soft, non-tender, bowel sounds are present. No palpable hepato-splenomegaly. EXTREMITIES: No edema. CNS: Alert, awake, oriented to place and person SKIN: warm and dry without rashes.  Data Review: I have personally reviewed the laboratory data and studies available.  Terrilee Croak, MD  [facility] 09/11/2018

## 2018-09-11 NOTE — Progress Notes (Signed)
Report handed off  to [facility] who will resume care.

## 2018-09-11 NOTE — Progress Notes (Signed)
Kristina Mcfarland   WUJ:[id number]   WG#:[id number]   VHQ#:[id number]  Oncology follow up note   Subjective: Patient has been more lethargic lately, arousable, has not eat or drink much.  Her pain is controlled, afebrile, with stable vital signs, soft tachycardia.  Objective:  Vitals:   09/11/18 1023 09/11/18 1151  BP: (!) 108/92 108/80  Pulse: (!) 118 (!) 113  Resp: 16 18  Temp: 98.4 F (36.9 C) 98.2 F (36.8 C)  SpO2: 96% 94%    Body mass index is 23.02 kg/m.  Intake/Output Summary (Last 24 hours) at 09/11/2018 1600 Last data filed at 09/11/2018 1500 Gross per 24 hour  Intake 60 ml  Output 950 ml  Net -890 ml     Sclerae unicteric  Oropharynx clear  No peripheral adenopathy  Lungs clear -- no rales or rhonchi  Heart regular rate and rhythm  Abdomen benign  Neuro nonfocal   CBG (last 3)  Recent Labs    09/10/18 2049 09/11/18 0736 09/11/18 1146  GLUCAP 160* 95 103*     Labs:  Lab Results  Component Value Date   WBC 1.1 (LL) 09/08/2018   HGB 9.3 (L) 09/08/2018   HCT 28.9 (L) 09/08/2018   MCV 92.0 09/08/2018   PLT 35 (L) 09/08/2018   NEUTROABS 0.9 (L) 09/06/2018   CMP Latest Ref Rng & Units 09/08/2018 09/07/2018 09/03/2018  Glucose 70 - 99 mg/dL 139(H) 146(H) 217(H)  BUN 8 - 23 mg/dL 26(H) 27(H) 25(H)  Creatinine 0.44 - 1.00 mg/dL <0.30(L) <0.30(L) <0.30(L)  Sodium 135 - 145 mmol/L 137 136 133(L)  Potassium 3.5 - 5.1 mmol/L 3.9 3.9 4.2  Chloride 98 - 111 mmol/L 107 103 101  CO2 22 - 32 mmol/L 21(L) 24 25  Calcium 8.9 - 10.3 mg/dL 7.6(L) 7.7(L) 7.9(L)  Total Protein 6.5 - 8.1 g/dL 4.6(L) - -  Total Bilirubin 0.3 - 1.2 mg/dL 1.5(H) - -  Alkaline Phos 38 - 126 U/L 130(H) - -  AST 15 - 41 U/L 18 - -  ALT 0 - 44 U/L 51(H) - -     Urine Studies No results for input(s): UHGB, CRYS in the last 72 hours.  Invalid input(s): UACOL, UAPR, USPG, UPH, UTP, UGL, UKET, UBIL, UNIT, UROB, ULEU, UEPI, UWBC, URBC, UBAC, CAST, UCOM, BILUA  Basic Metabolic Panel: Recent Labs   Lab 09/07/18 0524 09/08/18 0109  NA 136 137  K 3.9 3.9  CL 103 107  CO2 24 21*  GLUCOSE 146* 139*  BUN 27* 26*  CREATININE <0.30* <0.30*  CALCIUM 7.7* 7.6*  MG  --  2.4   GFR CrCl cannot be calculated (This lab value cannot be used to calculate CrCl because it is not a number: <0.30). Liver Function Tests: Recent Labs  Lab 09/08/18 0109  AST 18  ALT 51*  ALKPHOS 130*  BILITOT 1.5*  PROT 4.6*  ALBUMIN 2.3*   No results for input(s): LIPASE, AMYLASE in the last 168 hours. No results for input(s): AMMONIA in the last 168 hours. Coagulation profile No results for input(s): INR, PROTIME in the last 168 hours.  CBC: Recent Labs  Lab 09/05/18 0425 09/06/18 0334 09/07/18 0524 09/08/18 0109  WBC 1.0* 1.0* 1.0* 1.1*  NEUTROABS 0.9* 0.9*  --   --   HGB 10.7* 10.2* 9.7* 9.3*  HCT 33.5* 31.8* 30.2* 28.9*  MCV 90.8 88.8 90.7 92.0  PLT 38* 41* 36* 35*   Cardiac Enzymes: Recent Labs  Lab 09/07/18 1352 09/07/18 1924 09/08/18 0109  TROPONINI <0.03 <0.03 <0.03   BNP: Invalid input(s): POCBNP CBG: Recent Labs  Lab 09/10/18 1125 09/10/18 1618 09/10/18 2049 09/11/18 0736 09/11/18 1146  GLUCAP 139* 136* 160* 95 103*   D-Dimer No results for input(s): DDIMER in the last 72 hours. Hgb A1c No results for input(s): HGBA1C in the last 72 hours. Lipid Profile No results for input(s): CHOL, HDL, LDLCALC, TRIG, CHOLHDL, LDLDIRECT in the last 72 hours. Thyroid function studies No results for input(s): TSH, T4TOTAL, T3FREE, THYROIDAB in the last 72 hours.  Invalid input(s): FREET3 Anemia work up No results for input(s): VITAMINB12, FOLATE, FERRITIN, TIBC, IRON, RETICCTPCT in the last 72 hours. Microbiology No results found for this or any previous visit (from the past 240 hour(s)).    Studies:  No results found.  Assessment: 63 y.o. postmenopausal Caucasian female, with past medical history of SVT, palpable left breast mass 4 months ago, presented with severe low  back and bilateral hip pain  1.Diffuse metastatic breast cancer to bones, ER+/PR-/HER2-  2.Severe back pain and low extremity weakness, secondary to #1, unable to stand or ambulatory  3. Pancytopenia, secondary to diffuse bone mets and radiation, ruled out DIC, worsenning  4. Hypoxia secondary to bilateral pneumonitis, on [place] oxygen  4. hyperglycemia, secondary to steroids, controlled  5. History of SVT 6. Deconditioning  7. Depression  8.  Metabolic encephalopathy, worsening    Plan:  -I had a family meeting with patient, her logical daughter Docia Barrier and son Destin, daughter-in-law Nira Conn, and her stepson/power of attorney Shelly Flatten was on the phone during the meeting. I updated her condition, especially her worsening cytopenia, respiratory failure, and metabolic encephalopathy.  Due to the overall deterioration, patient and her family members have agreed to switch to comfort care.  -I will stop her letrozole, no more blood test -I spoke with palliative care team Dr. Nyoka Cowden he will f/u with pt and her family to help with the transition and he will address code status with them  -the issue of discharge planning will be discussed with SW, I left VM to SW [place] today.  Family would like to her go to a facility in [place], unfortunately her Medicaid and disability are still pending -I will f/u   I spent a total of 40 mins for her care today, >50% on face to face counseling.  Truitt Merle, MD 09/11/2018

## 2018-09-11 NOTE — Progress Notes (Signed)
MEWS Guidelines - (patients age 63 and over)  Red - At High Risk for Deterioration Yellow - At risk for Deterioration  1. Go to room and assess patient 2. Validate data. Is this patient's baseline? If data confirmed: 3. Is this an acute change? 4. Administer prn meds/treatments as ordered. 5. Note Sepsis score 6. Review goals of care 7. Sports coach, RRT nurse and Provider. 8. Ask Provider to come to bedside.  9. Document patient condition/interventions/response. 10. Increase frequency of vital signs and focused assessments to at least q15 minutes x 4, then q30 minutes x2. - If stable, then q1h x3, then q4h x3 and then q8h or dept. routine. - If unstable, contact Provider & RRT nurse. Prepare for possible transfer. 11. Add entry in progress notes using the smart phrase ".MEWS". 1. Go to room and assess patient 2. Validate data. Is this patient's baseline? If data confirmed: 3. Is this an acute change? 4. Administer prn meds/treatments as ordered? 5. Note Sepsis score 6. Review goals of care 7. Sports coach and Provider 8. Call RRT nurse as needed. 9. Document patient condition/interventions/response. 10. Increase frequency of vital signs and focused assessments to at least q2h x2. - If stable, then q4h x2 and then q8h or dept. routine. - If unstable, contact Provider & RRT nurse. Prepare for possible transfer. 11. Add entry in progress notes using the smart phrase ".MEWS".  Green - Likely stable Lavender - Comfort Care Only  1. Continue routine/ordered monitoring.  2. Review goals of care. 1. Continue routine/ordered monitoring. 2. Review goals of care.   Pt tachycardic, not an acute change. Pt comfortable with no s/s of distress. No complaints at this time, family at bedside. Will continue to monitor.

## 2018-09-12 LAB — GLUCOSE, CAPILLARY
Glucose-Capillary: 141 mg/dL — ABNORMAL HIGH (ref 70–99)
Glucose-Capillary: 133 mg/dL — ABNORMAL HIGH (ref 70–99)
Glucose-Capillary: 130 mg/dL — ABNORMAL HIGH (ref 70–99)
Glucose-Capillary: 135 mg/dL — ABNORMAL HIGH (ref 70–99)

## 2018-09-12 LAB — BASIC METABOLIC PANEL
Anion gap: 9 (ref 5–15)
BUN: 22 mg/dL (ref 8–23)
CO2: 22 mmol/L (ref 22–32)
Calcium: 7.6 mg/dL — ABNORMAL LOW (ref 8.9–10.3)
Chloride: 107 mmol/L (ref 98–111)
Creatinine, Ser: <0.3 mg/dL — ABNORMAL LOW (ref 0.44–1.00)
GLUCOSE: 147 mg/dL — AB (ref 70–99)
Potassium: 3.5 mmol/L (ref 3.5–5.1)
Sodium: 138 mmol/L (ref 135–145)

## 2018-09-12 LAB — CBC WITH DIFFERENTIAL/PLATELET
Abs Immature Granulocytes: 0.04 10*3/uL (ref 0.00–0.07)
Basophils Absolute: 0 10*3/uL (ref 0.0–0.1)
Basophils Relative: 1 %
EOS ABS: 0 10*3/uL (ref 0.0–0.5)
Eosinophils Relative: 0 %
HCT: 29 % — ABNORMAL LOW (ref 36.0–46.0)
Hemoglobin: 8.9 g/dL — ABNORMAL LOW (ref 12.0–15.0)
Immature Granulocytes: 4 %
Lymphocytes Relative: 2 %
Lymphs Abs: 0 10*3/uL — ABNORMAL LOW (ref 0.7–4.0)
MCH: 28.1 pg (ref 26.0–34.0)
MCHC: 30.7 g/dL (ref 30.0–36.0)
MCV: 91.5 fL (ref 80.0–100.0)
Monocytes Absolute: 0.1 10*3/uL (ref 0.1–1.0)
Monocytes Relative: 7 %
Neutro Abs: 1 10*3/uL — ABNORMAL LOW (ref 1.7–7.7)
Neutrophils Relative %: 86 %
Platelets: 34 10*3/uL — ABNORMAL LOW (ref 150–400)
RBC: 3.17 MIL/uL — ABNORMAL LOW (ref 3.87–5.11)
RDW: 20.3 % — ABNORMAL HIGH (ref 11.5–15.5)
WBC: 1.1 10*3/uL — CL (ref 4.0–10.5)
nRBC: 6.4 % — ABNORMAL HIGH (ref 0.0–0.2)

## 2018-09-12 LAB — MAGNESIUM: Magnesium: 2.1 mg/dL (ref 1.7–2.4)

## 2018-09-12 MED ORDER — GABAPENTIN 250 MG/5ML PO SOLN
100.0000 mg | Freq: Every day | ORAL | Status: DC
  Filled 2018-09-12 (×2): qty 2

## 2018-09-12 NOTE — Progress Notes (Signed)
Daily Progress Note   Patient Name: Kristina Mcfarland       Date: 09/12/2018 DOB: 22-Nov-1955  Age: 63 y.o. MRN#: [id number] Attending Physician: Barb Merino, MD Primary Care Physician: Willeen Niece, PA Admit Date: 08/19/2018  Reason for Consultation/Follow-up: Establishing goals of care  Subjective: Patient is awake alert but does not seem to have full insight into what is going on.  Noted to have waxing and waning mental status per family.  Discussed with family at bedside.  Length of Stay: 23  Current Medications: Scheduled Meds:  . dexamethasone  4 mg Oral Q12H  . DULoxetine  60 mg Oral Daily  . famotidine  20 mg Oral BID  . feeding supplement (ENSURE ENLIVE)  237 mL Oral BID BM  . gabapentin  100 mg Oral QHS  . insulin aspart  0-9 Units Subcutaneous TID WC  . lidocaine  1 patch Transdermal Q24H  . magic mouthwash w/lidocaine  10 mL Oral TID  . morphine  15 mg Oral Q12H  . polyethylene glycol  17 g Oral BID  . senna-docusate  2 tablet Oral BID    Continuous Infusions:   PRN Meds: acetaminophen **OR** acetaminophen, ALPRAZolam, bisacodyl, HYDROmorphone (DILAUDID) injection, morphine CONCENTRATE, ondansetron **OR** ondansetron (ZOFRAN) IV, sodium phosphate  Physical Exam          Awake alert Regular work of breathing S 1 S 2  No edema Non focal  Vital Signs: BP 112/73 (BP Location: Right Arm)   Pulse (!) 106   Temp 97.9 F (36.6 C) (Oral)   Resp 16   Ht 5\' 2"  (1.575 m)   Wt 57.1 kg   SpO2 96%   BMI 23.02 kg/m  SpO2: SpO2: 96 % O2 Device: O2 Device: Nasal Cannula O2 Flow Rate: O2 Flow Rate (L/min): 2 L/min  Intake/output summary:   Intake/Output Summary (Last 24 hours) at 09/12/2018 1811 Last data filed at 09/12/2018 1300 Gross per 24 hour  Intake 180 ml    Output 350 ml  Net -170 ml   LBM: Last BM Date: 09/11/18 Baseline Weight: Weight: 54.4 kg Most recent weight: Weight: 57.1 kg      PPS 40% Palliative Assessment/Data:    Flowsheet Rows     Most Recent Value  Intake Tab  Referral Department  Hospitalist  Unit at Time of  Referral  Oncology Unit  Palliative Care Primary Diagnosis  Cancer  Date Notified  08/20/18  Palliative Care Type  Return patient Palliative Care  Reason for referral  Non-pain Symptom, Clarify Goals of Care  Date of Admission  08/20/18  Date first seen by Palliative Care  08/21/18  # of days Palliative referral response time  1 Day(s)  # of days IP prior to Palliative referral  0  Clinical Assessment  Psychosocial & Spiritual Assessment  Palliative Care Outcomes      Patient Active Problem List   Diagnosis Date Noted  . Steroid-induced hyperglycemia 08/31/2018  . Pancytopenia ([place]) 08/31/2018  . Metastatic breast cancer ([place])   . Intractable back pain 08/20/2018  . Weakness of both lower extremities 08/20/2018  . Palliative care by specialist   . Goals of care, counseling/discussion   . Cancer associated pain   . Constipation   . Bone metastasis ([place]) 08/08/2018  . Paroxysmal SVT (supraventricular tachycardia) ([place]) 08/08/2018  . LFT elevation 08/08/2018  . Back pain 08/08/2018    Palliative Care Assessment & Plan   Patient Profile:    Assessment:  63 year old woman PMH relevant for SVT and metastatic breast cancer with extensive involvement of the cervical, thoracic and lumbar spine, diagnosed late December 2019, currently receiving chemotherapy, presented with worsening back pain, leg spasms, inability to ambulate.   Recommendations/Plan: - Continue current pain and non pain symptom management medications. - Her son is her 68.  He is an over the road truckdriver.  Attempted to call to discuss, but no answer.    Goals of Care and Additional Recommendations:  Limitations on Scope of  Treatment: Full Scope Treatment  Code Status:    Code Status Orders  (From admission, onward)         Start     Ordered   08/20/18 0148  Full code  Continuous     08/20/18 0147        Code Status History    Date Active Date Inactive Code Status Order ID Comments User Context   08/08/2018 1307 08/18/2018 1728 Full Code [id number]  Reubin Milan, MD Inpatient       Prognosis:   Weeks?  Discharge Planning:  Hospice facility?  Care plan was discussed with family at bedside.   Thank you for allowing the Palliative Medicine Team to assist in the care of this patient.   Total Time 20 Prolonged Time Billed  no       Greater than 50%  of this time was spent counseling and coordinating care related to the above assessment and plan.  Micheline Rough, MD  Please contact Palliative Medicine Team phone at [phone] for questions and concerns.

## 2018-09-12 NOTE — Progress Notes (Signed)
MEWS Guidelines - (patients age 63 and over)  Red - At High Risk for Deterioration Yellow - At risk for Deterioration  1. Go to room and assess patient 2. Validate data. Is this patient's baseline? If data confirmed: 3. Is this an acute change? 4. Administer prn meds/treatments as ordered. 5. Note Sepsis score 6. Review goals of care 7. Sports coach, RRT nurse and Provider. 8. Ask Provider to come to bedside.  9. Document patient condition/interventions/response. 10. Increase frequency of vital signs and focused assessments to at least q15 minutes x 4, then q30 minutes x2. - If stable, then q1h x3, then q4h x3 and then q8h or dept. routine. - If unstable, contact Provider & RRT nurse. Prepare for possible transfer. 11. Add entry in progress notes using the smart phrase ".MEWS". 1. Go to room and assess patient 2. Validate data. Is this patient's baseline? If data confirmed: 3. Is this an acute change? 4. Administer prn meds/treatments as ordered? 5. Note Sepsis score 6. Review goals of care 7. Sports coach and Provider 8. Call RRT nurse as needed. 9. Document patient condition/interventions/response. 10. Increase frequency of vital signs and focused assessments to at least q2h x2. - If stable, then q4h x2 and then q8h or dept. routine. - If unstable, contact Provider & RRT nurse. Prepare for possible transfer. 11. Add entry in progress notes using the smart phrase ".MEWS".  Green - Likely stable Lavender - Comfort Care Only  1. Continue routine/ordered monitoring.  2. Review goals of care. 1. Continue routine/ordered monitoring. 2. Review goals of care.   Patients VS stable, MD aware of b/p and respirations. Patient is comfort care but still a full code at this time. Will continue to monitor.

## 2018-09-12 NOTE — Progress Notes (Signed)
Have been pursuing hospice/placement options in [place]. Closest potential option located has been [facility], however during referral process family/pt expressed unable to pay the associated room & board fee if pt admitted there ($180/day). Without active Medicaid, unable to make headway with nursing facility placement in that area. Discussed this with pt's daughter-in-law [phone]. She agreed to have pt referred to hospice facility in [place] area given circumstances. CSW presented local agencies and made referral to [facility]. Noted pt still full code as pt's [facility] [place] ([place]) [phone] has not been available to officially consent.  Will continue working toward disposition plan.   Sharren Bridge, MSW, LCSW Clinical Social Work 09/12/2018 [phone]

## 2018-09-12 NOTE — Progress Notes (Signed)
PROGRESS NOTE  Kristina Mcfarland QMV:[id number] DOB: February 27, 1956 DOA: 08/19/2018 PCP: Willeen Niece, PA   LOS: 23 days   Brief narrative: Patient is a 63 year old woman with PMH relevant for SVT and metastatic breast cancer with extensive involvement of the cervical, thoracic and lumbar spine, diagnosed late December 2019, was receiving chemotherapy, presented with worsening back pain, leg spasms, inability to ambulate.  Patient currently waiting for hospice facility.  Assessment/Plan:  Principal Problem:   Intractable back pain Active Problems:   Bone metastasis ([facility])   Cancer associated pain   Weakness of both lower extremities   Metastatic breast cancer ([facility])   Steroid-induced hyperglycemia   Pancytopenia ([place])  Hospice care: Due to patient's progressive decline including her hypoxia and persistent thrombocytopenia as well as her inability to leave the bed due to severe metastatic breast cancer and back pain, patient and family agreed to transition to hospice.  She has to go to a hospice facility.  Social worker on board, exploring hospice options including 1 near [place] for family wishes.  Patient may need to go to 1 of the local hospice.    Acute hypoxia/chest pressure: CT scan of the chest showed no evidence of pulmonary embolism but did show some opacities concerning for pneumonitis.  Does not appear to be infectious his procalcitonin is quite low. Continue oxygen. patient is already on steroids for widely metastatic breast cancer and back pain  Widely metastatic breast cancer/back pain: Patient has extensive involvement of the cervical, thoracic, lumbar spine on MRI with no evidence of cord compression at this time.  Patient is on symptomatic treatment with MS Contin, gabapentin, dexamethasone, Cymbalta, bupropion and bowel regimen.  Followed by palliative care.  Thrombocytopenia /leukopenia: With bone marrow involvement.  No further lab test indicated.     Fluids:  Tolerating p.o. Electrolytes: Monitor and supplement Nutrition: Regular diet   Disposition: Pending transition to hospice facility availability. Currently remains full code. Her healthcare power of attorney is not available to change her to DNR.  Other family members agree.  VTE Prophylaxis:  SCD   Code Status: Full Code   Antibiotics: Antibiotics Given (last 72 hours)    None     Continuous Infusions: none   Scheduled Meds: . dexamethasone  4 mg Oral Q12H  . DULoxetine  60 mg Oral Daily  . famotidine  20 mg Oral BID  . feeding supplement (ENSURE ENLIVE)  237 mL Oral BID BM  . gabapentin  100 mg Oral QHS  . insulin aspart  0-9 Units Subcutaneous TID WC  . lidocaine  1 patch Transdermal Q24H  . magic mouthwash w/lidocaine  10 mL Oral TID  . morphine  15 mg Oral Q12H  . polyethylene glycol  17 g Oral BID  . senna-docusate  2 tablet Oral BID    PRN meds: acetaminophen **OR** acetaminophen, ALPRAZolam, bisacodyl, HYDROmorphone (DILAUDID) injection, morphine CONCENTRATE, ondansetron **OR** ondansetron (ZOFRAN) IV, sodium phosphate   Subjective: Patient seen and examined this morning.  Pleasant, middle-aged Caucasian female.  Patient stated that it is difficult for her to swallow gabapentin because of its coating.  She complained of severe back pain and wanted some IV pain medications.  Multiple family members were at the bedside.  Objective: Vitals:   09/12/18 0549 09/12/18 1331  BP: 99/81 112/73  Pulse: (!) 111 (!) 106  Resp: 14 16  Temp: 97.8 F (36.6 C) 97.9 F (36.6 C)  SpO2: 99% 96%    Intake/Output Summary (Last 24 hours) at 09/12/2018 1615  Last data filed at 09/12/2018 1300 Gross per 24 hour  Intake 180 ml  Output 350 ml  Net -170 ml   Filed Weights   08/19/18 1848 08/20/18 0437  Weight: 54.4 kg 57.1 kg   Body mass index is 23.02 kg/m.   Physical Exam: GENERAL: Pleasant middle-aged Caucasian female HENT: No scleral pallor or icterus. Pupils equally  reactive to light. Oral mucosa is moist NECK: is supple, no palpable thyroid enlargement. CHEST: Clear to auscultation. No crackles or wheezes. Non tender on palpation. Diminished breath sounds bilaterally. CVS: S1 and S2 heard, no murmur. Regular rate and rhythm. No pericardial rub. ABDOMEN: Soft, non-tender, bowel sounds are present. No palpable hepato-splenomegaly. EXTREMITIES: No edema. CNS: Alert, awake, oriented to place and person.  Patient is occasionally disoriented and confused. SKIN: warm and dry without rashes.  Data Review: I have personally reviewed the laboratory data and studies available.  Barb Merino, MD  [facility] 09/12/2018

## 2018-09-13 LAB — GLUCOSE, CAPILLARY
Glucose-Capillary: 132 mg/dL — ABNORMAL HIGH (ref 70–99)
Glucose-Capillary: 110 mg/dL — ABNORMAL HIGH (ref 70–99)

## 2018-09-13 MED ORDER — ALPRAZOLAM 0.25 MG PO TABS: 0.2500 mg | ORAL_TABLET | Freq: Three times a day (TID) | ORAL | 0 refills | Status: AC | PRN

## 2018-09-13 MED ORDER — MORPHINE SULFATE (CONCENTRATE) 10 MG/0.5ML PO SOLN: 15.0000 mg | ORAL | Status: AC | PRN

## 2018-09-13 NOTE — Plan of Care (Signed)
Patient has no appetite due to illness

## 2018-09-13 NOTE — Discharge Summary (Signed)
Physician Discharge Summary  Kristina Mcfarland MVH:[id number] DOB: 1956/08/11 DOA: 08/19/2018  PCP: Willeen Niece, PA  Admit date: 08/19/2018 Discharge date: 09/13/2018  Admitted From: Home  Discharge disposition: [place]: [employer]   Discharge Diagnosis:   Principal Problem:   Intractable back pain Active Problems:   Bone metastasis ([place])   Cancer associated pain   Weakness of both lower extremities   Metastatic breast cancer ([place])   Steroid-induced hyperglycemia   Pancytopenia ([place])    Discharge Condition: Improved.  Diet recommendation: Low sodium, heart healthy.  Carbohydrate-modified.  Regular.  Wound care: None.  Code status: Full.   History of Present Illness:   Patient is a 63 year old woman with PMH relevant for SVT and metastatic breast cancer with extensive involvement of the cervical, thoracic and lumbar spine, diagnosed late December 2019, was receiving chemotherapy, presented with worsening back pain, leg spasms, inability to ambulate.    Hospital Course:   Hospice care: Due to patient's progressive decline including hypoxia and persistent thrombocytopenia as well as her inability to leave the bed due to severe metastatic breast cancer and back pain, patient and family agreed to transition to hospice.  Palliative care consult appreciated.  Patient is for discharge to hospice facility today.  Acute hypoxia/chest pressure: CT scan of the chest showed no evidence of pulmonary embolism but did show some opacities concerning for pneumonitis. Does not appear to be infectious his procalcitonin is quite low. Continue oxygen. patient is already on steroids for widely metastatic breast cancer and back pain  Widely metastatic breast cancer/back pain: Patient has extensive involvement of the cervical, thoracic, lumbar spine on MRI with no evidence of cord compression at this time.  Patient is on symptomatic treatment with MS Contin, gabapentin,  dexamethasone, Cymbalta, bupropion and bowel regimen.  Thrombocytopenia /leukopenia: With bone marrow involvement.  No further lab test indicated.    For discharge to hospice facility today  Medical Consultants:    Palliative care   Discharge Exam:   Vitals:   09/12/18 2200 09/13/18 0600  BP: 97/74 105/81  Pulse: (!) 115 65  Resp: 16 16  Temp: 98.2 F (36.8 C) 98.4 F (36.9 C)  SpO2: 100% 100%   Vitals:   09/12/18 0549 09/12/18 1331 09/12/18 2200 09/13/18 0600  BP: 99/81 112/73 97/74 105/81  Pulse: (!) 111 (!) 106 (!) 115 65  Resp: 14 16 16 16   Temp: 97.8 F (36.6 C) 97.9 F (36.6 C) 98.2 F (36.8 C) 98.4 F (36.9 C)  TempSrc: Oral Oral Oral Oral  SpO2: 99% 96% 100% 100%  Weight:      Height:        General exam: Not in pain this morning at rest. Respiratory system: Clear to auscultation. Respiratory effort normal. Cardiovascular system: S1 & S2 heard, RRR. No JVD,  rubs, gallops or clicks. No murmurs. Gastrointestinal system: Abdomen is nondistended, soft and nontender. No organomegaly or masses felt. Normal bowel sounds heard. Central nervous system: Alert and oriented. No focal neurological deficits. Extremities: No clubbing,  or cyanosis. No edema. Skin: No rashes, lesions or ulcers. Psychiatry: Judgement and insight appear normal.  Depressed look.   The results of significant diagnostics from this hospitalization (including imaging, microbiology, ancillary and laboratory) are listed below for reference.     Procedures and Diagnostic Studies:   No results found.   Labs:   Basic Metabolic Panel: Recent Labs  Lab 09/07/18 0524 09/08/18 0109 09/12/18 0353  NA 136 137 138  K 3.9 3.9 3.5  CL 103 107 107  CO2 24 21* 22  GLUCOSE 146* 139* 147*  BUN 27* 26* 22  CREATININE <0.30* <0.30* <0.30*  CALCIUM 7.7* 7.6* 7.6*  MG  --  2.4 2.1   GFR CrCl cannot be calculated (This lab value cannot be used to calculate CrCl because it is not a number:  <0.30). Liver Function Tests: Recent Labs  Lab 09/08/18 0109  AST 18  ALT 51*  ALKPHOS 130*  BILITOT 1.5*  PROT 4.6*  ALBUMIN 2.3*   No results for input(s): LIPASE, AMYLASE in the last 168 hours. No results for input(s): AMMONIA in the last 168 hours. Coagulation profile No results for input(s): INR, PROTIME in the last 168 hours.  CBC: Recent Labs  Lab 09/07/18 0524 09/08/18 0109 09/12/18 0353  WBC 1.0* 1.1* 1.1*  NEUTROABS  --   --  1.0*  HGB 9.7* 9.3* 8.9*  HCT 30.2* 28.9* 29.0*  MCV 90.7 92.0 91.5  PLT 36* 35* 34*   Cardiac Enzymes: Recent Labs  Lab 09/07/18 1352 09/07/18 1924 09/08/18 0109  TROPONINI <0.03 <0.03 <0.03   BNP: Invalid input(s): POCBNP CBG: Recent Labs  Lab 09/12/18 0723 09/12/18 1123 09/12/18 1548 09/12/18 2130 09/13/18 0750  GLUCAP 141* 133* 130* 135* 132*   D-Dimer No results for input(s): DDIMER in the last 72 hours. Hgb A1c No results for input(s): HGBA1C in the last 72 hours. Lipid Profile No results for input(s): CHOL, HDL, LDLCALC, TRIG, CHOLHDL, LDLDIRECT in the last 72 hours. Thyroid function studies No results for input(s): TSH, T4TOTAL, T3FREE, THYROIDAB in the last 72 hours.  Invalid input(s): FREET3 Anemia work up No results for input(s): VITAMINB12, FOLATE, FERRITIN, TIBC, IRON, RETICCTPCT in the last 72 hours. Microbiology No results found for this or any previous visit (from the past 240 hour(s)).   Discharge Instructions:   Discharge Instructions    Call MD for:  difficulty breathing, headache or visual disturbances   Complete by:  As directed    Call MD for:  hives   Complete by:  As directed    Call MD for:  persistant dizziness or light-headedness   Complete by:  As directed    Call MD for:  persistant nausea and vomiting   Complete by:  As directed    Call MD for:  redness, tenderness, or signs of infection (pain, swelling, redness, odor or green/yellow discharge around incision site)   Complete by:   As directed    Call MD for:  severe uncontrolled pain   Complete by:  As directed    Call MD for:  temperature >100.4   Complete by:  As directed    Diet - low sodium heart healthy   Complete by:  As directed    Discharge instructions   Complete by:  As directed    Please f/u with your PCP in 1 week.   Increase activity slowly   Complete by:  As directed      Allergies as of 09/13/2018      Reactions   Oxycodone-acetaminophen Nausea And Vomiting   Other    Medication for SVT, Anderal?      Medication List    TAKE these medications   ALPRAZolam 0.25 MG tablet Commonly known as:  XANAX Take 1 tablet (0.25 mg total) by mouth 3 (three) times daily as needed for anxiety or sleep.   ARTHRITIS STRENGTH BC POWDER PO Take 1 Package by mouth daily as needed (arthritis pain).  bisacodyl 5 MG EC tablet Commonly known as:  DULCOLAX Take 1 tablet (5 mg total) by mouth daily as needed for moderate constipation.   buPROPion 150 MG 12 hr tablet Commonly known as:  WELLBUTRIN SR Take 1 tablet (150 mg total) by mouth daily for 30 days.   dexamethasone 4 MG tablet Commonly known as:  DECADRON Take 1 tablet (4 mg total) by mouth every 12 (twelve) hours for 30 days. What changed:    how much to take  when to take this   DULoxetine 60 MG capsule Commonly known as:  CYMBALTA Take 1 capsule (60 mg total) by mouth daily for 30 days.   famotidine 20 MG tablet Commonly known as:  PEPCID Take 1 tablet (20 mg total) by mouth 2 (two) times daily.   gabapentin 100 MG capsule Commonly known as:  NEURONTIN Take 1 capsule (100 mg total) by mouth at bedtime.   letrozole 2.5 MG tablet Commonly known as:  FEMARA Take 1 tablet (2.5 mg total) by mouth daily.   lidocaine 5 % Commonly known as:  LIDODERM Place 1 patch onto the skin daily. Remove & Discard patch within 12 hours or as directed by MD What changed:  Another medication with the same name was added. Make sure you understand how  and when to take each.   lidocaine 5 % Commonly known as:  LIDODERM Place 1 patch onto the skin daily. Remove & Discard patch within 12 hours or as directed by MD What changed:  You were already taking a medication with the same name, and this prescription was added. Make sure you understand how and when to take each.   MELATONIN PO Take 1 tablet by mouth at bedtime as needed (sleep).   morphine 15 MG 12 hr tablet Commonly known as:  MS CONTIN Take 1 tablet (15 mg total) by mouth every 8 (eight) hours for 30 days. What changed:  when to take this   morphine CONCENTRATE 10 MG/0.5ML Soln concentrated solution Take 0.75 mLs (15 mg total) by mouth every 2 (two) hours as needed for severe pain. What changed:  You were already taking a medication with the same name, and this prescription was added. Make sure you understand how and when to take each.   oxyCODONE 15 MG immediate release tablet Commonly known as:  ROXICODONE Take 1 tablet (15 mg total) by mouth every 4 (four) hours as needed for up to 10 days. What changed:  reasons to take this   polyethylene glycol packet Commonly known as:  MIRALAX / GLYCOLAX Take 17 g by mouth 2 (two) times daily.   senna-docusate 8.6-50 MG tablet Commonly known as:  Senokot-S Take 2 tablets by mouth 2 (two) times daily for 30 days.            Durable Medical Equipment  (From admission, onward)         Start     Ordered   09/05/18 1058  For home use only DME Hospital bed  Once    Question Answer Comment  Patient has (list medical condition): breast cancer with bone mets   The above medical condition requires: Patient requires the ability to reposition frequently   Head must be elevated greater than: 30 degrees   Bed type Semi-electric      09/05/18 1058   09/05/18 1057  For home use only DME 3 n 1  Once     09/05/18 1058   09/05/18 1057  For home use  only DME lightweight manual wheelchair with seat cushion  Once    Comments:  Patient  suffers from bone mets which impairs their ability to perform daily activities like walking in the home.  A walker will not resolve  issue with performing activities of daily living. A wheelchair will allow patient to safely perform daily activities. Patient is not able to propel themselves in the home using a standard weight wheelchair due to weakness. Patient can self propel in the lightweight wheelchair.  Accessories: elevating leg rests (ELRs), wheel locks, extensions and anti-tippers.   09/05/18 1058         Time coordinating discharge: 39 minutes  Signed:  Jamorris Ndiaye  [facility] 09/13/2018, 11:29 AM

## 2018-09-13 NOTE — Progress Notes (Signed)
[place]: [employer] Met with pt and spoke to the DIL Heather and Step son clinton over the phone. He is in [place] today for his work. He agrees to the hospice philosophy and wants to continue to focus on comfort care for the pt. She is awake this am having some nausea. Does not want to eat breakfast. She also states her "feet are cold like sitting in a bucket of ice water." I have discussed with them the rapid decline and condition of the pt's status. He feels that all would be in agreement of a DNR. He wants to focus on her being comfortable and pain free. He feels like the decline he has seen it would not do justice to bring her back to continue to suffer in pain. Dr. Domingo Cocking present able to confirm this as well and pt will now be DNR.  We have emailed the paper work to the pt's DIL Nira Conn who will sign them and then email back to [place].  Once we have received the signed consents then we will be able to have transport arranged for pt to go to the [facility] home in [place]. Webb Silversmith RN [phone]

## 2018-09-13 NOTE — Progress Notes (Signed)
Pt admitting to [facility] today.  Arranged transportation via [facility]. Per pt's daughter-in-law, they heard from [place] today that pt will need to do a spend down in order to qualify for Medicaid. Step son and daughter-in-law are going to pursue durable power of attorney in order to access funds to assist with this (pt reports having lost back card and account information). Family still hoping to transfer pt closer to [facility] if she improves/stabilizes, however now accepting that pt is appropriate for residential hospice and likely in last weeks of life. Pt's daughter-in-law thanks CSW and team here for entirety of hospital course of care.  Sharren Bridge, MSW, LCSW Clinical Social Work 09/13/2018 [phone]

## 2018-09-14 LAB — GLUCOSE, CAPILLARY: Glucose-Capillary: 129 mg/dL — ABNORMAL HIGH (ref 70–99)

## 2018-09-14 NOTE — Progress Notes (Signed)
Daily Progress Note   Patient Name: Kristina Mcfarland       Date: 09/14/2018 DOB: 1956/05/02  Age: 63 y.o. MRN#: [id number] Attending Physician: No att. providers found Primary Care Physician: Willeen Niece, [place] Admit Date: 08/19/2018  Reason for Consultation/Follow-up: Establishing goals of care  Subjective: Patient is awake alert but does not seem to have full insight into what is going on.  Noted to have waxing and waning mental status per family.  Discussed care plan with son/HCPOA via phone in conjunction with Cheri from hospice.  Discussed with son regarding heroic interventions at the end-of-life and he agree this would not be in line with prior expressed wishes for a natural death or be beneficial in adding quality time to her life. He is in agreement with changing CODE STATUS to DO NOT RESUSCITATE.   Length of Stay: 24  Current Medications: Scheduled Meds:    Continuous Infusions:   PRN Meds:   Physical Exam          Awake alert Regular work of breathing S 1 S 2  No edema Non focal  Vital Signs: BP 101/74 (BP Location: Left Arm)   Pulse 99   Temp 97.7 F (36.5 C) (Oral)   Resp 13   Ht 5\' 2"  (1.575 m)   Wt 57.1 kg   SpO2 99%   BMI 23.02 kg/m  SpO2: SpO2: 99 % O2 Device: O2 Device: Nasal Cannula O2 Flow Rate: O2 Flow Rate (L/min): 2 L/min  Intake/output summary:   Intake/Output Summary (Last 24 hours) at 09/14/2018 0912 Last data filed at 09/13/2018 1737 Gross per 24 hour  Intake -  Output 200 ml  Net -200 ml   LBM: Last BM Date: 09/12/18 Baseline Weight: Weight: 54.4 kg Most recent weight: Weight: 57.1 kg      PPS 40% Palliative Assessment/Data:    Flowsheet Rows     Most Recent Value  Intake Tab  Referral Department  Hospitalist  Unit at Time of  Referral  Oncology Unit  Palliative Care Primary Diagnosis  Cancer  Date Notified  08/20/18  Palliative Care Type  Return patient Palliative Care  Reason for referral  Non-pain Symptom, Clarify Goals of Care  Date of Admission  08/20/18  Date first seen by Palliative Care  08/21/18  # of days Palliative referral  response time  1 Day(s)  # of days IP prior to Palliative referral  0  Clinical Assessment  Psychosocial & Spiritual Assessment  Palliative Care Outcomes      Patient Active Problem List   Diagnosis Date Noted  . Steroid-induced hyperglycemia 08/31/2018  . Pancytopenia ([place]) 08/31/2018  . Metastatic breast cancer ([place])   . Intractable back pain 08/20/2018  . Weakness of both lower extremities 08/20/2018  . Palliative care by specialist   . Goals of care, counseling/discussion   . Cancer associated pain   . Constipation   . Bone metastasis ([place]) 08/08/2018  . Paroxysmal SVT (supraventricular tachycardia) ([place]) 08/08/2018  . LFT elevation 08/08/2018  . Back pain 08/08/2018    Palliative Care Assessment & Plan   Patient Profile:    Assessment:  63 year old woman PMH relevant for SVT and metastatic breast cancer with extensive involvement of the cervical, thoracic and lumbar spine, diagnosed late December 2019, currently receiving chemotherapy, presented with worsening back pain, leg spasms, inability to ambulate.   Recommendations/Plan: - Continue current pain and non pain symptom management medications. - DNR/DNI - To residential hospice today.  Prognosis:   Weeks?  Discharge Planning: Hospice facility   Care plan was discussed with family at bedside.   Thank you for allowing the Palliative Medicine Team to assist in the care of this patient.   Total Time 20 Prolonged Time Billed  no       Greater than 50%  of this time was spent counseling and coordinating care related to the above assessment and plan.  Micheline Rough, MD  Please contact Palliative  Medicine Team phone at [phone] for questions and concerns.

## 2018-10-13 DEATH — deceased

## 2018-10-28 ENCOUNTER — Ambulatory Visit: Payer: Self-pay | Admitting: Radiation Oncology

## 2020-01-16 IMAGING — CT CT ANGIO CHEST
2 of 6 series · 18 of 46 positions shown · IV contrast (APPLIED)
Comparison: 08/08/2018

CLINICAL DATA: Shortness of breath.  Metastatic breast cancer

EXAM:
CT ANGIOGRAPHY CHEST WITH CONTRAST
TECHNIQUE: Multidetector CT imaging of the chest was performed using the
standard protocol during bolus administration of intravenous
contrast. Multiplanar CT image reconstructions and MIPs were
obtained to evaluate the vascular anatomy.
CONTRAST:  100mL IHITTG-BFE IOPAMIDOL (IHITTG-BFE) INJECTION 76%

[Series 5: thins · axial · 0.66mm/px · z∈[+750,+974]mm · 16 of 246 slices shown]
[im 11/246  lung]
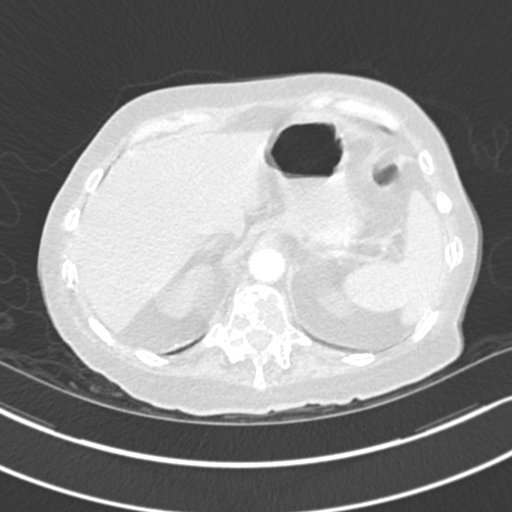
[im 32/246  soft-tissue]
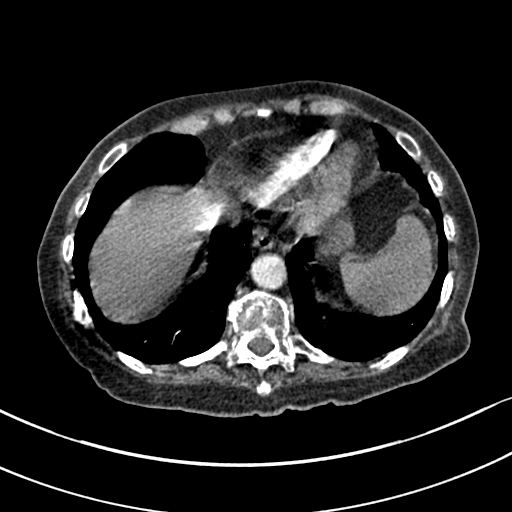
[im 43/246  lung]
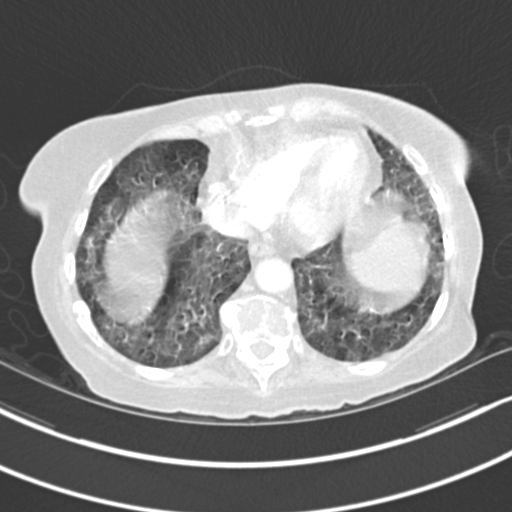
[im 54/246  soft-tissue]
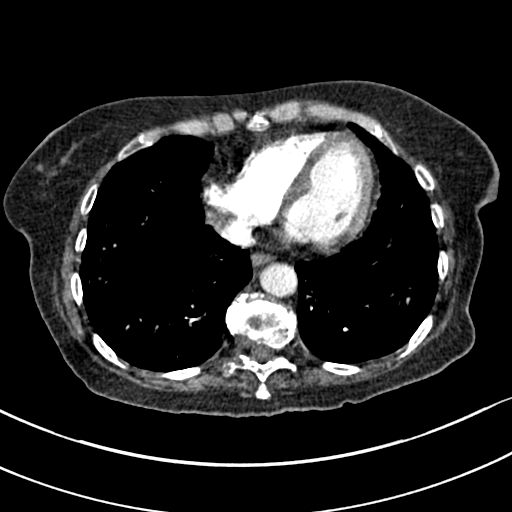
[im 75/246  lung]
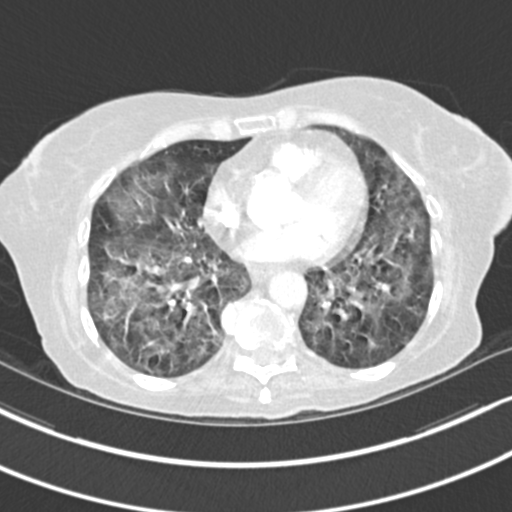
[im 86/246  soft-tissue]
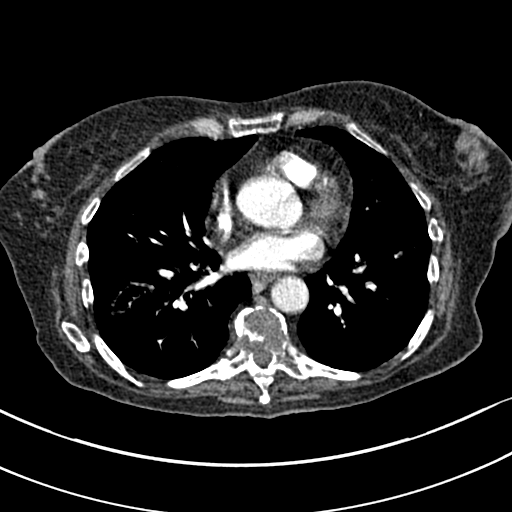
[im 96/246  lung]
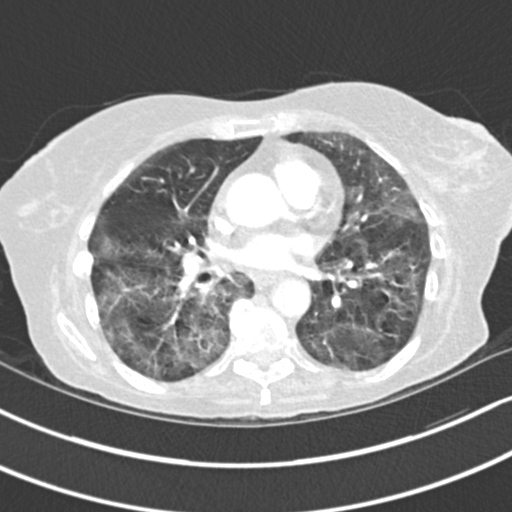
[im 118/246  soft-tissue]
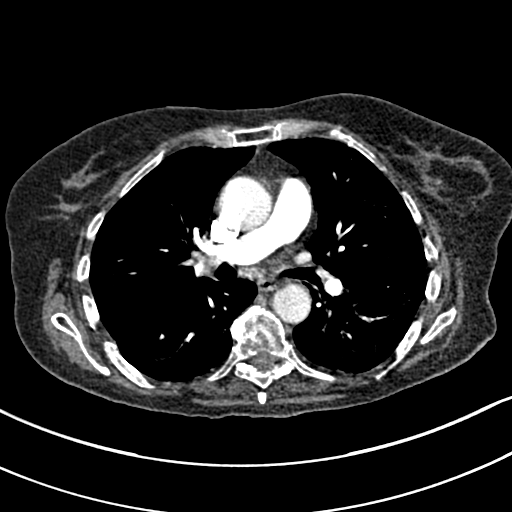
[im 128/246  lung]
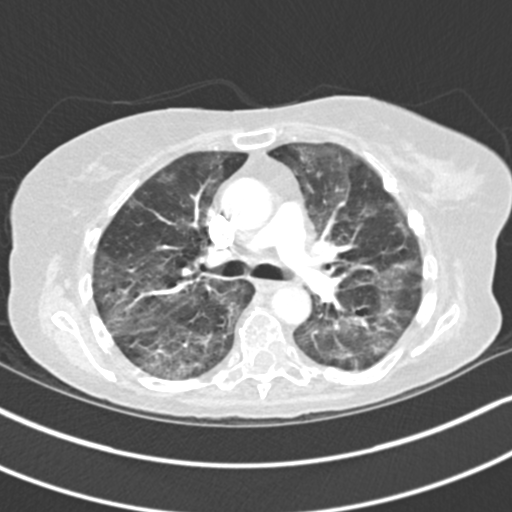
[im 150/246  soft-tissue]
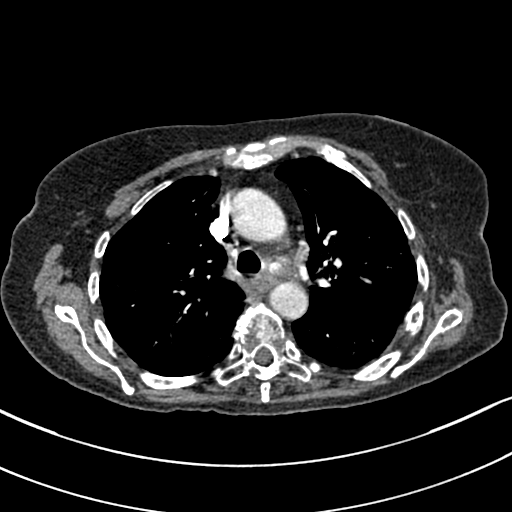
[im 160/246  lung]
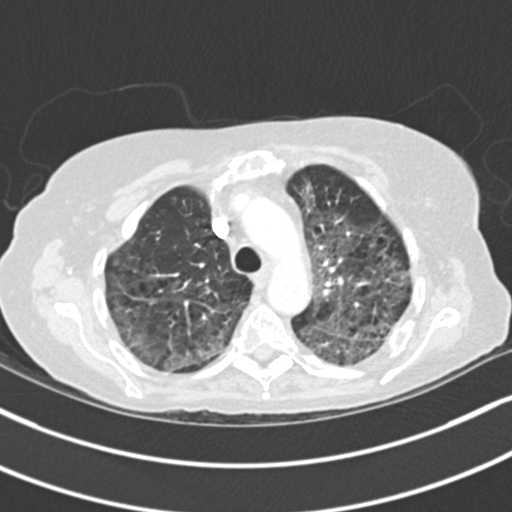
[im 171/246  soft-tissue]
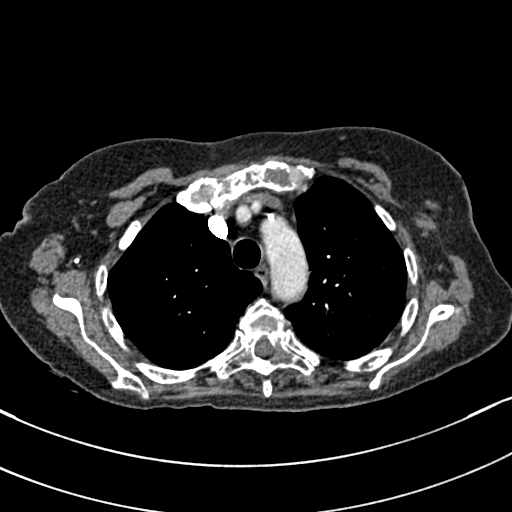
[im 192/246  lung]
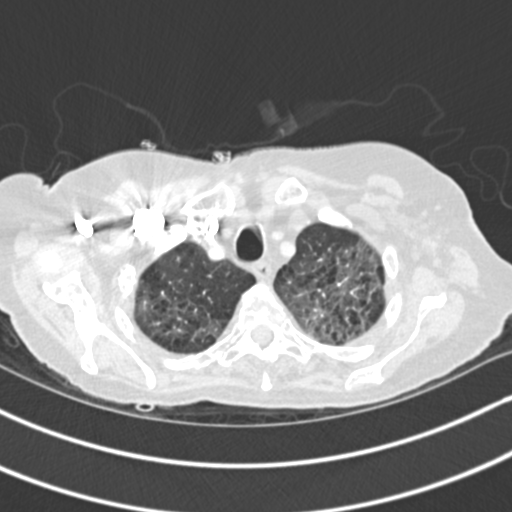
[im 203/246  soft-tissue]
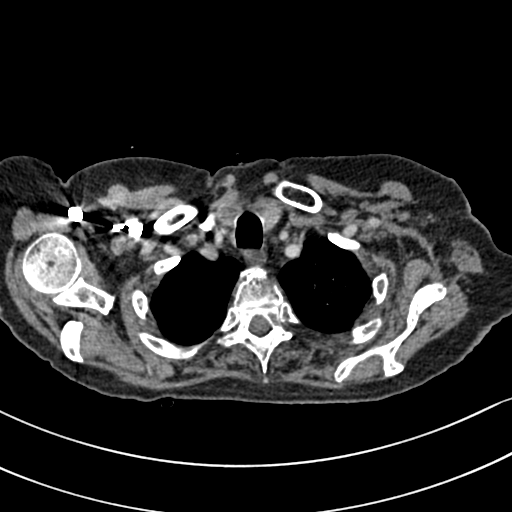
[im 214/246  lung]
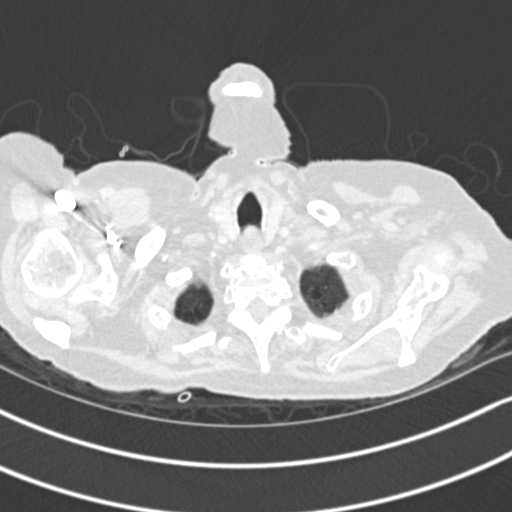
[im 235/246  soft-tissue]
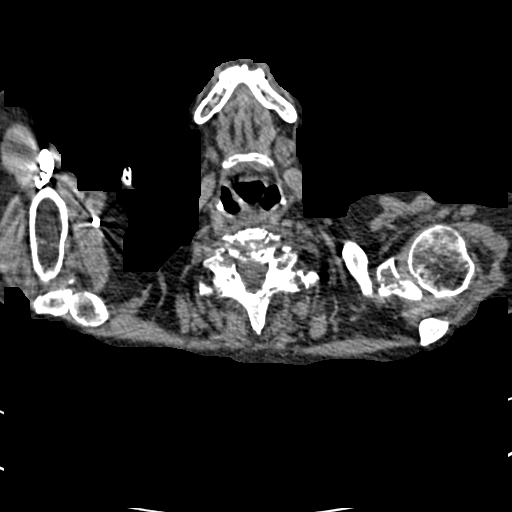

[Series 7: coronal mpr · coronal · 0.48mm/px · 2 of 71 slices shown]
[im 24/71  soft-tissue]
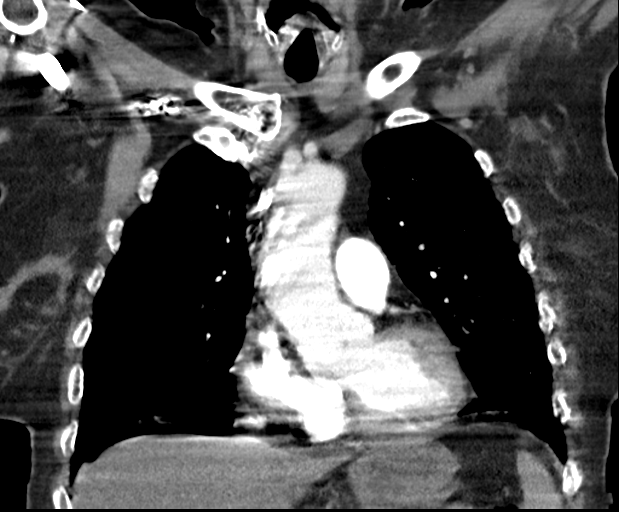
[im 47/71  soft-tissue]
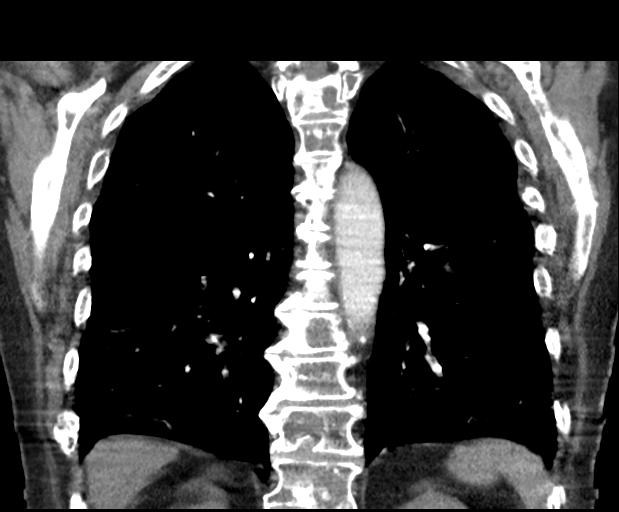

[18 of 46 positions shown; findings below may reference images not displayed]

FINDINGS: Cardiovascular: Normal heart size. No pericardial effusion.
Satisfactory opacification of the pulmonary arteries but
significantly limited by motion artifact in this short of breath
patient. No pulmonary embolism is seen when accounting for artifact.

Mediastinum/Nodes: Mildly decreased size of mediastinal adenopathy.

Lungs/Pleura: Patchy generalized ground-glass densities superimposed
on emphysema. No air bronchogram, effusion, or pneumothorax.

Upper Abdomen: No acute finding

Musculoskeletal: Generalized osseous metastatic disease with
sclerosis and erosion throughout the skeleton. T1 and T3 collapse
that is similar to prior. There is multifocal cortical erosion along
the spinal canal; there was recent spinal MRI

Other: Known left breast mass and left axillary adenopathy. The
axillary mass has decreased in bulk since prior.

Review of the MIP images confirms the above findings.
IMPRESSION: 1. Significant motion degradation with no evidence of pulmonary
embolism.
2. Generalized patchy ground-glass opacity superimposed on
emphysema. Atypical infection, drug reaction, or other inflammatory
pneumonitis are primary considerations
